# Patient Record
Sex: Male | Born: 1945 | Race: White | Hispanic: No | Smoking: Never smoker
Health system: Southern US, Community
[De-identification: ages and names within clinical notes are randomized; demographics above are authoritative.]

## PROBLEM LIST (undated history)

## (undated) DIAGNOSIS — Z978 Presence of other specified devices: Secondary | ICD-10-CM

## (undated) DIAGNOSIS — I219 Acute myocardial infarction, unspecified: Secondary | ICD-10-CM

## (undated) DIAGNOSIS — F319 Bipolar disorder, unspecified: Secondary | ICD-10-CM

## (undated) DIAGNOSIS — Z950 Presence of cardiac pacemaker: Secondary | ICD-10-CM

## (undated) DIAGNOSIS — I2510 Atherosclerotic heart disease of native coronary artery without angina pectoris: Secondary | ICD-10-CM

## (undated) DIAGNOSIS — I10 Essential (primary) hypertension: Secondary | ICD-10-CM

## (undated) DIAGNOSIS — I4589 Other specified conduction disorders: Secondary | ICD-10-CM

## (undated) DIAGNOSIS — F329 Major depressive disorder, single episode, unspecified: Secondary | ICD-10-CM

## (undated) DIAGNOSIS — F0390 Unspecified dementia without behavioral disturbance: Secondary | ICD-10-CM

## (undated) DIAGNOSIS — I77810 Thoracic aortic ectasia: Secondary | ICD-10-CM

## (undated) DIAGNOSIS — I441 Atrioventricular block, second degree: Secondary | ICD-10-CM

## (undated) DIAGNOSIS — E785 Hyperlipidemia, unspecified: Secondary | ICD-10-CM

## (undated) DIAGNOSIS — I4892 Unspecified atrial flutter: Secondary | ICD-10-CM

## (undated) DIAGNOSIS — F32A Depression, unspecified: Secondary | ICD-10-CM

## (undated) DIAGNOSIS — I4510 Unspecified right bundle-branch block: Secondary | ICD-10-CM

## (undated) HISTORY — DX: Atrioventricular block, second degree: I44.1

## (undated) HISTORY — DX: Hyperlipidemia, unspecified: E78.5

## (undated) HISTORY — DX: Essential (primary) hypertension: I10

## (undated) HISTORY — PX: KNEE SURGERY: SHX244

## (undated) HISTORY — DX: Unspecified atrial flutter: I48.92

## (undated) HISTORY — DX: Thoracic aortic ectasia: I77.810

## (undated) HISTORY — DX: Other specified conduction disorders: I45.89

## (undated) HISTORY — DX: Unspecified right bundle-branch block: I45.10

---
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
## 2012-01-16 ENCOUNTER — Ambulatory Visit (HOSPITAL_COMMUNITY): Payer: Self-pay | Admitting: Physical Therapy

## 2012-01-17 ENCOUNTER — Ambulatory Visit (HOSPITAL_COMMUNITY)
Admission: RE | Admit: 2012-01-17 | Discharge: 2012-01-17 | Disposition: A | Discharge status: Home / Self Care | Payer: Non-veteran care | Source: Ambulatory Visit | Attending: Physician Assistant | Admitting: Physician Assistant

## 2012-01-17 DIAGNOSIS — M25569 Pain in unspecified knee: Secondary | ICD-10-CM | POA: Insufficient documentation

## 2012-01-17 DIAGNOSIS — IMO0001 Reserved for inherently not codable concepts without codable children: Secondary | ICD-10-CM | POA: Insufficient documentation

## 2012-01-17 DIAGNOSIS — R262 Difficulty in walking, not elsewhere classified: Secondary | ICD-10-CM | POA: Insufficient documentation

## 2012-01-17 DIAGNOSIS — M25669 Stiffness of unspecified knee, not elsewhere classified: Secondary | ICD-10-CM | POA: Insufficient documentation

## 2012-01-17 NOTE — Evaluation (Signed)
Physical Therapy Evaluation  Patient Details  Name: Mario Massey MRN: [id number] Date of Birth: Oct 31, 1945  Today's Date: 01/17/2012 Time: 1113-1155 PT Time Calculation (min): 42 min  Visit#: 1  of 12   Re-eval: 02/16/12 Assessment Diagnosis: TKR Surgical Date: 12/19/11 Next MD Visit: 02/11/2101 Prior Therapy: HH  Authorization: [facility]  Authorization Time Period: 2-3x a week for 4-6 weeks    Past Medical History: No past medical history on file. Past Surgical History: No past surgical history on file.  Subjective Symptoms/Limitations Symptoms: Mario Massey states that he has quit taking pain medication.  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 Pertinent History: Pt carries Nitro How long can you sit comfortably?: an hour How long can you stand comfortably?: 20 minutes How long can you walk comfortably?: 5-10 minutes  Patient Stated Goals: To get on a walking program Pain Assessment Currently in Pain?: No/denies (5/10 is the max pain) Pain Orientation: Right Pain Type: Surgical pain    Prior Functioning  Home Living Lives With: Alone Type of Home: Apartment Home Access: Stairs to enter Entrance Stairs-Number of Steps: 3 Entrance Stairs-Rails: Right Prior Function Level of Independence: Independent with basic ADLs Able to Take Stairs?: No Driving: Yes Vocation: On disability Leisure: Hobbies-no  Cognition/Observation Cognition Overall Cognitive Status: Appears within functional limits for tasks assessed   Assessment RLE AROM (degrees) Right Knee Extension: 10  Right Knee Flexion: 102  RLE Strength Right Hip Flexion: 5/5 Right Hip Extension: 3/5 Right Hip ABduction: 4/5 Right Knee Flexion: 4/5 Right Knee Extension: 5/5 Right Ankle Dorsiflexion: 4/5  Exercise/Treatments    Stretches Active  Hamstring Stretch: 3 reps;30 seconds   Standing Heel Raises: 10 reps Seated Long Arc Quad: 10 reps Supine Quad Sets: 10 reps Terminal Knee Extension: 10 reps  Prone  Hamstring Curl: 10 reps Hip Extension: 10 reps   Physical Therapy Assessment and Plan PT Assessment and Plan Clinical Impression Statement: Pt with decreased ROM, decreased strength  following a TKR who will benefit from skilled therapy to improve functional activity tolerance and quality of life Pt will benefit from skilled therapeutic intervention in order to improve on the following deficits: Decreased activity tolerance;Decreased balance;Decreased range of motion;Decreased strength;Pain;Difficulty walking Rehab Potential: Good PT Frequency: Min 2X/week PT Duration: 6 weeks PT Treatment/Interventions: Gait training;Stair training;Therapeutic activities;Therapeutic exercise;Manual techniques;Modalities PT Plan: Begin Bike, rockerboard, standing flex, heel raise, minisquat, Standing and supine terminal extension and SLS next treatment.  3rd begin lateral and forward step ups.    Goals Home Exercise Program Pt will Perform Home Exercise Program: Independently PT Short Term Goals Time to Complete Short Term Goals: 2 weeks PT Short Term Goal 1: Pt ROM to be to 5 to improve gait PT Short Term Goal 2: strength to be increased by 1/2 grade PT Short Term Goal 3: Pt to be able to be able to stand for 15 minutes without difficulty PT Short Term Goal 4: Pt to be able to walk for 15 minutes PT Long Term Goals Time to Complete Long Term Goals:  (6 weeks) PT Long Term Goal 1: I in advance HEP PT Long Term Goal 2: ROM to be 3-115 degrees to allow pt to squat down and improve ambulation Long Term Goal 3: Pt to be able to stand for 20 minutes. Long Term Goal 4: Pt to be ambulating for 30 mintues  at time. PT Long Term Goal 5: able to go up and down steps reciprocally  Problem List Patient Active Problem List  Diagnosis  .  Stiffness of knee joint  . Difficulty in walking    PT - End of Session Activity Tolerance: Patient tolerated treatment well General Behavior During Session: [facility] for tasks performed PT Plan of Care PT Home Exercise Plan: given  GP    Gerry Blanchfield,CINDY 01/17/2012, 12:21 PM  Physician Documentation Your signature is required to indicate approval of the treatment plan as stated above.  Please sign and either send electronically or make a copy of this report for your files and return this physician signed original.   Please mark one 1.__approve of plan  2. ___approve of plan with the following conditions.   ______________________________                                                          _____________________ Physician Signature                                                                                                             Date

## 2012-01-20 ENCOUNTER — Inpatient Hospital Stay (HOSPITAL_COMMUNITY): Admission: RE | Admit: 2012-01-20 | Payer: Non-veteran care | Source: Ambulatory Visit | Admitting: *Deleted

## 2012-01-28 ENCOUNTER — Ambulatory Visit (HOSPITAL_COMMUNITY)
Admission: RE | Admit: 2012-01-28 | Discharge: 2012-01-28 | Disposition: A | Discharge status: Home / Self Care | Payer: Non-veteran care | Source: Ambulatory Visit | Attending: Physician Assistant | Admitting: Physician Assistant

## 2012-01-28 DIAGNOSIS — R262 Difficulty in walking, not elsewhere classified: Secondary | ICD-10-CM

## 2012-01-28 DIAGNOSIS — M25669 Stiffness of unspecified knee, not elsewhere classified: Secondary | ICD-10-CM

## 2012-01-28 NOTE — Progress Notes (Signed)
Physical Therapy Treatment Patient Details  Name: Mario Massey MRN: [id number] Date of Birth: 1945/02/20  Today's Date: 01/28/2012 Time: 7829-5621 PT Time Calculation (min): 63 min  Visit#: 2  of 12   Re-eval: 02/14/12    Authorization: [facility]  Authorization Time Period: 2-3 times a week for 4-6 weeks  Authorization Visit#:   of     Subjective: Symptoms/Limitations Symptoms: My knee is doing great.  My left knee is hurting more than my right Pain Assessment Currently in Pain?: No/denies Pain Orientation: Right   Exercise/Treatments Active Hamstring Stretch: 3 reps;30 seconds Standing Heel Raises: 10 reps Knee Flexion: 10 reps Terminal Knee Extension: Strengthening;15 reps;Theraband Theraband Level (Terminal Knee Extension): Level 4 (Blue) Functional Squat: 10 reps Rocker Board: 2 minutes SLS: 2 sec max.  5 rep Seated Long Arc Quad: 10 reps;Weights Long Arc Quad Weight: 3 lbs. Supine Quad Sets: 10 reps Heel Slides: 10 reps Terminal Knee Extension: 10 reps Knee Extension: PROM Prone  Hamstring Curl: 10 reps;Limitations Hamstring Curl Limitations: 3# Hip Extension: 10 reps;Limitations Hip Extension Limitations: 3#   Modalities Modalities: Cryotherapy Cryotherapy Number Minutes Cryotherapy: 10 Minutes Cryotherapy Location: Knee Type of Cryotherapy: Ice pack  Physical Therapy Assessment and Plan PT Assessment and Plan Clinical Impression Statement: Pt demonstrates improved ROM; significant decreased balance and tolerance to activity. PT Plan: begin lateral and forward stpe ups     Problem List Patient Active Problem List  Diagnosis  . Stiffness of knee joint  . Difficulty in walking    PT - End of Session Activity Tolerance: Patient tolerated treatment well General Behavior During Session: [facility] for tasks performed Cognition: [facility] for tasks performed  GP    RUSSELL,CINDY 01/28/2012, 3:57 PM

## 2012-01-30 ENCOUNTER — Ambulatory Visit (HOSPITAL_COMMUNITY): Payer: Non-veteran care | Admitting: *Deleted

## 2012-02-04 ENCOUNTER — Ambulatory Visit (HOSPITAL_COMMUNITY)
Admission: RE | Admit: 2012-02-04 | Discharge: 2012-02-04 | Disposition: A | Discharge status: Home / Self Care | Payer: Non-veteran care | Source: Ambulatory Visit | Attending: Physician Assistant | Admitting: Physician Assistant

## 2012-02-04 DIAGNOSIS — M25669 Stiffness of unspecified knee, not elsewhere classified: Secondary | ICD-10-CM | POA: Insufficient documentation

## 2012-02-04 DIAGNOSIS — IMO0001 Reserved for inherently not codable concepts without codable children: Secondary | ICD-10-CM | POA: Insufficient documentation

## 2012-02-04 DIAGNOSIS — M25569 Pain in unspecified knee: Secondary | ICD-10-CM | POA: Insufficient documentation

## 2012-02-04 DIAGNOSIS — R262 Difficulty in walking, not elsewhere classified: Secondary | ICD-10-CM | POA: Insufficient documentation

## 2012-02-04 NOTE — Progress Notes (Addendum)
Physical Therapy Treatment Patient Details  Name: Mario Massey MRN: [id number] Date of Birth: August 07, 1945  Today's Date: 02/04/2012 Time: 1105-1208 PT Time Calculation (min): 63 min Charge: there ex 43; IP x 10 Visit#: 3  of 12   Re-eval: 02/14/12    Authorization: [facility]  Authorization Time Period: 2-3 x wk x 4-6 weeks  Authorization Visit#:   of     Subjective: Symptoms/Limitations Symptoms: Pt states that he noticed that it is easier to get in and out of a car now.  Pt states that he went to the [place] and found out her was low on iron and he needed to lower the dosage of his blood pressure medicaion. Pain Assessment Currently in Pain?: No/denies   Exercise/Treatments   Aerobic Stationary Bike: 3.0 x 8' Standing Heel Raises: 15 reps Knee Flexion: 10 reps;Limitations Knee Flexion Limitations: 5# Terminal Knee Extension: Strengthening;10 reps;Theraband Lateral Step Up: 10 reps Forward Step Up: 10 reps Functional Squat: 10 reps Rocker Board: 2 minutes SLS: 6 sec max x 5 (6 sec x 5) Seated Long Arc Quad: 10 reps Long Arc Quad Weight: 5 lbs. Supine Quad Sets: 10 reps Heel Slides: 10 reps Terminal Knee Extension: 10 reps Knee Flexion: PROM Prone  Hamstring Curl: 10 reps Hamstring Curl Limitations: 5# Hip Extension: 5 reps Hip Extension Limitations: 5 xw/0# 5 w/5#   Modalities Modalities: Cryotherapy Cryotherapy Number Minutes Cryotherapy: 10 Minutes Cryotherapy Location: Knee  Physical Therapy Assessment and Plan PT Assessment and Plan Clinical Impression Statement: ROM 0-110; pt main concern is balance at this time PT Frequency: Min 2X/week PT Plan: begin steps in the department and tandem stance     Goals Home Exercise Program PT Goal: Perform Home Exercise Program - Progress: Met PT Short Term Goals PT Short Term Goal 1 - Progress: Met PT Short Term Goal 2 - Progress: Met PT Short Term Goal 3 - Progress: Met PT Short Term Goal 4 - Progress: Progressing  toward goal PT Long Term Goals PT Long Term Goal 1 - Progress: Met PT Long Term Goal 2 - Progress: Progressing toward goal Long Term Goal 3 Progress: Progressing toward goal Long Term Goal 4 Progress: Progressing toward goal Long Term Goal 5 Progress: Progressing toward goal  Problem List Patient Active Problem List  Diagnosis  . Stiffness of knee joint  . Difficulty in walking    PT - End of Session Activity Tolerance: Patient tolerated treatment well General Behavior During Session: [facility] for tasks performed Cognition: [facility] for tasks performed  GP    RUSSELL,CINDY 02/04/2012, 12:02 PM

## 2012-02-06 ENCOUNTER — Ambulatory Visit (HOSPITAL_COMMUNITY)
Admission: RE | Admit: 2012-02-06 | Discharge: 2012-02-06 | Disposition: A | Discharge status: Home / Self Care | Payer: Non-veteran care | Source: Ambulatory Visit | Attending: Physician Assistant | Admitting: Physician Assistant

## 2012-02-06 NOTE — Progress Notes (Signed)
Physical Therapy Treatment Patient Details  Name: Mario Massey MRN: [id number] Date of Birth: February 26, 1945  Today's Date: 02/06/2012 Time: 0865-7846 PT Time Calculation (min): 53 min Charges: 54' TE, 1 ice Visit#: 4  of 12   Re-eval: 02/14/12    Authorization: [facility]  Authorization Time Period: 2-3 x wk x 4-6 weeks  Authorization Visit#:   of     Subjective: Symptoms/Limitations Symptoms: Pt reports that he had a gout flare up for 2 days and was able to take medication and now he feels marvelous.   Pain Assessment Currently in Pain?: No/denies  Precautions/Restrictions     Exercise/Treatments Machines for Strengthening Cybex Knee Extension: 2PL ecc lower w/R 2x10 Cybex Knee Flexion: 4 PL BLE 2x10 Standing Lateral Step Up: Right;10 reps;Hand Hold: 0;Step Height: 4" Forward Step Up: Right;10 reps;Hand Hold: 0;Step Height: 6" Step Down: Right;10 reps;Hand Hold: 0;Step Height: 4" Functional Squat: Limitations;15 reps Functional Squat Limitations: w/green ball Rocker Board: 2 minutes SLS: 3x30 sec intermittent HHA  Best time 10 sec  Other Standing Knee Exercises: Tandem Stance 2x30 sec; Tandem gait and retro gait 2 RT Sidelying Hip ABduction: Right;10 reps;Limitations Hip ABduction Limitations: 2# w/manual facilitation for proper movement Prone  Hamstring Curl: 15 reps Hamstring Curl Limitations: 6# Hip Extension: Right;10 reps   Modalities Modalities: Cryotherapy Cryotherapy Number Minutes Cryotherapy: 10 Minutes Cryotherapy Location: Knee  Physical Therapy Assessment and Plan PT Assessment and Plan Clinical Impression Statement: Added activities to improve confidence with balance and improve LE functional strength. Able to complete all activities without increased pain. Able to demo R SLS x10 sec on static surface.  PT Frequency: Min 2X/week PT Plan: Continue to progress towards functional goals.     Goals    Problem List Patient Active Problem List  Diagnosis    . Stiffness of knee joint  . Difficulty in walking    PT - End of Session Activity Tolerance: Patient tolerated treatment well General Behavior During Session: [facility] for tasks performed Cognition: [facility] for tasks performed PT Plan of Care PT Patient Instructions: discussed importance of HEP 2x/day Consulted and Agree with Plan of Care: Patient  GP    Nivan Melendrez 02/06/2012, 11:55 AM

## 2012-02-11 ENCOUNTER — Ambulatory Visit (HOSPITAL_COMMUNITY)
Admission: RE | Admit: 2012-02-11 | Discharge: 2012-02-11 | Disposition: A | Discharge status: Home / Self Care | Payer: Non-veteran care | Source: Ambulatory Visit | Attending: Physician Assistant | Admitting: Physician Assistant

## 2012-02-11 DIAGNOSIS — M25669 Stiffness of unspecified knee, not elsewhere classified: Secondary | ICD-10-CM

## 2012-02-11 DIAGNOSIS — R262 Difficulty in walking, not elsewhere classified: Secondary | ICD-10-CM

## 2012-02-11 NOTE — Progress Notes (Signed)
Physical Therapy Treatment Patient Details  Name: Georgie Haque MRN: [id number] Date of Birth: [birth year]  Today's Date: 02/11/2012 Time: 1106-1212 PT Time Calculation (min): 66 min Charge:  There ex x 53; IP x 10 Visit#: 5  of 12   Re-eval: 02/14/12   Authorization: [facility]  Authorization Time Period: 2-3 x wk x 4-6 wk   Subjective: Symptoms/Limitations Symptoms: Pt concerned about his ROM; states he is having some hip pain today and not sure why Pain Assessment Currently in Pain?: Yes Pain Score: 0-No pain   Exercise/Treatments Stretches Quad Stretch: Limitations Lobbyist Limitations: chair stretch x 30" x 3 Aerobic Stationary Bike: 3.0 x 6'   Standing Heel Raises: 15 reps Knee Flexion: 15 reps;Limitations Knee Flexion Limitations: 5# Lateral Step Up: Right;10 reps;Hand Hold: 0;Step Height: 6" Forward Step Up: Right;10 reps;Hand Hold: 0;Step Height: 6" Rocker Board: 2 minutes SLS with Vectors: 10" x 3 Other Standing Knee Exercises: Tandem Stance 2x30 sec; Tandem gait and retro gait 2 RT   Supine Quad Sets: 15 reps Heel Slides: 15 reps Terminal Knee Extension: 15 reps Knee Extension: PROM Knee Flexion: PROM  Physical Therapy Assessment and Plan PT Assessment and Plan Clinical Impression Statement: Pt concerned about ROM and balance; ROM 5-118 explained that this is very good.   PT Plan: PT to begin walking up steps.    Goals Home Exercise Program PT Goal: Perform Home Exercise Program - Progress: Met PT Short Term Goals PT Short Term Goal 1 - Progress: Met PT Short Term Goal 2 - Progress: Met PT Short Term Goal 3 - Progress: Met PT Short Term Goal 4 - Progress: Met PT Long Term Goals PT Long Term Goal 1 - Progress: Met PT Long Term Goal 2 - Progress: Progressing toward goal Long Term Goal 3 Progress: Met Long Term Goal 4 Progress: Progressing toward goal Long Term Goal 5 Progress: Progressing toward goal  Problem List Patient Active Problem List    Diagnosis  . Stiffness of knee joint  . Difficulty in walking    PT - End of Session Activity Tolerance: Patient tolerated treatment well General Behavior During Session: [facility] for tasks performed Cognition: [facility] for tasks performed  GP    RUSSELL,CINDY 02/11/2012, 12:07 PM

## 2012-02-13 ENCOUNTER — Ambulatory Visit (HOSPITAL_COMMUNITY)
Admission: RE | Admit: 2012-02-13 | Discharge: 2012-02-13 | Disposition: A | Discharge status: Home / Self Care | Payer: Non-veteran care | Source: Ambulatory Visit | Attending: Physician Assistant | Admitting: Physician Assistant

## 2012-02-13 DIAGNOSIS — M25669 Stiffness of unspecified knee, not elsewhere classified: Secondary | ICD-10-CM

## 2012-02-13 DIAGNOSIS — R262 Difficulty in walking, not elsewhere classified: Secondary | ICD-10-CM

## 2012-02-13 NOTE — Progress Notes (Signed)
Physical Therapy Treatment Patient Details  Name: Mario Massey MRN: [id number] Date of Birth: April 18, 1945  Today's Date: 02/13/2012 Time: 1104-1208 PT Time Calculation (min): 64 min  Visit#: 6  of 12   Re-eval: 02/17/12  charge there ex x 53; IP x 10  Authorization: [facility]  Authorization Time Period: 2x week x 6 week  Authorization Visit#:   of     Subjective: Symptoms/Limitations Symptoms: Pt states his balance is still the most limiting aspect.   Exercise/Treatments  Stretches Lobbyist: 3 reps;30 seconds;Limitations (w/rope and on chair) Lobbyist Limitations: chair stretch x 30" x 3 Aerobic Stationary Bike: 3.0 x 7' Standing Heel Raises: 15 reps Knee Flexion: 15 reps;Limitations Knee Flexion Limitations: 5# Lateral Step Up: Right;10 reps;Hand Hold: 0;Step Height: 6" Forward Step Up: Right;10 reps;Hand Hold: 0;Step Height: 6" Rocker Board: 2 minutes SLS: 5x 10" SLS with Vectors: 10" x 3 Other Standing Knee Exercises: Tandem Stance 2x30 sec; Tandem gait and retro gait 2 RT    Supine Quad Sets: 15 reps Heel Slides: 15 reps Terminal Knee Extension: 15 reps Knee Extension: PROM Knee Flexion: PROM   Prone  Hip Extension: 15 reps  Modalities Modalities: Cryotherapy Cryotherapy Number Minutes Cryotherapy: 15 Minutes Cryotherapy Location: Knee Type of Cryotherapy: Ice pack  Physical Therapy Assessment and Plan PT Assessment and Plan Clinical Impression Statement: Pt completed steps reciprocally today with slight difficulty on descent.  Pt slowly  improving with balance.  Pt still has decreased hip extensor strength. PT Plan: continue with balance begin tandem walking; sit to stand        Problem List Patient Active Problem List  Diagnosis  . Stiffness of knee joint  . Difficulty in walking    PT - End of Session Activity Tolerance: Patient tolerated treatment well General Behavior During Session: [facility] for tasks performed Cognition: [facility] for tasks  performed PT Plan of Care PT Home Exercise Plan: given  GP    Guelda Batson,CINDY 02/13/2012, 12:03 PM

## 2012-02-18 ENCOUNTER — Ambulatory Visit (HOSPITAL_COMMUNITY)
Admission: RE | Admit: 2012-02-18 | Discharge: 2012-02-18 | Disposition: A | Discharge status: Home / Self Care | Payer: Non-veteran care | Source: Ambulatory Visit | Attending: Physician Assistant | Admitting: Physician Assistant

## 2012-02-18 NOTE — Progress Notes (Signed)
Physical Therapy Treatment Patient Details  Name: Mario Massey MRN: [id number] Date of Birth: 02-20-1945  Today's Date: 02/18/2012 Time: 1106-1200 PT Time Calculation (min): 54 min  Visit#: 7  of 12   Re-eval: 02/17/12 Charges: Therex x 30' NMR x 10' Ice x 10'  Authorization: [facility]  Authorization Time Period: 2x week x 6 week    Subjective: Symptoms/Limitations Symptoms: Pt reports HEP compliance. Pain Assessment Currently in Pain?: No/denies   Exercise/Treatments  Stretches Lobbyist Limitations: chair stretch 3x30" Aerobic Stationary Bike: 8'@3 .0 seat 11 for strength and ROM Standing Heel Raises: 20 reps Lateral Step Up: 15 reps;Right;Step Height: 6" Forward Step Up: 15 reps;Right;Step Height: 6" Rocker Board: 2 minutes SLS with Vectors: 5x10" Other Standing Knee Exercises: Tandem gait and retro tandem gait 2 RT Supine Knee Extension: PROM Knee Flexion: PROM   Modalities Modalities: Cryotherapy Cryotherapy Number Minutes Cryotherapy: 10 Minutes Cryotherapy Location: Knee (Right) Type of Cryotherapy: Ice pack  Physical Therapy Assessment and Plan PT Assessment and Plan Clinical Impression Statement: Pt requires vc's for proper technique with step ups. Pt's balance continues to improve. Pt had multiple LOB but was able to recover independently. Pt displays improved stability with rocker board. Ice applied to R knee at end of session to limit pain and swelling. Pt reports 0/10 pain at end of session. PT Plan: Continue to progress per PT POC. Begin sit to stand at end of session.     Problem List Patient Active Problem List  Diagnosis  . Stiffness of knee joint  . Difficulty in walking    PT - End of Session Activity Tolerance: Patient tolerated treatment well General Behavior During Session: [facility] for tasks performed Cognition: [facility] for tasks performed  Seth Bake, PTA 02/18/2012, 12:08 PM

## 2012-02-20 ENCOUNTER — Ambulatory Visit (HOSPITAL_COMMUNITY)
Admission: RE | Admit: 2012-02-20 | Discharge: 2012-02-20 | Disposition: A | Discharge status: Home / Self Care | Payer: Non-veteran care | Source: Ambulatory Visit | Attending: Physician Assistant | Admitting: Physician Assistant

## 2012-02-20 DIAGNOSIS — R262 Difficulty in walking, not elsewhere classified: Secondary | ICD-10-CM

## 2012-02-20 DIAGNOSIS — M25669 Stiffness of unspecified knee, not elsewhere classified: Secondary | ICD-10-CM

## 2012-02-20 NOTE — Progress Notes (Signed)
Physical Therapy Treatment Patient Details  Name: Mario Massey MRN: [id number] Date of Birth: March 14, 1945  Today's Date: 02/20/2012 Time: 1100-1208 PT Time Calculation (min): 68 min  Visit#: 8  of 12   Re-eval: 02/27/12  charge:  There ex 33; Gt 10; IP x 10  Authorization: [facility]  2x week x 6 week.  Subjective: Symptoms/Limitations Symptoms: Pt states he is just feeling blah; states it's not his knee probably the weather.   Exercise/Treatments     Stretches Lobbyist: 3 reps;30 seconds Lobbyist Limitations: chair stretch 3x30" Gastroc Stretch: Limitations Theme park manager Limitations: slant board 3 x 30" Aerobic Stationary Bike: 8'@3 .0 seat 11 for strength and ROM Machines for Strengthening Cybex Knee Extension: 3 PL eccentric Cybex Knee Flexion: 3 Pl R only eccentrically   Standing Rocker Board: 2 minutes SLS with Vectors: 5x10" Other Standing Knee Exercises: Tandem gait and retro tandem gait 2 RT Other Standing Knee Exercises: steps at stairwell x 2 FL; R foot on chair lunnge forward. Supine Knee Extension: PROM Knee Flexion: PROM  Prone  Other Prone Exercises: terminal extension x 10 Other Prone Exercises: PROM   Physical Therapy Assessment and Plan PT Assessment and Plan Clinical Impression Statement: Pt ROM 0-121; Pt had no LOB w/ tandem walking tday.  Pt improving w/ stairclimbing. PT Frequency: Min 2X/week PT Duration: 6 weeks PT Plan: Pt to begin  sti to stand next treatment.      Goals Home Exercise Program PT Goal: Perform Home Exercise Program - Progress: Met PT Short Term Goals PT Short Term Goal 1 - Progress: Met PT Short Term Goal 2 - Progress: Met PT Short Term Goal 3 - Progress: Met PT Short Term Goal 4 - Progress: Met PT Long Term Goals PT Long Term Goal 1 - Progress: Met PT Long Term Goal 2 - Progress: Met Long Term Goal 3 Progress: Met Long Term Goal 5 Progress: Met  Problem List Patient Active Problem List  Diagnosis  .  Stiffness of knee joint  . Difficulty in walking       GP    Eimy Plaza,CINDY 02/20/2012, 12:02 PM

## 2012-02-25 ENCOUNTER — Ambulatory Visit (HOSPITAL_COMMUNITY)
Admission: RE | Admit: 2012-02-25 | Discharge: 2012-02-25 | Disposition: A | Discharge status: Home / Self Care | Payer: Non-veteran care | Source: Ambulatory Visit | Attending: Physician Assistant | Admitting: Physician Assistant

## 2012-02-25 NOTE — Progress Notes (Signed)
Physical Therapy Treatment Patient Details  Name: Mario Massey MRN: [id number] Date of Birth: March 30, 1945  Today's Date: 02/25/2012 Time: 1100-1158 PT Time Calculation (min): 58 min  Visit#: 9  of 12   Re-eval: 02/27/12 Charges: Therex x 42' Ice x 10'  Authorization: [facility]    Subjective: Symptoms/Limitations Symptoms: Pt reports HEP compliance. Pain Assessment Currently in Pain?: No/denies Pain Score: 0-No pain   Exercise/Treatments Aerobic Stationary Bike: 8'@3 .0 seat 11 for strength and ROM Machines for Strengthening Cybex Knee Extension: 3 PL R eccentric lowering x 15 Cybex Knee Flexion: 3 Pl R only x15 Standing Rocker Board: 2 minutes SLS with Vectors: 5x10" Other Standing Knee Exercises: Tandem gait and retro tandem gait 2 RT   Modalities Modalities: Cryotherapy Cryotherapy Number Minutes Cryotherapy: 10 Minutes Cryotherapy Location: Knee (Right) Type of Cryotherapy: Ice pack  Physical Therapy Assessment and Plan PT Assessment and Plan Clinical Impression Statement: Pt appears to be progressing well. Pt displays improve strength and power. Pt continues to have difficulty with balance and stability. Ice applied to B knees at end of session to limit pain and inflammation. PT Plan: Reassess next session.     Problem List Patient Active Problem List  Diagnosis  . Stiffness of knee joint  . Difficulty in walking    PT - End of Session Activity Tolerance: Patient tolerated treatment well General Behavior During Session: [facility] for tasks performed Cognition: [facility] for tasks performed  Seth Bake, PTA 02/25/2012, 12:11 PM

## 2012-02-27 ENCOUNTER — Ambulatory Visit (HOSPITAL_COMMUNITY)
Admission: RE | Admit: 2012-02-27 | Discharge: 2012-02-27 | Disposition: A | Discharge status: Home / Self Care | Payer: Non-veteran care | Source: Ambulatory Visit | Attending: Physician Assistant | Admitting: Physician Assistant

## 2012-02-27 DIAGNOSIS — R262 Difficulty in walking, not elsewhere classified: Secondary | ICD-10-CM

## 2012-02-27 DIAGNOSIS — M25669 Stiffness of unspecified knee, not elsewhere classified: Secondary | ICD-10-CM

## 2012-02-27 NOTE — Evaluation (Signed)
Physical Therapy Reassessment Patient Details  Name: Mario Massey MRN: [id number] Date of Birth: 1945/05/18  Today's Date: 02/27/2012 Time: 1102-1148 PT Time Calculation (min): 46 min  Visit#: 10  of 10   Charge: MMTest, ROM, there ex x 28 Past Medical History: No past medical history on file. Past Surgical History: No past surgical history on file.  Subjective Symptoms/Limitations Symptoms: Pt states no pain in his knee just some stiffness How long can you sit comfortably?: Pt is able to sit for three hours at a time to travel.  How long can you stand comfortably?: The patient is able to stand for 30 minutes at a timel How long can you walk comfortably?: The patient is able to walk for 35 min at a time. now.   Pain Assessment Currently in Pain?: No/denies Pain Score: 0-No pain   Prior Functioning  Home Living Lives With: Alone Type of Home: Apartment Home Access: Stairs to enter Entrance Stairs-Number of Steps: 3 Entrance Stairs-Rails: Right Prior Function Level of Independence: Independent with basic ADLs Able to Take Stairs?: No Driving: Yes Vocation: On disability Leisure: Hobbies-no  Cognition/Observation Cognition Overall Cognitive Status: Appears within functional limits for tasks assessed  Assessment RLE AROM (degrees) Right Knee Extension: 2  (was 10) Right Knee Flexion: 120  RLE Strength Right Hip Flexion: 5/5 Right Hip Extension: 3/5 (L is 3+/5) Right Hip ABduction: 5/5 Right Knee Flexion: 5/5 (was 4/5) Right Knee Extension: 5/5 Right Ankle Dorsiflexion: 5/5  Exercise/Treatments   Aerobic Stationary Bike: 8' @ 3.0 Standing Other Standing Knee Exercises: 3 fl steps   Supine Quad Sets: 10 reps Bridges: 15 reps Prone  Hip Extension: 15 reps    Physical Therapy Assessment and Plan PT Assessment and Plan Clinical Impression Statement: Pt able to complete all functional tasks at home without pain.  States thtat his only difficulty is going  down steps reciprocally; (he is able to do this it is just difficult).  Pt only objective finding is weak hip extensors and this is B.  PT advised to complete prone hip extension and supine bridging as HEP PT Plan: D/C pt.    Goals Home Exercise Program PT Goal: Perform Home Exercise Program - Progress: Met PT Short Term Goals PT Short Term Goal 2 - Progress: Partly met (all mm strength increased to wnl except for except hip extensors ) PT Short Term Goal 3 - Progress: Met PT Short Term Goal 4 - Progress: Met PT Long Term Goals PT Long Term Goal 1 - Progress: Met Long Term Goal 3 Progress: Met Long Term Goal 5 Progress: Met  Problem List Patient Active Problem List  Diagnosis  . Stiffness of knee joint  . Difficulty in walking    PT - End of Session Activity Tolerance: Patient tolerated treatment well General Behavior During Session: [facility] for tasks performed Cognition: [facility] for tasks performed PT Plan of Care PT Home Exercise Plan: updated  GP    Ashley Bultema,CINDY 02/27/2012, 1:21 PM  Physician Documentation Your signature is required to indicate approval of the treatment plan as stated above.  Please sign and either send electronically or make a copy of this report for your files and return this physician signed original.   Please mark one 1.__approve of plan  2. ___approve of plan with the following conditions.   ______________________________  _____________________ Physician Signature                                                                                                             Date

## 2012-06-07 ENCOUNTER — Emergency Department (HOSPITAL_COMMUNITY)
Admission: EM | Admit: 2012-06-07 | Discharge: 2012-06-07 | Disposition: A | Discharge status: Home / Self Care | Payer: Non-veteran care | Attending: Emergency Medicine | Admitting: Emergency Medicine

## 2012-06-07 ENCOUNTER — Emergency Department (HOSPITAL_COMMUNITY): Payer: Non-veteran care

## 2012-06-07 ENCOUNTER — Encounter (HOSPITAL_COMMUNITY): Payer: Self-pay | Admitting: *Deleted

## 2012-06-07 DIAGNOSIS — F319 Bipolar disorder, unspecified: Secondary | ICD-10-CM | POA: Insufficient documentation

## 2012-06-07 DIAGNOSIS — Z1159 Encounter for screening for other viral diseases: Secondary | ICD-10-CM | POA: Insufficient documentation

## 2012-06-07 DIAGNOSIS — I2510 Atherosclerotic heart disease of native coronary artery without angina pectoris: Secondary | ICD-10-CM | POA: Insufficient documentation

## 2012-06-07 DIAGNOSIS — IMO0001 Reserved for inherently not codable concepts without codable children: Secondary | ICD-10-CM | POA: Insufficient documentation

## 2012-06-07 DIAGNOSIS — IMO0002 Reserved for concepts with insufficient information to code with codable children: Secondary | ICD-10-CM | POA: Insufficient documentation

## 2012-06-07 DIAGNOSIS — R110 Nausea: Secondary | ICD-10-CM | POA: Insufficient documentation

## 2012-06-07 DIAGNOSIS — F3289 Other specified depressive episodes: Secondary | ICD-10-CM | POA: Insufficient documentation

## 2012-06-07 DIAGNOSIS — F329 Major depressive disorder, single episode, unspecified: Secondary | ICD-10-CM | POA: Insufficient documentation

## 2012-06-07 DIAGNOSIS — Z79899 Other long term (current) drug therapy: Secondary | ICD-10-CM | POA: Insufficient documentation

## 2012-06-07 DIAGNOSIS — R52 Pain, unspecified: Secondary | ICD-10-CM | POA: Insufficient documentation

## 2012-06-07 DIAGNOSIS — R630 Anorexia: Secondary | ICD-10-CM | POA: Insufficient documentation

## 2012-06-07 DIAGNOSIS — R51 Headache: Secondary | ICD-10-CM | POA: Insufficient documentation

## 2012-06-07 DIAGNOSIS — Z9189 Other specified personal risk factors, not elsewhere classified: Secondary | ICD-10-CM

## 2012-06-07 DIAGNOSIS — I10 Essential (primary) hypertension: Secondary | ICD-10-CM | POA: Insufficient documentation

## 2012-06-07 DIAGNOSIS — R000 Tachycardia, unspecified: Secondary | ICD-10-CM | POA: Insufficient documentation

## 2012-06-07 HISTORY — DX: Bipolar disorder, unspecified: F31.9

## 2012-06-07 HISTORY — DX: Atherosclerotic heart disease of native coronary artery without angina pectoris: I25.10

## 2012-06-07 HISTORY — DX: Major depressive disorder, single episode, unspecified: F32.9

## 2012-06-07 HISTORY — DX: Depression, unspecified: F32.A

## 2012-06-07 HISTORY — DX: Essential (primary) hypertension: I10

## 2012-06-07 LAB — CBC WITH DIFFERENTIAL/PLATELET
Eosinophils Relative: 0 % (ref 0–5)
HCT: 41.2 % (ref 39.0–52.0)
Hemoglobin: 14.7 g/dL (ref 13.0–17.0)
Lymphocytes Relative: 9 % — ABNORMAL LOW (ref 12–46)
Lymphs Abs: 0.4 10*3/uL — ABNORMAL LOW (ref 0.7–4.0)
MCV: 88 fL (ref 78.0–100.0)
Monocytes Absolute: 0.2 10*3/uL (ref 0.1–1.0)
Monocytes Relative: 4 % (ref 3–12)
RBC: 4.68 MIL/uL (ref 4.22–5.81)
WBC: 4.2 10*3/uL (ref 4.0–10.5)

## 2012-06-07 LAB — URINALYSIS, ROUTINE W REFLEX MICROSCOPIC
Glucose, UA: NEGATIVE mg/dL
Protein, ur: NEGATIVE mg/dL

## 2012-06-07 LAB — TROPONIN I: Troponin I: <0.3 ng/mL (ref <0.30)

## 2012-06-07 LAB — BASIC METABOLIC PANEL
BUN: 13 mg/dL (ref 6–23)
CO2: 25 mEq/L (ref 19–32)
Calcium: 8.9 mg/dL (ref 8.4–10.5)
Glucose, Bld: 119 mg/dL — ABNORMAL HIGH (ref 70–99)
Sodium: 127 mEq/L — ABNORMAL LOW (ref 135–145)

## 2012-06-07 LAB — URINE MICROSCOPIC-ADD ON

## 2012-06-07 MED ORDER — ACETAMINOPHEN 325 MG PO TABS
650.0000 mg | ORAL_TABLET | Freq: Once | ORAL | Status: AC
  Administered 2012-06-07: 650 mg via ORAL
  Filled 2012-06-07: qty 2

## 2012-06-07 MED ORDER — DOXYCYCLINE HYCLATE 100 MG PO CAPS: 100.0000 mg | ORAL_CAPSULE | Freq: Two times a day (BID) | ORAL | Status: DC

## 2012-06-07 MED ORDER — DOXYCYCLINE HYCLATE 100 MG PO TABS
100.0000 mg | ORAL_TABLET | Freq: Once | ORAL | Status: AC
  Administered 2012-06-07: 100 mg via ORAL
  Filled 2012-06-07: qty 1

## 2012-06-07 MED ORDER — ONDANSETRON HCL 4 MG/2ML IJ SOLN
4.0000 mg | Freq: Once | INTRAMUSCULAR | Status: AC
  Administered 2012-06-07: 4 mg via INTRAVENOUS
  Filled 2012-06-07: qty 2

## 2012-06-07 MED ORDER — SODIUM CHLORIDE 0.9 % IV SOLN
Freq: Once | INTRAVENOUS | Status: AC
  Administered 2012-06-07: 11:00:00 via INTRAVENOUS

## 2012-06-07 NOTE — ED Notes (Signed)
Placed on telemetry, HR SR at 95

## 2012-06-07 NOTE — ED Notes (Signed)
Pt presents to er with c/o generalized weakness, chills, no appetite, headaches, felt like his heart was beating faster than normal. Pt reports that he removed a tick that has bitten him on Tuesday, symptoms started afterwards.

## 2012-06-07 NOTE — ED Provider Notes (Signed)
History     CSN: [id number]  Arrival date & time 06/07/12  1043   First MD Initiated Contact with Patient 06/07/12 1051      Chief Complaint  Patient presents with  . Tick Removal  . Tachycardia  . Fatigue    (Consider location/radiation/quality/duration/timing/severity/associated sxs/prior treatment) HPI Comments: Mario Massey is a 67 yo male that presents to the ED with multiple complaints.  States that he has been having episodes of "fast heart beat", fatigue, body aches, generalized weakness and decreased appetite for several days.  States the symptoms began after removing a tick from his left arm.  He denies shortness of breath, chest pain, vomiting or fever.    The history is provided by the patient (daughter).    Past Medical History  Diagnosis Date  . Hypertension   . Coronary artery disease   . Bipolar disorder   . Depression     Past Surgical History  Procedure Laterality Date  . 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  History  Substance Use Topics  . Smoking status: Never Smoker   . Smokeless tobacco: Not on file  . Alcohol Use: Yes      Review of Systems  Constitutional: Positive for appetite change and fatigue. Negative for fever, chills and activity change.  HENT: Negative for facial swelling, trouble swallowing, neck pain and neck stiffness.   Eyes: Negative for visual disturbance.  Respiratory: Negative for cough, chest tightness, shortness of breath and stridor.   Cardiovascular: Negative for chest pain, palpitations and leg swelling.  Gastrointestinal: Positive for nausea. Negative for vomiting, abdominal pain and blood in stool.  Genitourinary: Negative for dysuria.  Musculoskeletal: Positive for myalgias. Negative for arthralgias.  Neurological: Positive for weakness and headaches. Negative for dizziness, tremors, seizures, syncope, facial asymmetry, speech difficulty and numbness.  Hematological:  Negative for adenopathy.  Psychiatric/Behavioral: Negative for confusion and decreased concentration.  All other systems reviewed and are negative.    Allergies  Review of patient's allergies indicates no known allergies.  Home Medications   Current Outpatient Rx  Name  Route  Sig  Dispense  Refill  . doxazosin (CARDURA) 8 MG tablet   Oral   Take 8 mg by mouth at bedtime.         . finasteride (PROSCAR) 5 MG tablet   Oral   Take 5 mg by mouth daily.         Marland Kitchen lisinopril (PRINIVIL,ZESTRIL) 5 MG tablet   Oral   Take 5 mg by mouth daily.         . metoprolol tartrate (LOPRESSOR) 25 MG tablet   Oral   Take 12.5 mg by mouth 2 (two) times daily.         . nitroGLYCERIN (NITROSTAT) 0.4 MG SL tablet   Sublingual   Place 0.4 mg under the tongue every 5 (five) minutes as needed for chest pain.         . predniSONE (DELTASONE) 10 MG tablet   Oral   Take 10 mg by mouth daily.         . tamsulosin (FLOMAX) 0.4 MG CAPS   Oral   Take 0.4 mg by mouth daily.         Marland Kitchen venlafaxine XR (EFFEXOR-XR) 150 MG 24 hr capsule   Oral   Take 150 mg by mouth daily.           BP  101/58  Pulse 81  Temp(Src) 100.8 F (38.2 C)  Resp 26  Ht 6' (1.829 m)  Wt 263 lb (119.296 kg)  BMI 35.66 kg/m2  SpO2 91%  Physical Exam  Nursing note and vitals reviewed. Constitutional: He is oriented to person, place, and time. He appears well-developed and well-nourished. No distress.  HENT:  Head: Normocephalic and atraumatic.  Mouth/Throat: Oropharynx is clear and moist.  Eyes: Conjunctivae and EOM are normal. Pupils are equal, round, and reactive to light.  Neck: Normal range of motion and phonation normal. Neck supple. No Brudzinski's sign and no Kernig's sign noted.  Cardiovascular: Normal rate, regular rhythm, normal heart sounds and intact distal pulses.   No murmur heard. Pulmonary/Chest: Effort normal and breath sounds normal. No respiratory distress. He exhibits no  tenderness.  Abdominal: Soft. He exhibits no distension and no mass. There is no tenderness. There is no rebound and no guarding.  Musculoskeletal: Normal range of motion. He exhibits no edema and no tenderness.  Lymphadenopathy:    He has no cervical adenopathy.  Neurological: He is alert and oriented to person, place, and time. He exhibits normal muscle tone. Coordination normal.  Skin: Skin is warm and dry.  Small erythematous papules to the left forearm.  No rashes or edema.  Pt also has a tick attached to skin of the mid back, without surrounding erythema or rash    ED Course  Procedures (including critical care time)  Labs Reviewed  CBC WITH DIFFERENTIAL - Abnormal; Notable for the following:    Platelets 93 (*)    Neutrophils Relative 87 (*)    Lymphocytes Relative 9 (*)    Lymphs Abs 0.4 (*)    All other components within normal limits  BASIC METABOLIC PANEL - Abnormal; Notable for the following:    Sodium 127 (*)    Chloride 92 (*)    Glucose, Bld 119 (*)    GFR calc non Af Amer 89 (*)    All other components within normal limits  URINALYSIS, ROUTINE W REFLEX MICROSCOPIC - Abnormal; Notable for the following:    Hgb urine dipstick TRACE (*)    Ketones, ur 15 (*)    All other components within normal limits  TROPONIN I  URINE MICROSCOPIC-ADD ON   Dg Chest Portable 1 View  06/07/2012  *RADIOLOGY REPORT*  Clinical Data: Chest pain, shortness of breath, tachycardia  PORTABLE CHEST - 1 VIEW  Comparison: None.  Findings: Cardiac leads overlie the chest.  Heart size is normal. Lung volumes are low but clear.  No pleural effusion.  No acute osseous finding.  Apparent prominence of the cardiomediastinal silhouette is likely in part due to AP portable technique. Fine detail is obscured by patient body habitus.  IMPRESSION: No acute cardiopulmonary process allowing for portable technique and habitus. If the patient's symptoms continue, consider PA and lateral chest radiographs  obtained at full inspiration when the patient is clinically able.   Original Report Authenticated By: Christiana Pellant, M.D.         MDM   Date: 06/07/2012  Rate: 91  Rhythm: normal sinus rhythm  QRS Axis: normal  Intervals: normal  ST/T Wave abnormalities: normal  Conduction Disutrbances:incomplete RBBB  Narrative Interpretation:   Old EKG Reviewed: none available    EKG read by Dr. Judd Lien    Previous medical charts were reviewed, the nursing notes and vitals signs from this visit were also reviewed by me   All laboratory results and/or imaging results performed on  this visit, if applicable, were reviewed by me and discussed with the patient and/or parent as well as recommendation for follow-up    MEDICATIONS GIVEN IN ED:  IVF's, zofran  Tick removed from the mid back by me   No surrounding erythema noted.  Removed by me using forceps without difficulty.  Clinical concern for tick related illness, given hyponatremia, thrombocytopenia, and slightly diminished WBC   Patient is feeling better and medications and IVF's.  Ambulated w/o difficulty and ate a meal tray prior to d/c   PRESCRIPTIONS GIVEN AT DISCHARGE:  doxycycline   Pt stable in ED with no significant deterioration in condition. Pt feels improved after observation and/or treatment in ED. Patient / Family / Caregiver understand and agree with initial ED impression and plan with expectations set for ED visit.  Patient agrees to return to ED for any worsening symptoms   The patient appears reasonably screened and/or stabilized for discharge and I doubt any other medical condition or other [facility] requiring further screening, evaluation, or treatment in the ED at this time prior to discharge.        Cornell Bourbon L. Trisha Mangle, PA-C 06/10/12 2134

## 2012-06-07 NOTE — ED Notes (Signed)
Pt stood up to attempt to use urinal, noted a tick attached to mid back, T. Triplett, PA made aware and tick removed

## 2012-06-07 NOTE — ED Notes (Signed)
T. Triplett, PA at bedside. 

## 2012-06-07 NOTE — ED Notes (Signed)
Pt removed a small tick from left arm recently, since HA off and on to forehead, gen. Weakness/body aches and fever

## 2012-06-11 NOTE — ED Provider Notes (Signed)
Medical screening examination/treatment/procedure(s) were performed by non-physician practitioner and as supervising physician I was immediately available for consultation/collaboration.  Javarie Crisp, MD 06/11/12 1329 

## 2013-12-01 ENCOUNTER — Emergency Department (HOSPITAL_COMMUNITY): Payer: Non-veteran care

## 2013-12-01 ENCOUNTER — Emergency Department (HOSPITAL_COMMUNITY)
Admission: EM | Admit: 2013-12-01 | Discharge: 2013-12-01 | Disposition: A | Discharge status: Home / Self Care | Payer: Non-veteran care | Attending: Emergency Medicine | Admitting: Emergency Medicine

## 2013-12-01 ENCOUNTER — Encounter (HOSPITAL_COMMUNITY): Payer: Self-pay | Admitting: Emergency Medicine

## 2013-12-01 DIAGNOSIS — I10 Essential (primary) hypertension: Secondary | ICD-10-CM | POA: Diagnosis not present

## 2013-12-01 DIAGNOSIS — R531 Weakness: Secondary | ICD-10-CM | POA: Diagnosis not present

## 2013-12-01 DIAGNOSIS — I2510 Atherosclerotic heart disease of native coronary artery without angina pectoris: Secondary | ICD-10-CM | POA: Diagnosis not present

## 2013-12-01 DIAGNOSIS — Z792 Long term (current) use of antibiotics: Secondary | ICD-10-CM | POA: Diagnosis not present

## 2013-12-01 DIAGNOSIS — F319 Bipolar disorder, unspecified: Secondary | ICD-10-CM | POA: Insufficient documentation

## 2013-12-01 DIAGNOSIS — Z79899 Other long term (current) drug therapy: Secondary | ICD-10-CM | POA: Diagnosis not present

## 2013-12-01 DIAGNOSIS — R001 Bradycardia, unspecified: Secondary | ICD-10-CM

## 2013-12-01 LAB — COMPREHENSIVE METABOLIC PANEL
ALBUMIN: 4 g/dL (ref 3.5–5.2)
ALT: 21 U/L (ref 0–53)
ANION GAP: 12 (ref 5–15)
AST: 24 U/L (ref 0–37)
Alkaline Phosphatase: 69 U/L (ref 39–117)
BILIRUBIN TOTAL: 0.8 mg/dL (ref 0.3–1.2)
BUN: 13 mg/dL (ref 6–23)
CALCIUM: 9.3 mg/dL (ref 8.4–10.5)
CO2: 25 mEq/L (ref 19–32)
CREATININE: 0.75 mg/dL (ref 0.50–1.35)
Chloride: 104 mEq/L (ref 96–112)
GFR calc Af Amer: >90 mL/min (ref >90)
GFR calc non Af Amer: >90 mL/min (ref >90)
Glucose, Bld: 98 mg/dL (ref 70–99)
Potassium: 4.1 mEq/L (ref 3.7–5.3)
Sodium: 141 mEq/L (ref 137–147)
TOTAL PROTEIN: 7.4 g/dL (ref 6.0–8.3)

## 2013-12-01 LAB — CBC WITH DIFFERENTIAL/PLATELET
BASOS ABS: 0 10*3/uL (ref 0.0–0.1)
BASOS PCT: 0 % (ref 0–1)
EOS ABS: 0.3 10*3/uL (ref 0.0–0.7)
EOS PCT: 4 % (ref 0–5)
HEMATOCRIT: 43.4 % (ref 39.0–52.0)
HEMOGLOBIN: 14.9 g/dL (ref 13.0–17.0)
Lymphocytes Relative: 33 % (ref 12–46)
Lymphs Abs: 2.3 10*3/uL (ref 0.7–4.0)
MCH: 32.4 pg (ref 26.0–34.0)
MCHC: 34.3 g/dL (ref 30.0–36.0)
MCV: 94.3 fL (ref 78.0–100.0)
MONO ABS: 0.7 10*3/uL (ref 0.1–1.0)
MONOS PCT: 10 % (ref 3–12)
NEUTROS ABS: 3.8 10*3/uL (ref 1.7–7.7)
Neutrophils Relative %: 53 % (ref 43–77)
Platelets: 155 10*3/uL (ref 150–400)
RBC: 4.6 MIL/uL (ref 4.22–5.81)
RDW: 12.4 % (ref 11.5–15.5)
WBC: 7.1 10*3/uL (ref 4.0–10.5)

## 2013-12-01 LAB — TROPONIN I

## 2013-12-01 NOTE — ED Notes (Signed)
Patient denies any dizziness or being lightheaded upon sitting and standing.

## 2013-12-01 NOTE — ED Notes (Signed)
Pt reports checked his heart rate at home prior to arrival with a result 44. Pt denies any other signs/symptoms. Pt heart rate in triage 67. Pt alert and oriented. Airway patent.

## 2013-12-01 NOTE — ED Provider Notes (Addendum)
CSN: [id number]     Arrival date & time 12/01/13  1458 History   First MD Initiated Contact with Patient 12/01/13 1513     Chief Complaint  Patient presents with  . Bradycardia     (Consider location/radiation/quality/duration/timing/severity/associated sxs/prior Treatment) Patient is a 68 y.o. male presenting with weakness. The history is provided by the patient (the pt states his heart rate has been in the forties, but he has no other symptoms).  Weakness This is a new problem. The current episode started 2 days ago. The problem occurs constantly. The problem has not changed since onset.Pertinent negatives include no chest pain, no abdominal pain and no headaches. Nothing aggravates the symptoms. Nothing relieves the symptoms.    Past Medical History  Diagnosis Date  . Hypertension   . Coronary artery disease   . Bipolar disorder   . Depression    Past Surgical History  Procedure Laterality Date  . 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 No pertinent family history. History  Substance Use Topics  . Smoking status: Never Smoker   . Smokeless tobacco: Not on file  . Alcohol Use: Yes     Comment: occasional    Review of Systems  Constitutional: Negative for appetite change and fatigue.  HENT: Negative for congestion, ear discharge and sinus pressure.   Eyes: Negative for discharge.  Respiratory: Negative for cough.   Cardiovascular: Negative for chest pain.  Gastrointestinal: Negative for abdominal pain and diarrhea.  Genitourinary: Negative for frequency and hematuria.  Musculoskeletal: Negative for back pain.  Skin: Negative for rash.  Neurological: Positive for weakness. Negative for seizures and headaches.  Psychiatric/Behavioral: Negative for hallucinations.      Allergies  Review of patient's allergies indicates no known allergies.  Home Medications   Prior to Admission medications   Medication Sig Start Date End Date Taking?  Authorizing Provider  doxazosin (CARDURA) 8 MG tablet Take 8 mg by mouth at bedtime.   Yes Historical Provider, MD  finasteride (PROSCAR) 5 MG tablet Take 5 mg by mouth daily.   Yes Historical Provider, MD  lisinopril (PRINIVIL,ZESTRIL) 5 MG tablet Take 5 mg by mouth daily.   Yes Historical Provider, MD  metoprolol tartrate (LOPRESSOR) 25 MG tablet Take 12.5 mg by mouth 2 (two) times daily.   Yes Historical Provider, MD  tamsulosin (FLOMAX) 0.4 MG CAPS Take 0.4 mg by mouth daily.   Yes Historical Provider, MD  venlafaxine XR (EFFEXOR-XR) 150 MG 24 hr capsule Take 150 mg by mouth daily.   Yes Historical Provider, MD  doxycycline (VIBRAMYCIN) 100 MG capsule Take 1 capsule (100 mg total) by mouth 2 (two) times daily. For 14 days Patient not taking: Reported on 12/01/2013 06/07/12   Tammy L. Triplett, PA-C  nitroGLYCERIN (NITROSTAT) 0.4 MG SL tablet Place 0.4 mg under the tongue every 5 (five) minutes as needed for chest pain.    Historical Provider, MD   BP 126/82 mmHg  Pulse 47  Temp(Src) 97.8 F (36.6 C) (Oral)  Resp 12  Ht 6' (1.829 m)  Wt 252 lb (114.306 kg)  BMI 34.17 kg/m2  SpO2 94% Physical Exam  Constitutional: He is oriented to person, place, and time. He appears well-developed.  HENT:  Head: Normocephalic.  Eyes: Conjunctivae and EOM are normal. No scleral icterus.  Neck: Neck supple. No thyromegaly present.  Cardiovascular: Normal rate and regular rhythm.  Exam reveals no gallop and no friction rub.   No  murmur heard. Pulmonary/Chest: No stridor. He has no wheezes. He has no rales. He exhibits no tenderness.  Abdominal: He exhibits no distension. There is no tenderness. There is no rebound.  Musculoskeletal: Normal range of motion. He exhibits no edema.  Lymphadenopathy:    He has no cervical adenopathy.  Neurological: He is oriented to person, place, and time. He exhibits normal muscle tone. Coordination normal.  Skin: No rash noted. No erythema.  Psychiatric: He has a  normal mood and affect. His behavior is normal.    ED Course  Procedures (including critical care time) Labs Review Labs Reviewed  CBC WITH DIFFERENTIAL  COMPREHENSIVE METABOLIC PANEL  TROPONIN I    Imaging Review Dg Chest 2 View  12/01/2013   CLINICAL DATA:  Bradycardia for 1 day  EXAM: CHEST  2 VIEW  COMPARISON:  Jun 07, 2012  FINDINGS: There is subtle hazy opacity in the right apex compared to the left. Elsewhere lungs are clear. Heart size and pulmonary vascularity are normal. No adenopathy. There is arthropathy in each shoulder.  IMPRESSION: Subtle hazy opacity right apex. Question early pneumonia in this region. Elsewhere lungs are clear. There is arthropathy in each shoulder.   Electronically Signed   By: Bretta BangWilliam  Woodruff M.D.   On: 12/01/2013 15:48     EKG Interpretation   Date/Time:  Wednesday December 01 2013 15:17:14 EST Ventricular Rate:  50 PR Interval:  194 QRS Duration: 98 QT Interval:  436 QTC Calculation: 398 R Axis:   23 Text Interpretation:  Sinus rhythm Low voltage, precordial leads Abnormal  R-wave progression, early transition Confirmed by Delinda Malan  MD, Moua Rasmusson  [phone]([id number]) on 12/01/2013 6:12:53 PM      MDM   Final diagnoses:  Bradycardia        Benny LennertJoseph L Caroljean Monsivais, MD 12/01/13 1813  Benny LennertJoseph L Johncarlo Maalouf, MD 12/01/13 [phone]

## 2013-12-01 NOTE — Discharge Instructions (Signed)
Follow up with your md as scheduled.  Return sooner if problems

## 2014-09-08 ENCOUNTER — Encounter (HOSPITAL_COMMUNITY): Payer: Self-pay

## 2014-09-08 ENCOUNTER — Emergency Department (HOSPITAL_COMMUNITY)
Admission: EM | Admit: 2014-09-08 | Discharge: 2014-09-08 | Disposition: A | Discharge status: Home / Self Care | Payer: Non-veteran care | Attending: Emergency Medicine | Admitting: Emergency Medicine

## 2014-09-08 DIAGNOSIS — F319 Bipolar disorder, unspecified: Secondary | ICD-10-CM | POA: Diagnosis not present

## 2014-09-08 DIAGNOSIS — Z7982 Long term (current) use of aspirin: Secondary | ICD-10-CM | POA: Diagnosis not present

## 2014-09-08 DIAGNOSIS — K0889 Other specified disorders of teeth and supporting structures: Secondary | ICD-10-CM

## 2014-09-08 DIAGNOSIS — Z79899 Other long term (current) drug therapy: Secondary | ICD-10-CM | POA: Insufficient documentation

## 2014-09-08 DIAGNOSIS — R000 Tachycardia, unspecified: Secondary | ICD-10-CM | POA: Diagnosis not present

## 2014-09-08 DIAGNOSIS — I10 Essential (primary) hypertension: Secondary | ICD-10-CM | POA: Insufficient documentation

## 2014-09-08 DIAGNOSIS — I2510 Atherosclerotic heart disease of native coronary artery without angina pectoris: Secondary | ICD-10-CM | POA: Insufficient documentation

## 2014-09-08 DIAGNOSIS — K088 Other specified disorders of teeth and supporting structures: Secondary | ICD-10-CM | POA: Diagnosis not present

## 2014-09-08 MED ORDER — IBUPROFEN 800 MG PO TABS
800.0000 mg | ORAL_TABLET | Freq: Once | ORAL | Status: AC
  Administered 2014-09-08: 800 mg via ORAL
  Filled 2014-09-08: qty 1

## 2014-09-08 MED ORDER — AMOXICILLIN 500 MG PO CAPS: 1000.0000 mg | ORAL_CAPSULE | Freq: Two times a day (BID) | ORAL | Status: DC

## 2014-09-08 MED ORDER — AMOXICILLIN 250 MG PO CAPS
1000.0000 mg | ORAL_CAPSULE | Freq: Once | ORAL | Status: AC
  Administered 2014-09-08: 1000 mg via ORAL
  Filled 2014-09-08: qty 4

## 2014-09-08 MED ORDER — NAPROXEN 500 MG PO TABS: 500.0000 mg | ORAL_TABLET | Freq: Two times a day (BID) | ORAL | Status: DC

## 2014-09-08 NOTE — ED Provider Notes (Signed)
CSN: [id number]     Arrival date & time 09/08/14  4098 History   First MD Initiated Contact with Patient 09/08/14 0327     Chief Complaint  Patient presents with  . Tachycardia     (Consider location/radiation/quality/duration/timing/severity/associated sxs/prior Treatment) The history is provided by the patient.   69 year old male states that he has been having pain in the right lower tooth which started about 4 days after the filling was placed in that tooth. That was about 3 weeks ago. He has continued to have pain which he describes as throbbing. He has been taking hydrocodone-acetaminophen for pain with partial relief. Tonight, he woke up with his teeth throbbing and he checked his blood pressure and pulse. Blood pressure was normal, but pulse was 105. He was worried about this because he is on metoprolol and his heart shouldn't be going that fast. He denies chest pain, heaviness, tightness, pressure. He denies nausea or vomiting or dyspnea.  Past Medical History  Diagnosis Date  . Hypertension   . Coronary artery disease   . Bipolar disorder   . Depression    Past Surgical History  Procedure Laterality Date  . 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 Social History  Substance Use Topics  . Smoking status: Never Smoker   . Smokeless tobacco: None  . Alcohol Use: Yes     Comment: occasional    Review of Systems  All other systems reviewed and are negative.     Allergies  Review of patient's allergies indicates no known allergies.  Home Medications   Prior to Admission medications   Medication Sig Start Date End Date Taking? Authorizing Provider  aspirin 81 MG tablet Take 81 mg by mouth daily.   Yes Historical Provider, MD  doxazosin (CARDURA) 8 MG tablet Take 8 mg by mouth at bedtime.   Yes Historical Provider, MD  finasteride (PROSCAR) 5 MG tablet Take 5 mg by mouth daily.   Yes Historical Provider, MD  lisinopril  (PRINIVIL,ZESTRIL) 5 MG tablet Take 5 mg by mouth daily.   Yes Historical Provider, MD  metoprolol tartrate (LOPRESSOR) 25 MG tablet Take 12.5 mg by mouth 2 (two) times daily.   Yes Historical Provider, MD  nitroGLYCERIN (NITROSTAT) 0.4 MG SL tablet Place 0.4 mg under the tongue every 5 (five) minutes as needed for chest pain.   Yes Historical Provider, MD  tamsulosin (FLOMAX) 0.4 MG CAPS Take 0.4 mg by mouth daily.   Yes Historical Provider, MD  venlafaxine XR (EFFEXOR-XR) 150 MG 24 hr capsule Take 150 mg by mouth daily.   Yes Historical Provider, MD  doxycycline (VIBRAMYCIN) 100 MG capsule Take 1 capsule (100 mg total) by mouth 2 (two) times daily. For 14 days Patient not taking: Reported on 12/01/2013 06/07/12   Tammy Triplett, PA-C   BP 161/93 mmHg  Pulse 106  Temp(Src) 99.7 F (37.6 C) (Oral)  Resp 18  Ht 6' (1.829 m)  Wt 256 lb (116.121 kg)  BMI 34.71 kg/m2  SpO2 96% Physical Exam  Nursing note and vitals reviewed.  69 year old male, resting comfortably and in no acute distress. Vital signs are significant for tachycardia and hypertension. Oxygen saturation is 96%, which is normal. Head is normocephalic and atraumatic. PERRLA, EOMI. Oropharynx is clear. Neck is nontender and supple without adenopathy or JVD. There is a filling in tooth #30 with some swelling and pallor of the underlying gingiva. This is  moderately tender to palpation. There is no tenderness to percussion of any teeth. Tooth #31 and 32 have been previously extracted Back is nontender and there is no CVA tenderness. Lungs are clear without rales, wheezes, or rhonchi. Chest is nontender. Heart has regular rate and rhythm without murmur. Abdomen is soft, flat, nontender without masses or hepatosplenomegaly and peristalsis is normoactive. Extremities have no cyanosis or edema, full range of motion is present. Skin is warm and dry without rash. Neurologic: Mental status is normal, cranial nerves are intact, there are no  motor or sensory deficits.  ED Course  Procedures (including critical care time)   EKG Interpretation   Date/Time:  Thursday September 08 2014 03:22:13 EDT Ventricular Rate:  106 PR Interval:  174 QRS Duration: 91 QT Interval:  314 QTC Calculation: 417 R Axis:   27 Text Interpretation:  Sinus tachycardia Abnormal R-wave progression, early  transition When compared with ECG of 12/01/2013, HEART RATE has increased  Confirmed by [facility]  MD, Desirre Eickhoff ([id number]) on 09/08/2014 3:26:51 AM      MDM   Final diagnoses:  Pain, dental  Sinus tachycardia    Dental pain which is probably dental abscess. He is referred back to his dentist for definitive treatment. He is started on amoxicillin. He is advised to continue taking his hydrocodone-acetaminophen for pain. He is also given a prescription for naproxen. His dose of metoprolol is quite low and I'm not supposed that he is able to have some breakthrough Tachycardia with this. Was explained to patient. I do not see indication for any further ED workup.    Dione Booze, MD 09/08/14 [id number]

## 2014-09-08 NOTE — ED Notes (Signed)
Pt states he had a tooth on the right lower side filled a couple of weeks ago, states he awoke this am with the tooth throbbing and painful.  Pt states he took his pulse and it was 105 which he feels is too high for him due to him being on metoprolol.  Pt c/o mild headache also

## 2014-09-08 NOTE — Discharge Instructions (Signed)
Continue taking your hydrocodone-acetaminophen as needed for pain. See your dentist as soon as possible to take care of your tooth.  Dental Pain A tooth ache may be caused by cavities (tooth decay). Cavities expose the nerve of the tooth to air and hot or cold temperatures. It may come from an infection or abscess (also called a boil or furuncle) around your tooth. It is also often caused by dental caries (tooth decay). This causes the pain you are having. DIAGNOSIS  Your caregiver can diagnose this problem by exam. TREATMENT   If caused by an infection, it may be treated with medications which kill germs (antibiotics) and pain medications as prescribed by your caregiver. Take medications as directed.  Only take over-the-counter or prescription medicines for pain, discomfort, or fever as directed by your caregiver.  Whether the tooth ache today is caused by infection or dental disease, you should see your dentist as soon as possible for further care. SEEK MEDICAL CARE IF: The exam and treatment you received today has been provided on an emergency basis only. This is not a substitute for complete medical or dental care. If your problem worsens or new problems (symptoms) appear, and you are unable to meet with your dentist, call or return to this location. SEEK IMMEDIATE MEDICAL CARE IF:   You have a fever.  You develop redness and swelling of your face, jaw, or neck.  You are unable to open your mouth.  You have severe pain uncontrolled by pain medicine. MAKE SURE YOU:   Understand these instructions.  Will watch your condition.  Will get help right away if you are not doing well or get worse. Document Released: 01/14/2005 Document Revised: 04/08/2011 Document Reviewed: 09/02/2007 [facility] Patient Information 2015 [place], [place]. This information is not intended to replace advice given to you by your health care provider. Make sure you discuss any questions you have with your health  care provider.  Amoxicillin capsules or tablets What is this medicine? AMOXICILLIN (a mox i SIL in) is a penicillin antibiotic. It is used to treat certain kinds of bacterial infections. It will not work for colds, flu, or other viral infections. This medicine may be used for other purposes; ask your health care provider or pharmacist if you have questions. COMMON BRAND NAME(S): Amoxil, Moxilin, Sumox, Trimox What should I tell my health care provider before I take this medicine? They need to know if you have any of these conditions: -asthma -kidney disease -an unusual or allergic reaction to amoxicillin, other penicillins, cephalosporin antibiotics, other medicines, foods, dyes, or preservatives -pregnant or trying to get pregnant -breast-feeding How should I use this medicine? Take this medicine by mouth with a glass of water. Follow the directions on your prescription label. You may take this medicine with food or on an empty stomach. Take your medicine at regular intervals. Do not take your medicine more often than directed. Take all of your medicine as directed even if you think your are better. Do not skip doses or stop your medicine early. Talk to your pediatrician regarding the use of this medicine in children. While this drug may be prescribed for selected conditions, precautions do apply. Overdosage: If you think you have taken too much of this medicine contact a poison control center or emergency room at once. NOTE: This medicine is only for you. Do not share this medicine with others. What if I miss a dose? If you miss a dose, take it as soon as you can. If  it is almost time for your next dose, take only that dose. Do not take double or extra doses. What may interact with this medicine? -amiloride -birth control pills -chloramphenicol -macrolides -probenecid -sulfonamides -tetracyclines This list may not describe all possible interactions. Give your health care provider a  list of all the medicines, herbs, non-prescription drugs, or dietary supplements you use. Also tell them if you smoke, drink alcohol, or use illegal drugs. Some items may interact with your medicine. What should I watch for while using this medicine? Tell your doctor or health care professional if your symptoms do not improve in 2 or 3 days. Take all of the doses of your medicine as directed. Do not skip doses or stop your medicine early. If you are diabetic, you may get a false positive result for sugar in your urine with certain brands of urine tests. Check with your doctor. Do not treat diarrhea with over-the-counter products. Contact your doctor if you have diarrhea that lasts more than 2 days or if the diarrhea is severe and watery. What side effects may I notice from receiving this medicine? Side effects that you should report to your doctor or health care professional as soon as possible: -allergic reactions like skin rash, itching or hives, swelling of the face, lips, or tongue -breathing problems -dark urine -redness, blistering, peeling or loosening of the skin, including inside the mouth -seizures -severe or watery diarrhea -trouble passing urine or change in the amount of urine -unusual bleeding or bruising -unusually weak or tired -yellowing of the eyes or skin Side effects that usually do not require medical attention (report to your doctor or health care professional if they continue or are bothersome): -dizziness -headache -stomach upset -trouble sleeping This list may not describe all possible side effects. Call your doctor for medical advice about side effects. You may report side effects to FDA at 1-800-FDA-1088. Where should I keep my medicine? Keep out of the reach of children. Store between 68 and 77 degrees F (20 and 25 degrees C). Keep bottle closed tightly. Throw away any unused medicine after the expiration date. NOTE: This sheet is a summary. It may not cover all  possible information. If you have questions about this medicine, talk to your doctor, pharmacist, or health care provider.  2015, [employer]. (2007-04-07 14:10:59)  Naproxen and naproxen sodium oral immediate-release tablets What is this medicine? NAPROXEN (na PROX en) is a non-steroidal anti-inflammatory drug (NSAID). It is used to reduce swelling and to treat pain. This medicine may be used for dental pain, headache, or painful monthly periods. It is also used for painful joint and muscular problems such as arthritis, tendinitis, bursitis, and gout. This medicine may be used for other purposes; ask your health care provider or pharmacist if you have questions. COMMON BRAND NAME(S): Aflaxen, Aleve, Aleve Arthritis, All Day Relief, Anaprox, Anaprox DS, Naprosyn What should I tell my health care provider before I take this medicine? They need to know if you have any of these conditions: -asthma -cigarette smoker -drink more than 3 alcohol containing drinks a day -heart disease or circulation problems such as heart failure or leg edema (fluid retention) -high blood pressure -kidney disease -liver disease -stomach bleeding or ulcers -an unusual or allergic reaction to naproxen, aspirin, other NSAIDs, other medicines, foods, dyes, or preservatives -pregnant or trying to get pregnant -breast-feeding How should I use this medicine? Take this medicine by mouth with a glass of water. Follow the directions on the prescription label.  Take it with food if your stomach gets upset. Try to not lie down for at least 10 minutes after you take it. Take your medicine at regular intervals. Do not take your medicine more often than directed. Long-term, continuous use may increase the risk of heart attack or stroke. A special MedGuide will be given to you by the pharmacist with each prescription and refill. Be sure to read this information carefully each time. Talk to your pediatrician regarding the  use of this medicine in children. Special care may be needed. Overdosage: If you think you have taken too much of this medicine contact a poison control center or emergency room at once. NOTE: This medicine is only for you. Do not share this medicine with others. What if I miss a dose? If you miss a dose, take it as soon as you can. If it is almost time for your next dose, take only that dose. Do not take double or extra doses. What may interact with this medicine? -alcohol -aspirin -cidofovir -diuretics -lithium -methotrexate -other drugs for inflammation like ketorolac or prednisone -pemetrexed -probenecid -warfarin This list may not describe all possible interactions. Give your health care provider a list of all the medicines, herbs, non-prescription drugs, or dietary supplements you use. Also tell them if you smoke, drink alcohol, or use illegal drugs. Some items may interact with your medicine. What should I watch for while using this medicine? Tell your doctor or health care professional if your pain does not get better. Talk to your doctor before taking another medicine for pain. Do not treat yourself. This medicine does not prevent heart attack or stroke. In fact, this medicine may increase the chance of a heart attack or stroke. The chance may increase with longer use of this medicine and in people who have heart disease. If you take aspirin to prevent heart attack or stroke, talk with your doctor or health care professional. Do not take other medicines that contain aspirin, ibuprofen, or naproxen with this medicine. Side effects such as stomach upset, nausea, or ulcers may be more likely to occur. Many medicines available without a prescription should not be taken with this medicine. This medicine can cause ulcers and bleeding in the stomach and intestines at any time during treatment. Do not smoke cigarettes or drink alcohol. These increase irritation to your stomach and can make it  more susceptible to damage from this medicine. Ulcers and bleeding can happen without warning symptoms and can cause death. You may get drowsy or dizzy. Do not drive, use machinery, or do anything that needs mental alertness until you know how this medicine affects you. Do not stand or sit up quickly, especially if you are an older patient. This reduces the risk of dizzy or fainting spells. This medicine can cause you to bleed more easily. Try to avoid damage to your teeth and gums when you brush or floss your teeth. What side effects may I notice from receiving this medicine? Side effects that you should report to your doctor or health care professional as soon as possible: -black or bloody stools, blood in the urine or vomit -blurred vision -chest pain -difficulty breathing or wheezing -nausea or vomiting -severe stomach pain -skin rash, skin redness, blistering or peeling skin, hives, or itching -slurred speech or weakness on one side of the body -swelling of eyelids, throat, lips -unexplained weight gain or swelling -unusually weak or tired -yellowing of eyes or skin Side effects that usually do not require medical  attention (report to your doctor or health care professional if they continue or are bothersome): -constipation -headache -heartburn This list may not describe all possible side effects. Call your doctor for medical advice about side effects. You may report side effects to FDA at 1-800-FDA-1088. Where should I keep my medicine? Keep out of the reach of children. Store at room temperature between 15 and 30 degrees C (59 and 86 degrees F). Keep container tightly closed. Throw away any unused medicine after the expiration date. NOTE: This sheet is a summary. It may not cover all possible information. If you have questions about this medicine, talk to your doctor, pharmacist, or health care provider.  2015, [employer]. (2009-01-16 20:10:16)

## 2017-02-20 ENCOUNTER — Emergency Department (HOSPITAL_COMMUNITY): Payer: Non-veteran care

## 2017-02-20 ENCOUNTER — Emergency Department (HOSPITAL_COMMUNITY)
Admission: EM | Admit: 2017-02-20 | Discharge: 2017-02-20 | Disposition: A | Discharge status: Home / Self Care | Payer: Non-veteran care | Attending: Emergency Medicine | Admitting: Emergency Medicine

## 2017-02-20 ENCOUNTER — Encounter (HOSPITAL_COMMUNITY): Payer: Self-pay

## 2017-02-20 DIAGNOSIS — S80211A Abrasion, right knee, initial encounter: Secondary | ICD-10-CM | POA: Diagnosis not present

## 2017-02-20 DIAGNOSIS — Y929 Unspecified place or not applicable: Secondary | ICD-10-CM | POA: Diagnosis not present

## 2017-02-20 DIAGNOSIS — Z79899 Other long term (current) drug therapy: Secondary | ICD-10-CM | POA: Diagnosis not present

## 2017-02-20 DIAGNOSIS — I259 Chronic ischemic heart disease, unspecified: Secondary | ICD-10-CM | POA: Diagnosis not present

## 2017-02-20 DIAGNOSIS — S40811A Abrasion of right upper arm, initial encounter: Secondary | ICD-10-CM | POA: Insufficient documentation

## 2017-02-20 DIAGNOSIS — Z23 Encounter for immunization: Secondary | ICD-10-CM | POA: Insufficient documentation

## 2017-02-20 DIAGNOSIS — S80811A Abrasion, right lower leg, initial encounter: Secondary | ICD-10-CM | POA: Diagnosis not present

## 2017-02-20 DIAGNOSIS — Z7982 Long term (current) use of aspirin: Secondary | ICD-10-CM | POA: Insufficient documentation

## 2017-02-20 DIAGNOSIS — I10 Essential (primary) hypertension: Secondary | ICD-10-CM | POA: Diagnosis not present

## 2017-02-20 DIAGNOSIS — W108XXA Fall (on) (from) other stairs and steps, initial encounter: Secondary | ICD-10-CM | POA: Diagnosis not present

## 2017-02-20 DIAGNOSIS — Y939 Activity, unspecified: Secondary | ICD-10-CM | POA: Insufficient documentation

## 2017-02-20 DIAGNOSIS — Y999 Unspecified external cause status: Secondary | ICD-10-CM | POA: Diagnosis not present

## 2017-02-20 DIAGNOSIS — S52134A Nondisplaced fracture of neck of right radius, initial encounter for closed fracture: Secondary | ICD-10-CM | POA: Diagnosis not present

## 2017-02-20 DIAGNOSIS — W19XXXA Unspecified fall, initial encounter: Secondary | ICD-10-CM

## 2017-02-20 DIAGNOSIS — S0990XA Unspecified injury of head, initial encounter: Secondary | ICD-10-CM | POA: Diagnosis present

## 2017-02-20 DIAGNOSIS — T07XXXA Unspecified multiple injuries, initial encounter: Secondary | ICD-10-CM

## 2017-02-20 MED ORDER — OXYCODONE-ACETAMINOPHEN 5-325 MG PO TABS
1.0000 | ORAL_TABLET | Freq: Once | ORAL | Status: AC
  Administered 2017-02-20: 1 via ORAL
  Filled 2017-02-20: qty 1

## 2017-02-20 MED ORDER — ONDANSETRON 8 MG PO TBDP
8.0000 mg | ORAL_TABLET | Freq: Once | ORAL | Status: AC
  Administered 2017-02-20: 8 mg via ORAL
  Filled 2017-02-20: qty 1

## 2017-02-20 MED ORDER — TETANUS-DIPHTH-ACELL PERTUSSIS 5-2.5-18.5 LF-MCG/0.5 IM SUSP
0.5000 mL | Freq: Once | INTRAMUSCULAR | Status: AC
  Administered 2017-02-20: 0.5 mL via INTRAMUSCULAR
  Filled 2017-02-20: qty 0.5

## 2017-02-20 MED ORDER — BACITRACIN ZINC 500 UNIT/GM EX OINT
1.0000 "application " | TOPICAL_OINTMENT | Freq: Two times a day (BID) | CUTANEOUS | Status: DC
  Administered 2017-02-20: 1 via TOPICAL
  Filled 2017-02-20 (×2): qty 0.9

## 2017-02-20 MED ORDER — TETANUS-DIPHTH-ACELL PERTUSSIS 5-2.5-18.5 LF-MCG/0.5 IM SUSP
INTRAMUSCULAR | Status: AC
  Filled 2017-02-20: qty 0.5

## 2017-02-20 NOTE — ED Provider Notes (Signed)
[facility] EMERGENCY DEPARTMENT Provider Note   CSN: [id number] Arrival date & time: 02/20/17  4098     History   Chief Complaint Chief Complaint  Patient presents with  . Fall    HPI Mario Massey is a 72 y.o. male.  HPI   Mario Massey is a 72 y.o. male with hx of HTN, CAD, who presents to the Emergency Department complaining of pain to his head, right arm,ribs and right leg.  States that he tripped and fell down 2 steps yesterday evening around 6:30 PM.  He states that his head "bounced" off the step and he landed on his right side.  He denies loss of consciousness, but states that he was unable to get up without assistance.  Pain is worsened since onset.  Complains of pain to his right arm and difficulty moving from the shoulder or elbow.  He also complains of multiple abrasions to his right arm and right lower leg.  He denies visual changes, vomiting, chest pain, shortness of breath and abdominal pain.  He also denies low back or hip pain.  He has not taken any medications or tried any therapies for symptomatic relief.  He denies taking any blood thinners.  Past Medical History:  Diagnosis Date  . Bipolar disorder (HCC)   . Coronary artery disease   . Depression   . Hypertension     Patient Active Problem List   Diagnosis Date Noted  . Stiffness of knee joint 01/17/2012  . Difficulty in walking(719.7) 01/17/2012    Past Surgical History:  Procedure Laterality Date  . KNEE SURGERY     with replacement on right knee       Home Medications    Prior to Admission medications   Medication Sig Start Date End Date Taking? Authorizing Provider  amoxicillin (AMOXIL) 500 MG capsule Take 2 capsules (1,000 mg total) by mouth 2 (two) times daily. 09/08/14   Dione Booze, MD  aspirin 81 MG tablet Take 81 mg by mouth daily.    [provider]  doxazosin (CARDURA) 8 MG tablet Take 8 mg by mouth at bedtime.    [provider]  finasteride (PROSCAR) 5 MG  tablet Take 5 mg by mouth daily.    [provider]  lisinopril (PRINIVIL,ZESTRIL) 5 MG tablet Take 5 mg by mouth daily.    [provider]  metoprolol tartrate (LOPRESSOR) 25 MG tablet Take 12.5 mg by mouth 2 (two) times daily.    [provider]  naproxen (NAPROSYN) 500 MG tablet Take 1 tablet (500 mg total) by mouth 2 (two) times daily. 09/08/14   Dione Booze, MD  nitroGLYCERIN (NITROSTAT) 0.4 MG SL tablet Place 0.4 mg under the tongue every 5 (five) minutes as needed for chest pain.    [provider]  tamsulosin (FLOMAX) 0.4 MG CAPS Take 0.4 mg by mouth daily.    [provider]  venlafaxine XR (EFFEXOR-XR) 150 MG 24 hr capsule Take 150 mg by mouth daily.    [provider]    Family History No family history on file.  Social History Social History   Tobacco Use  . Smoking status: Never Smoker  . Smokeless tobacco: Never Used  Substance Use Topics  . Alcohol use: Yes    Comment: occasional  . Drug use: No     Allergies   Patient has no known allergies.   Review of Systems Review of Systems  Constitutional: Negative for chills and fever.  HENT: Negative  for trouble swallowing.   Eyes: Negative for visual disturbance.  Respiratory: Negative for shortness of breath.   Cardiovascular: Positive for chest pain (Right rib pain).  Gastrointestinal: Negative for abdominal pain, nausea and vomiting.  Genitourinary: Negative for difficulty urinating, dysuria and flank pain.  Musculoskeletal: Positive for arthralgias (Right shoulder, elbow, and knee pain) and neck pain. Negative for joint swelling.  Skin: Negative for color change and wound (Multiple abrasions).  Neurological: Positive for headaches. Negative for dizziness, syncope, weakness and numbness.  Psychiatric/Behavioral: Negative for confusion.  All other systems reviewed and are negative.    Physical Exam Updated Vital Signs BP (!) 146/94   Pulse 90   Temp 99.4  F (37.4 C) (Oral)   Resp 18   Ht 6' (1.829 m)   Wt 118.8 kg (262 lb)   SpO2 96%   BMI 35.53 kg/m   Physical Exam  Constitutional: He is oriented to person, place, and time. He appears well-developed and well-nourished. No distress.  HENT:  Head: Atraumatic. Head is without raccoon's eyes and without Battle's sign.  Mouth/Throat: Oropharynx is clear and moist.  No abrasions or hematoma of the scalp   Eyes: Conjunctivae and EOM are normal. Pupils are equal, round, and reactive to light.  Neck: Normal range of motion.  Tenderness to palpation of the mid line lower cervical spine.  No bony step-offs or edema.  Cardiovascular: Normal rate, regular rhythm, normal heart sounds and intact distal pulses.  No murmur heard. Pulmonary/Chest: Effort normal and breath sounds normal. No respiratory distress. He exhibits tenderness (Mild tenderness to palpation of the lateral right ribs.  No abrasions or crepitus.).  Abdominal: Soft. He exhibits no distension and no mass. There is no tenderness. There is no rebound.  Musculoskeletal: Normal range of motion. He exhibits tenderness. He exhibits no edema or deformity.  Tenderness to palpation of the lateral and posterior right shoulder, lateral right elbow, and pain on range of motion of the right knee.  No edema or effusion.  Neurological: He is alert and oriented to person, place, and time. He has normal strength. No sensory deficit. Gait normal. GCS eye subscore is 4. GCS verbal subscore is 5. GCS motor subscore is 6.  CN III-XII intact.  No motor weakness  Skin: Skin is warm. Capillary refill takes less than 2 seconds.  Multiple abrasions to the posterior right shoulder, lateral right elbow, right knee, and right lower leg.  No edema.  No bleeding or drainage.  Psychiatric: He has a normal mood and affect.  Nursing note and vitals reviewed.    ED Treatments / Results  Labs (all labs ordered are listed, but only abnormal results are  displayed) Labs Reviewed - No data to display  EKG  EKG Interpretation None       Radiology Dg Ribs Unilateral W/chest Right  Result Date: 02/20/2017 CLINICAL DATA:  Fall EXAM: RIGHT RIBS AND CHEST - 3+ VIEW COMPARISON:  Chest two-view 12/01/2013 FINDINGS: No fracture or other bone lesions are seen involving the ribs. There is no evidence of pneumothorax or pleural effusion. Both lungs are clear. Heart size and mediastinal contours are within normal limits. Advanced degenerative change in both shoulders. IMPRESSION: Negative for fracture or other acute abnormality. Electronically Signed   By: Marlan Palau M.D.   On: 02/20/2017 10:40   Dg Shoulder Right  Result Date: 02/20/2017 CLINICAL DATA:  Fall.  Right arm pain. EXAM: RIGHT SHOULDER - 2+ VIEW COMPARISON:  No recent. FINDINGS: Severe acromioclavicular and  glenohumeral degenerative change. Diffuse osteopenia. No evidence of fracture or dislocation. No acute abnormality. IMPRESSION: Severe acromioclavicular glenohumeral degenerative change. Diffuse osteopenia. No acute abnormality. Electronically Signed   By: Maisie Fushomas  Register   On: 02/20/2017 10:40   Dg Elbow Complete Right  Result Date: 02/20/2017 CLINICAL DATA:  Fall last night.  Elbow pain.  Initial encounter. EXAM: RIGHT ELBOW - COMPLETE 3+ VIEW COMPARISON:  None. FINDINGS: Subtle the radial neck cortical lucency and trabecular distortion seen on 1 of the images labeled as oblique. No displacement. Due to patient condition a true lateral view could not be obtained. Presence of joint effusion is indeterminate. Normal joint alignment. IMPRESSION: 1. Subtle but probable nondisplaced radial neck fracture. 2. Due to could patient condition a true lateral view could not be obtained and presence of joint effusion is indeterminate. 3. Soft tissue swelling. Electronically Signed   By: Marnee SpringJonathon  Watts M.D.   On: 02/20/2017 10:40   Ct Head Wo Contrast  Result Date: 02/20/2017 CLINICAL DATA:   72 year old male with history of trauma from a fall with injury to the head. Neck pain and head pain. EXAM: CT HEAD WITHOUT CONTRAST CT CERVICAL SPINE WITHOUT CONTRAST TECHNIQUE: Multidetector CT imaging of the head and cervical spine was performed following the standard protocol without intravenous contrast. Multiplanar CT image reconstructions of the cervical spine were also generated. COMPARISON:  None. FINDINGS: CT HEAD FINDINGS Brain: Patchy and confluent areas of decreased attenuation are noted throughout the deep and periventricular white matter of the cerebral hemispheres bilaterally, compatible with chronic microvascular ischemic disease. No evidence of acute infarction, hemorrhage, hydrocephalus, extra-axial collection or mass lesion/mass effect. Vascular: No hyperdense vessel or unexpected calcification. Skull: Normal. Negative for fracture or focal lesion. Sinuses/Orbits: No acute finding. Other: None. CT CERVICAL SPINE FINDINGS Alignment: Reversal of normal cervical lordosis centered at the level of C5, likely positional. Otherwise normal alignment. Skull base and vertebrae: No acute fracture. No primary bone lesion or focal pathologic process. Soft tissues and spinal canal: No prevertebral fluid or swelling. No visible canal hematoma. Disc levels: Mild multilevel degenerative disc disease, most severe at C5-C6 and C6-C7. Upper chest: Unremarkable. Other: There are no aggressive appearing lytic or blastic lesions noted in the visualized portions of the skeleton. IMPRESSION: 1. No evidence of significant acute traumatic injury to the skull, brain or cervical spine. 2. Mild chronic microvascular ischemic changes in the cerebral white matter. 3. Mild multilevel degenerative disc disease and cervical spondylosis, as above. Electronically Signed   By: Trudie Reedaniel  Entrikin M.D.   On: 02/20/2017 11:10   Ct Cervical Spine Wo Contrast  Result Date: 02/20/2017 CLINICAL DATA:  72 year old male with history of  trauma from a fall with injury to the head. Neck pain and head pain. EXAM: CT HEAD WITHOUT CONTRAST CT CERVICAL SPINE WITHOUT CONTRAST TECHNIQUE: Multidetector CT imaging of the head and cervical spine was performed following the standard protocol without intravenous contrast. Multiplanar CT image reconstructions of the cervical spine were also generated. COMPARISON:  None. FINDINGS: CT HEAD FINDINGS Brain: Patchy and confluent areas of decreased attenuation are noted throughout the deep and periventricular white matter of the cerebral hemispheres bilaterally, compatible with chronic microvascular ischemic disease. No evidence of acute infarction, hemorrhage, hydrocephalus, extra-axial collection or mass lesion/mass effect. Vascular: No hyperdense vessel or unexpected calcification. Skull: Normal. Negative for fracture or focal lesion. Sinuses/Orbits: No acute finding. Other: None. CT CERVICAL SPINE FINDINGS Alignment: Reversal of normal cervical lordosis centered at the level of C5, likely positional. Otherwise  normal alignment. Skull base and vertebrae: No acute fracture. No primary bone lesion or focal pathologic process. Soft tissues and spinal canal: No prevertebral fluid or swelling. No visible canal hematoma. Disc levels: Mild multilevel degenerative disc disease, most severe at C5-C6 and C6-C7. Upper chest: Unremarkable. Other: There are no aggressive appearing lytic or blastic lesions noted in the visualized portions of the skeleton. IMPRESSION: 1. No evidence of significant acute traumatic injury to the skull, brain or cervical spine. 2. Mild chronic microvascular ischemic changes in the cerebral white matter. 3. Mild multilevel degenerative disc disease and cervical spondylosis, as above. Electronically Signed   By: Trudie Reed M.D.   On: 02/20/2017 11:10   Dg Knee Complete 4 Views Right  Result Date: 02/20/2017 CLINICAL DATA:  Larey Seat last night with pain. EXAM: RIGHT KNEE - COMPLETE 4+ VIEW  COMPARISON:  None. FINDINGS: Previous total knee arthroplasty. Components appear well positioned. No traumatic finding. No joint effusion. IMPRESSION: Good appearance following total knee replacement. No traumatic or unexpected findings. Electronically Signed   By: Paulina Fusi M.D.   On: 02/20/2017 10:39    Procedures Procedures (including critical care time)  Medications Ordered in ED Medications  oxyCODONE-acetaminophen (PERCOCET/ROXICET) 5-325 MG per tablet 1 tablet (1 tablet Oral Given 02/20/17 0932)  ondansetron (ZOFRAN-ODT) disintegrating tablet 8 mg (8 mg Oral Given 02/20/17 0932)  Tdap (BOOSTRIX) injection 0.5 mL (0.5 mLs Intramuscular Given 02/20/17 1216)     Initial Impression / Assessment and Plan / ED Course  I have reviewed the triage vital signs and the nursing notes.  Pertinent labs & imaging results that were available during my care of the patient were reviewed by me and considered in my medical decision making (see chart for details).     Patient well-appearing.  He ambulated into the department with a steady gait.  No focal neuro deficits.    X-ray right elbow shows likely nondisplaced fracture.  Closed injury.  Compartments soft. NV intact.   Sling applied by nursing.  Abrasions clean, bandaged and tetanus updated.  Patient prefers orthopedic follow-up within in the [place] system.  He appears safe to discharge home, agrees to treatment plan, return precautions discussed    Final Clinical Impressions(s) / ED Diagnoses   Final diagnoses:  Closed nondisplaced fracture of neck of right radius, initial encounter  Fall, initial encounter  Multiple abrasions    ED Discharge Orders    None       Rosey Bath 02/21/17 2037    Loren Racer, MD 03/01/17 1645

## 2017-02-20 NOTE — Discharge Instructions (Signed)
Apply ice packs on/off to your elbow.  Elevate and apply ice packs to your lower leg.  Clean the abrasions with mild soap and water and keep them bandaged for 1-2 days.  [facility] week.  Return to the ER for any worsening symptoms.

## 2017-02-20 NOTE — ED Triage Notes (Signed)
Pt reports he misjudged last night and fell down 2 steps.  Reports he hit his head and almost passed out.  C/O pain to neck, back, head, r arm and r knee.

## 2017-02-20 NOTE — Care Management (Signed)
PCP - Dr. Earna Coderuttle of [facility] for last 22 years.

## 2017-02-20 NOTE — ED Notes (Signed)
[facility] [facility]

## 2017-02-20 NOTE — ED Notes (Signed)
Patient transported to X-ray 

## 2019-04-06 ENCOUNTER — Emergency Department (HOSPITAL_COMMUNITY)
Admission: EM | Admit: 2019-04-06 | Discharge: 2019-04-06 | Disposition: A | Discharge status: Home / Self Care | Payer: No Typology Code available for payment source | Attending: Emergency Medicine | Admitting: Emergency Medicine

## 2019-04-06 ENCOUNTER — Other Ambulatory Visit: Payer: Self-pay

## 2019-04-06 ENCOUNTER — Encounter (HOSPITAL_COMMUNITY): Payer: Self-pay | Admitting: *Deleted

## 2019-04-06 DIAGNOSIS — T839XXA Unspecified complication of genitourinary prosthetic device, implant and graft, initial encounter: Secondary | ICD-10-CM | POA: Insufficient documentation

## 2019-04-06 DIAGNOSIS — I10 Essential (primary) hypertension: Secondary | ICD-10-CM | POA: Insufficient documentation

## 2019-04-06 DIAGNOSIS — Y69 Unspecified misadventure during surgical and medical care: Secondary | ICD-10-CM | POA: Diagnosis not present

## 2019-04-06 DIAGNOSIS — Z7982 Long term (current) use of aspirin: Secondary | ICD-10-CM | POA: Insufficient documentation

## 2019-04-06 DIAGNOSIS — I2510 Atherosclerotic heart disease of native coronary artery without angina pectoris: Secondary | ICD-10-CM | POA: Insufficient documentation

## 2019-04-06 DIAGNOSIS — Z79899 Other long term (current) drug therapy: Secondary | ICD-10-CM | POA: Insufficient documentation

## 2019-04-06 DIAGNOSIS — R339 Retention of urine, unspecified: Secondary | ICD-10-CM | POA: Diagnosis present

## 2019-04-06 LAB — URINALYSIS, ROUTINE W REFLEX MICROSCOPIC
Bilirubin Urine: NEGATIVE
Glucose, UA: NEGATIVE mg/dL
Ketones, ur: NEGATIVE mg/dL
Leukocytes,Ua: NEGATIVE
Nitrite: NEGATIVE
Protein, ur: 30 mg/dL — AB
RBC / HPF: >50 RBC/hpf — ABNORMAL HIGH (ref 0–5)
Specific Gravity, Urine: 1.01 (ref 1.005–1.030)
pH: 6 (ref 5.0–8.0)

## 2019-04-06 NOTE — ED Triage Notes (Signed)
States he has a catheter placed on 04/01/19. States it isn't working and he is unable to pass urine

## 2019-04-06 NOTE — ED Notes (Signed)
Noted that pt's leg bag extension was kinked. Leg bag replaced and catheter flushed with sterile water. of urine returned and catheter securing device applied to left leg. Urine sample obtained and sent to lab. PT voiced relief of pain at this time.

## 2019-04-06 NOTE — ED Provider Notes (Signed)
[facility] EMERGENCY DEPARTMENT Provider Note   CSN: [id number] Arrival date & time: 04/06/19  1255     History Chief Complaint  Patient presents with  . Dysuria  . Urinary Retention    Mario Massey is a 74 y.o. male.  Patient is a 74 year old gentleman with past medical history of urinary retention, coronary artery disease, bipolar disorder, hypertension presenting to the emergency department for urinary retention and suprapubic pain.  Patient had a recent urinary catheter placed at the [facility] and has follow-up with urology in 9 days.  Reports that today suddenly he was unable to pass any urine through the catheter and is having severe suprapubic pain.  Denies other symptoms.        Past Medical History:  Diagnosis Date  . Bipolar disorder (HCC)   . Coronary artery disease   . Depression   . Hypertension     Patient Active Problem List   Diagnosis Date Noted  . Stiffness of knee joint 01/17/2012  . Difficulty in walking(719.7) 01/17/2012    Past Surgical History:  Procedure Laterality Date  . KNEE SURGERY     with replacement on right knee       History reviewed. No pertinent family history.  Social History   Tobacco Use  . Smoking status: Never Smoker  . Smokeless tobacco: Never Used  Substance Use Topics  . Alcohol use: Yes    Comment: occasional  . Drug use: No    Home Medications Prior to Admission medications   Medication Sig Start Date End Date Taking? Authorizing Provider  aspirin 81 MG tablet Take 81 mg by mouth daily.   Yes [provider]  doxazosin (CARDURA) 8 MG tablet Take 8 mg by mouth at bedtime.   Yes [provider]  DULoxetine (CYMBALTA) 20 MG capsule Take 20 mg by mouth daily.   Yes [provider]  finasteride (PROSCAR) 5 MG tablet Take 5 mg by mouth daily.   Yes [provider]  lisinopril (PRINIVIL,ZESTRIL) 5 MG tablet Take 5 mg by mouth daily.   Yes [provider]  nitroGLYCERIN  (NITROSTAT) 0.4 MG SL tablet Place 0.4 mg under the tongue every 5 (five) minutes as needed for chest pain.   Yes [provider]  tamsulosin (FLOMAX) 0.4 MG CAPS Take 0.4 mg by mouth daily.   Yes [provider]  venlafaxine XR (EFFEXOR-XR) 150 MG 24 hr capsule Take 150 mg by mouth daily.   Yes [provider]    Allergies    Patient has no known allergies.  Review of Systems   Review of Systems  Gastrointestinal: Positive for abdominal pain. Negative for nausea and vomiting.  Genitourinary: Positive for difficulty urinating and dysuria. Negative for decreased urine volume, discharge, flank pain, frequency, hematuria, penile pain, penile swelling and urgency.    Physical Exam Updated Vital Signs BP (!) 145/82   Pulse 64   Temp 98.9 F (37.2 C) (Oral)   Resp 20   Ht 6' (1.829 m)   Wt 123.8 kg   SpO2 98%   BMI 37.03 kg/m   Physical Exam Vitals and nursing note reviewed. Exam conducted with a chaperone present.  Constitutional:      Appearance: Normal appearance.  HENT:     Head: Normocephalic.  Eyes:     Conjunctiva/sclera: Conjunctivae normal.  Pulmonary:     Effort: Pulmonary effort is normal.  Abdominal:     General: There is distension.  Genitourinary:    Comments:  Suprapubic tenderness.  Catheter in place without leakage around the penis. Skin:    General: Skin is dry.  Neurological:     Mental Status: He is alert.  Psychiatric:        Mood and Affect: Mood normal.     ED Results / Procedures / Treatments   Labs (all labs ordered are listed, but only abnormal results are displayed) Labs Reviewed  URINALYSIS, ROUTINE W REFLEX MICROSCOPIC - Abnormal; Notable for the following components:      Result Value   Hgb urine dipstick LARGE (*)    Protein, ur 30 (*)    RBC / HPF >50 (*)    Bacteria, UA RARE (*)    All other components within normal limits  URINE CULTURE    EKG None  Radiology No results found.  Procedures  Procedures (including critical care time)  Medications Ordered in ED Medications - No data to display  ED Course  I have reviewed the triage vital signs and the nursing notes.  Pertinent labs & imaging results that were available during my care of the patient were reviewed by me and considered in my medical decision making (see chart for details).  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  Catheter found to be cinched accidentally.  Bladder scan revealing 700 cc urine.  The Foley bag was replaced and the Foley catheter was irrigated with good results.  Patient had immediate relief of all symptoms with avoidance of over 700 cc of fluids.  Patient urine will be sent to the lab for urinalysis and culture.  Patient can follow-up with urology as scheduled.   [KM]    Clinical Course User Index [KM] Kristine Royal   MDM Rules/Calculators/A&P                      Based on review of vitals, medical screening exam, lab work and/or imaging, there does not appear to be an acute, emergent etiology for the patient's symptoms. Counseled pt on good return precautions and encouraged both PCP and ED follow-up as needed.  Prior to discharge, I also discussed incidental imaging findings with patient in detail and advised appropriate, recommended follow-up in detail.  Clinical Impression: 1. Problem with urinary catheter ([place])     Disposition: Discharge  Prior to providing a prescription for a controlled substance, I independently reviewed the patient's recent prescription history on the [facility]. The patient had no recent or regular prescriptions and was deemed appropriate for a brief, less than 3 day prescription of narcotic for acute analgesia.  This note was prepared with assistance of Therapist, sports. Occasional wrong-word or sound-a-like substitutions may have occurred due to the inherent limitations of voice recognition software.  Final Clinical Impression(s) / ED Diagnoses Final diagnoses:  Problem with urinary catheter [facility])    Rx / DC Orders ED Discharge Orders    None       Kristine Royal 04/06/19 1515    Milton Ferguson, MD 04/09/19 1512

## 2019-04-09 LAB — URINE CULTURE: Culture: 80000 — AB

## 2019-04-11 ENCOUNTER — Telehealth: Payer: Self-pay | Admitting: Emergency Medicine

## 2019-04-11 NOTE — Telephone Encounter (Signed)
Post ED Visit - Positive Culture Follow-up  Culture report reviewed by antimicrobial stewardship pharmacist: [facility] []  , Pharm.D. []  Enzo Bi, Pharm.D., BCPS AQ-ID []  , Pharm.D., BCPS []  Celedonio Miyamoto, Pharm.D., BCPS []  [place], Garvin Fila.D., BCPS, AAHIVP []  , Pharm.D., BCPS, AAHIVP []  Georgina Pillion, PharmD, BCPS []  , PharmD, BCPS []  [place], PharmD, BCPS [x]  [address], PharmD []  , PharmD, BCPS []  Estella Husk, PharmD  Pharmacy Team []  Lysle Pearl, PharmD []  , PharmD []  Phillips Climes, PharmD []  , Rph []  Agapito Games) , PharmD []  Joaquim Lai, PharmD []  , PharmD []  Mervyn Gay, PharmD []  , PharmD []  Vinnie Level, PharmD []  [facility], PharmD []  , PharmD []  Len Childs, PharmD   Positive urine culture No further patient follow-up is required at this time.  [place] 04/11/2019, 3:04 PM

## 2019-04-16 ENCOUNTER — Other Ambulatory Visit: Payer: Self-pay

## 2019-04-16 ENCOUNTER — Emergency Department (HOSPITAL_COMMUNITY)
Admission: EM | Admit: 2019-04-16 | Discharge: 2019-04-16 | Disposition: A | Discharge status: Home / Self Care | Payer: No Typology Code available for payment source | Attending: Emergency Medicine | Admitting: Emergency Medicine

## 2019-04-16 ENCOUNTER — Encounter (HOSPITAL_COMMUNITY): Payer: Self-pay | Admitting: Emergency Medicine

## 2019-04-16 DIAGNOSIS — Z7982 Long term (current) use of aspirin: Secondary | ICD-10-CM | POA: Insufficient documentation

## 2019-04-16 DIAGNOSIS — I10 Essential (primary) hypertension: Secondary | ICD-10-CM | POA: Diagnosis not present

## 2019-04-16 DIAGNOSIS — Z79899 Other long term (current) drug therapy: Secondary | ICD-10-CM | POA: Insufficient documentation

## 2019-04-16 DIAGNOSIS — T83098A Other mechanical complication of other indwelling urethral catheter, initial encounter: Secondary | ICD-10-CM | POA: Diagnosis not present

## 2019-04-16 DIAGNOSIS — I2510 Atherosclerotic heart disease of native coronary artery without angina pectoris: Secondary | ICD-10-CM | POA: Diagnosis not present

## 2019-04-16 DIAGNOSIS — R300 Dysuria: Secondary | ICD-10-CM | POA: Diagnosis present

## 2019-04-16 DIAGNOSIS — T839XXA Unspecified complication of genitourinary prosthetic device, implant and graft, initial encounter: Secondary | ICD-10-CM

## 2019-04-16 DIAGNOSIS — Y828 Other medical devices associated with adverse incidents: Secondary | ICD-10-CM | POA: Insufficient documentation

## 2019-04-16 NOTE — Discharge Instructions (Addendum)
If you have the problem again feel along the catheter and if you feel a bend in the catheter try to straighten the bend out and see if that does not resolve the problem.

## 2019-04-16 NOTE — ED Triage Notes (Signed)
Pt C/O burning when he urinates. Pt has foley catheter in place. Pt had foley catheter replaced yesterday at the [place].

## 2019-04-16 NOTE — ED Provider Notes (Signed)
[facility] EMERGENCY DEPARTMENT Provider Note   CSN: [id number] Arrival date & time: 04/16/19  6010   Time seen 3:47 AM  History Chief Complaint  Patient presents with  . Dysuria    Mason Dibiasio is a 74 y.o. male.  HPI   Patient states he got a Foley catheter placed about 15 days ago when he went to the [facility] on March 18 and they replaced the catheter in the bag and did some test on his bladder.  He states about 2 to 3 hours ago he started feeling that he had the urge to urinate and it was very painful when he urinated.  He states he is making about the same amount of urine as before.  He denies fever, nausea, vomiting, or abdominal pain.  I would like to add patient seems to be irritated when I ask him questions about what is going on tonight.  [facility]   Past Medical History:  Diagnosis Date  . Bipolar disorder ([place])   . Coronary artery disease   . Depression   . Hypertension     Patient Active Problem List   Diagnosis Date Noted  . Stiffness of knee joint 01/17/2012  . Difficulty in walking(719.7) 01/17/2012    Past Surgical History:  Procedure Laterality Date  . KNEE SURGERY     with replacement on right knee       No family history on file.  Social History   Tobacco Use  . Smoking status: Never Smoker  . Smokeless tobacco: Never Used  Substance Use Topics  . Alcohol use: Yes    Comment: occasional  . Drug use: No    Home Medications Prior to Admission medications   Medication Sig Start Date End Date Taking? Authorizing Provider  aspirin 81 MG tablet Take 81 mg by mouth daily.    [provider]  doxazosin (CARDURA) 8 MG tablet Take 8 mg by mouth at bedtime.    [provider]  DULoxetine (CYMBALTA) 20 MG capsule Take 20 mg by mouth daily.    [provider]  finasteride (PROSCAR) 5 MG tablet Take 5 mg by mouth daily.    [provider]  lisinopril (PRINIVIL,ZESTRIL) 5 MG tablet  Take 5 mg by mouth daily.    [provider]  nitroGLYCERIN (NITROSTAT) 0.4 MG SL tablet Place 0.4 mg under the tongue every 5 (five) minutes as needed for chest pain.    [provider]  tamsulosin (FLOMAX) 0.4 MG CAPS Take 0.4 mg by mouth daily.    [provider]  venlafaxine XR (EFFEXOR-XR) 150 MG 24 hr capsule Take 150 mg by mouth daily.    [provider]    Allergies    Patient has no known allergies.  Review of Systems   Review of Systems  All other systems reviewed and are negative.   Physical Exam Updated Vital Signs BP (!) 182/69   Pulse 68   Temp 98.3 F (36.8 C) (Oral)   Resp 17   Ht 6' (1.829 m)   Wt 123.4 kg   SpO2 99%   BMI 36.89 kg/m   Physical Exam Vitals and nursing note reviewed.  Constitutional:      General: He is not in acute distress.    Appearance: Normal appearance. He is obese.  HENT:     Head: Normocephalic and atraumatic.  Eyes:     Extraocular Movements: Extraocular movements intact.     Conjunctiva/sclera: Conjunctivae  normal.     Pupils: Pupils are equal, round, and reactive to light.  Cardiovascular:     Rate and Rhythm: Normal rate.  Pulmonary:     Effort: Pulmonary effort is normal. No respiratory distress.  Abdominal:     General: There is distension.     Palpations: Abdomen is soft.     Tenderness: There is no abdominal tenderness.     Comments: When I inspect patient's Foley catheter in the bag there is a kink where the catheter joins the end with the balloons where the tube gets thicker.  It appears to me they put the anchor on his leg in a position that makes the tube kink.  When I smoothed out the kink there is immediate release of urine into his back.  His leg bag was almost full when I left the room.  I am going to have nursing staff change the position of the anchor and hopefully that will improve his symptoms.  When I asked him if he felt better he told me that "I do not feel the need to  urinate yet so I can't tell".  Musculoskeletal:     Cervical back: Normal range of motion.  Skin:    General: Skin is warm and dry.     Findings: No erythema or rash.  Neurological:     General: No focal deficit present.     Mental Status: He is alert and oriented to person, place, and time.     Cranial Nerves: No cranial nerve deficit.  Psychiatric:        Mood and Affect: Mood normal.        Behavior: Behavior normal.        Thought Content: Thought content normal.     ED Results / Procedures / Treatments   Labs (all labs ordered are listed, but only abnormal results are displayed) Labs Reviewed - No data to display  EKG None  Radiology No results found.  Procedures Procedures (including critical care time)  Medications Ordered in ED Medications - No data to display  ED Course  I have reviewed the triage vital signs and the nursing notes.  Pertinent labs & imaging results that were available during my care of the patient were reviewed by me and considered in my medical decision making (see chart for details).    MDM Rules/Calculators/A&P                     During my exam I had unkinked patient's Foley catheter.  It was noted to be flowing very fast after that and filled his leg bag.  Nursing staff did a bladder scan right after that and said it was 137 cc.  Repeat bladder scan shows 0.  Patient states he has not had the feeling that he needs to urinate for at least 20 minutes.  At this point I think patient can be discharged.  I discussed with him if it happens again that he should feel along the tubing and if he feels a bend in it to straighten out the been so the urine can drain easier.  Final Clinical Impression(s) / ED Diagnoses Final diagnoses:  Problem with Foley catheter, initial encounter [facility])    Rx / DC Orders ED Discharge Orders    None     Plan discharge  Devoria Albe, MD, Concha Pyo, MD 04/16/19 [phone]

## 2019-04-19 ENCOUNTER — Encounter (HOSPITAL_COMMUNITY): Payer: Self-pay | Admitting: Emergency Medicine

## 2019-04-19 ENCOUNTER — Other Ambulatory Visit: Payer: Self-pay

## 2019-04-19 ENCOUNTER — Emergency Department (HOSPITAL_COMMUNITY)
Admission: EM | Admit: 2019-04-19 | Discharge: 2019-04-19 | Disposition: A | Discharge status: Home / Self Care | Payer: No Typology Code available for payment source | Attending: Emergency Medicine | Admitting: Emergency Medicine

## 2019-04-19 DIAGNOSIS — Z79899 Other long term (current) drug therapy: Secondary | ICD-10-CM | POA: Diagnosis not present

## 2019-04-19 DIAGNOSIS — T839XXD Unspecified complication of genitourinary prosthetic device, implant and graft, subsequent encounter: Secondary | ICD-10-CM

## 2019-04-19 DIAGNOSIS — Z466 Encounter for fitting and adjustment of urinary device: Secondary | ICD-10-CM | POA: Diagnosis not present

## 2019-04-19 DIAGNOSIS — Z96651 Presence of right artificial knee joint: Secondary | ICD-10-CM | POA: Diagnosis not present

## 2019-04-19 DIAGNOSIS — I10 Essential (primary) hypertension: Secondary | ICD-10-CM | POA: Diagnosis not present

## 2019-04-19 DIAGNOSIS — Z7982 Long term (current) use of aspirin: Secondary | ICD-10-CM | POA: Insufficient documentation

## 2019-04-19 DIAGNOSIS — T83038A Leakage of other indwelling urethral catheter, initial encounter: Secondary | ICD-10-CM | POA: Diagnosis not present

## 2019-04-19 NOTE — ED Triage Notes (Signed)
Pt states his urinary catheter started "leaking everywhere".

## 2019-04-19 NOTE — ED Provider Notes (Signed)
[facility] EMERGENCY DEPARTMENT Provider Note   CSN: [id number] Arrival date & time: 04/19/19  2106     History Chief Complaint  Patient presents with  . catheter leaking    Mario Massey is a 74 y.o. male.  Patient presents to the emergency department with catheter problem.  Patient reports that he has had this catheter for 19 days.  He is still awaiting his follow-up appointment with the [place].  He noticed some leaking of the tubing earlier tonight.        Past Medical History:  Diagnosis Date  . Bipolar disorder ([place])   . Coronary artery disease   . Depression   . Hypertension     Patient Active Problem List   Diagnosis Date Noted  . Stiffness of knee joint 01/17/2012  . Difficulty in walking(719.7) 01/17/2012    Past Surgical History:  Procedure Laterality Date  . KNEE SURGERY     with replacement on right knee       History reviewed. No pertinent family history.  Social History   Tobacco Use  . Smoking status: Never Smoker  . Smokeless tobacco: Never Used  Substance Use Topics  . Alcohol use: Yes    Comment: occasional  . Drug use: No    Home Medications Prior to Admission medications   Medication Sig Start Date End Date Taking? Authorizing Provider  aspirin 81 MG tablet Take 81 mg by mouth daily.    [provider]  doxazosin (CARDURA) 8 MG tablet Take 8 mg by mouth at bedtime.    [provider]  DULoxetine (CYMBALTA) 20 MG capsule Take 20 mg by mouth daily.    [provider]  finasteride (PROSCAR) 5 MG tablet Take 5 mg by mouth daily.    [provider]  lisinopril (PRINIVIL,ZESTRIL) 5 MG tablet Take 5 mg by mouth daily.    [provider]  nitroGLYCERIN (NITROSTAT) 0.4 MG SL tablet Place 0.4 mg under the tongue every 5 (five) minutes as needed for chest pain.    [provider]  tamsulosin (FLOMAX) 0.4 MG CAPS Take 0.4 mg by mouth daily.    [provider]  venlafaxine  XR (EFFEXOR-XR) 150 MG 24 hr capsule Take 150 mg by mouth daily.    [provider]    Allergies    Patient has no known allergies.  Review of Systems   Review of Systems  Genitourinary: Negative for decreased urine volume.  All other systems reviewed and are negative.   Physical Exam Updated Vital Signs BP (!) 156/85 (BP Location: Right Arm)   Pulse 60   Temp 98.2 F (36.8 C) (Oral)   Resp 18   Ht 6' (1.829 m)   Wt 123.4 kg   SpO2 99%   BMI 36.89 kg/m   Physical Exam Vitals and nursing note reviewed.  Constitutional:      General: He is not in acute distress.    Appearance: Normal appearance. He is well-developed.  HENT:     Head: Normocephalic and atraumatic.     Right Ear: Hearing normal.     Left Ear: Hearing normal.     Nose: Nose normal.  Eyes:     Conjunctiva/sclera: Conjunctivae normal.     Pupils: Pupils are equal, round, and reactive to light.  Cardiovascular:     Rate and Rhythm: Regular rhythm.     Heart sounds: S1 normal and S2 normal. No murmur. No friction rub. No gallop.   Pulmonary:  Effort: Pulmonary effort is normal. No respiratory distress.     Breath sounds: Normal breath sounds.  Chest:     Chest wall: No tenderness.  Abdominal:     General: Bowel sounds are normal.     Palpations: Abdomen is soft.     Tenderness: There is no abdominal tenderness. There is no guarding or rebound. Negative signs include Murphy's sign and McBurney's sign.     Hernia: No hernia is present.  Musculoskeletal:        General: Normal range of motion.     Cervical back: Normal range of motion and neck supple.  Skin:    General: Skin is warm and dry.     Findings: No rash.  Neurological:     Mental Status: He is alert and oriented to person, place, and time.     GCS: GCS eye subscore is 4. GCS verbal subscore is 5. GCS motor subscore is 6.     Cranial Nerves: No cranial nerve deficit.     Sensory: No sensory deficit.     Coordination: Coordination  normal.  Psychiatric:        Speech: Speech normal.        Behavior: Behavior normal.        Thought Content: Thought content normal.     ED Results / Procedures / Treatments   Labs (all labs ordered are listed, but only abnormal results are displayed) Labs Reviewed - No data to display  EKG None  Radiology No results found.  Procedures Procedures (including critical care time)  Medications Ordered in ED Medications - No data to display  ED Course  I have reviewed the triage vital signs and the nursing notes.  Pertinent labs & imaging results that were available during my care of the patient were reviewed by me and considered in my medical decision making (see chart for details).    MDM Rules/Calculators/A&P                      Patient returns with continued complaints of his Foley catheter.  Tubing was adjusted and leaking stopped, patient is not experiencing any discomfort.  He has been draining without any problem.  Exam is normal.  Final Clinical Impression(s) / ED Diagnoses Final diagnoses:  Problem with Foley catheter, subsequent encounter    Rx / DC Orders ED Discharge Orders    None       Gilda Crease, MD 04/19/19 2333

## 2019-04-28 ENCOUNTER — Other Ambulatory Visit: Payer: Self-pay

## 2019-04-28 ENCOUNTER — Encounter (HOSPITAL_COMMUNITY): Payer: Self-pay | Admitting: Emergency Medicine

## 2019-04-28 ENCOUNTER — Emergency Department (HOSPITAL_COMMUNITY)
Admission: EM | Admit: 2019-04-28 | Discharge: 2019-04-28 | Disposition: A | Discharge status: Home / Self Care | Payer: No Typology Code available for payment source | Attending: Emergency Medicine | Admitting: Emergency Medicine

## 2019-04-28 DIAGNOSIS — Z7982 Long term (current) use of aspirin: Secondary | ICD-10-CM | POA: Diagnosis not present

## 2019-04-28 DIAGNOSIS — R339 Retention of urine, unspecified: Secondary | ICD-10-CM | POA: Diagnosis not present

## 2019-04-28 DIAGNOSIS — Z79899 Other long term (current) drug therapy: Secondary | ICD-10-CM | POA: Insufficient documentation

## 2019-04-28 DIAGNOSIS — I2510 Atherosclerotic heart disease of native coronary artery without angina pectoris: Secondary | ICD-10-CM | POA: Diagnosis not present

## 2019-04-28 DIAGNOSIS — I10 Essential (primary) hypertension: Secondary | ICD-10-CM | POA: Diagnosis not present

## 2019-04-28 LAB — URINALYSIS, ROUTINE W REFLEX MICROSCOPIC
Bacteria, UA: NONE SEEN
Bilirubin Urine: NEGATIVE
Glucose, UA: NEGATIVE mg/dL
Hgb urine dipstick: NEGATIVE
Ketones, ur: NEGATIVE mg/dL
Nitrite: NEGATIVE
Protein, ur: 100 mg/dL — AB
Specific Gravity, Urine: 1.016 (ref 1.005–1.030)
WBC, UA: >50 WBC/hpf — ABNORMAL HIGH (ref 0–5)
pH: 5 (ref 5.0–8.0)

## 2019-04-28 MED ORDER — CEPHALEXIN 500 MG PO CAPS
500.0000 mg | ORAL_CAPSULE | Freq: Once | ORAL | Status: AC
  Administered 2019-04-28: 500 mg via ORAL
  Filled 2019-04-28: qty 1

## 2019-04-28 MED ORDER — CEPHALEXIN 500 MG PO CAPS: 500.0000 mg | ORAL_CAPSULE | Freq: Two times a day (BID) | ORAL | 0 refills | Status: DC

## 2019-04-28 NOTE — ED Notes (Addendum)
Pt requesting standard drainage bag be switched to leg bag at time of discharge.Pt given new standard drainage bag to use at night. Pt educated on how to empty leg bag, change from leg bag to standard drainage bag, how to take abx prescription, and need for follow-up with urology and PCP. Pt verbalized understanding.

## 2019-04-28 NOTE — Discharge Instructions (Signed)
Take the antibiotic as prescribed.  You will be called if the culture is positive and antibiotic needs to be changed.  Follow-up with the urologist for catheter removal.  Return to the ED with worsening pain, vomiting, fever, inability urinate or any other concerns.

## 2019-04-28 NOTE — ED Triage Notes (Signed)
Pt C/O burning with urination. Pt reports having foley catheter removed yesterday.

## 2019-04-28 NOTE — ED Provider Notes (Signed)
[facility] EMERGENCY DEPARTMENT Provider Note   CSN: [id number] Arrival date & time: 04/28/19  0712     History Chief Complaint  Patient presents with  . Dysuria    Mario Massey is a 74 y.o. male.  Patient here with urgency, frequency, burning with urination, dribbling and feeling of needing to urinate.  States he had a catheter for the past 6 weeks which was removed at the [place] yesterday.  He is able to urinate normally for several hours but has not been able to urinate normally since.  He says he is having a very weak stream the "sprays everywhere" with frequency, pain and burning with attempted urination.  Denies abdominal pain or back pain.  Denies any fever or vomiting.  Denies any chest pain or shortness of breath.  States he was told his PSA was normal but is not sure if he has any prostate issues.  The history is provided by the patient.  Dysuria Presenting symptoms: dysuria and penile pain   Associated symptoms: urinary frequency   Associated symptoms: no abdominal pain, no fever, no flank pain, no nausea and no vomiting        Past Medical History:  Diagnosis Date  . Bipolar disorder ([place])   . Coronary artery disease   . Depression   . Hypertension     Patient Active Problem List   Diagnosis Date Noted  . Stiffness of knee joint 01/17/2012  . Difficulty in walking(719.7) 01/17/2012    Past Surgical History:  Procedure Laterality Date  . KNEE SURGERY     with replacement on right knee       No family history on file.  Social History   Tobacco Use  . Smoking status: Never Smoker  . Smokeless tobacco: Never Used  Substance Use Topics  . Alcohol use: Yes    Comment: occasional  . Drug use: No    Home Medications Prior to Admission medications   Medication Sig Start Date End Date Taking? Authorizing Provider  aspirin 81 MG tablet Take 81 mg by mouth daily.    [provider]  doxazosin (CARDURA) 8 MG tablet Take 8 mg by mouth  at bedtime.    [provider]  DULoxetine (CYMBALTA) 20 MG capsule Take 20 mg by mouth daily.    [provider]  finasteride (PROSCAR) 5 MG tablet Take 5 mg by mouth daily.    [provider]  lisinopril (PRINIVIL,ZESTRIL) 5 MG tablet Take 5 mg by mouth daily.    [provider]  nitroGLYCERIN (NITROSTAT) 0.4 MG SL tablet Place 0.4 mg under the tongue every 5 (five) minutes as needed for chest pain.    [provider]  tamsulosin (FLOMAX) 0.4 MG CAPS Take 0.4 mg by mouth daily.    [provider]  venlafaxine XR (EFFEXOR-XR) 150 MG 24 hr capsule Take 150 mg by mouth daily.    [provider]    Allergies    Patient has no known allergies.  Review of Systems   Review of Systems  Constitutional: Negative for activity change, appetite change, fatigue and fever.  HENT: Negative for congestion and rhinorrhea.   Respiratory: Negative for cough and shortness of breath.   Cardiovascular: Negative for chest pain.  Gastrointestinal: Negative for abdominal pain, nausea and vomiting.  Genitourinary: Positive for decreased urine volume, difficulty urinating, dysuria, frequency, penile pain and urgency. Negative for flank pain and testicular pain.  Musculoskeletal: Negative for arthralgias and myalgias.  Skin:  Negative for rash.  Neurological: Negative for weakness and headaches.   all other systems are negative except as noted in the HPI and PMH.    Physical Exam Updated Vital Signs BP (!) 175/81 (BP Location: Left Arm)   Pulse 98   Temp 97.9 F (36.6 C) (Oral)   Resp 20   Ht 6' (1.829 m)   Wt 123.4 kg   SpO2 100%   BMI 36.89 kg/m   Physical Exam Vitals and nursing note reviewed.  Constitutional:      General: He is not in acute distress.    Appearance: He is well-developed.  HENT:     Head: Normocephalic and atraumatic.     Mouth/Throat:     Pharynx: No oropharyngeal exudate.  Eyes:     Conjunctiva/sclera:  Conjunctivae normal.     Pupils: Pupils are equal, round, and reactive to light.  Neck:     Comments: No meningismus. Cardiovascular:     Rate and Rhythm: Normal rate and regular rhythm.     Heart sounds: Normal heart sounds. No murmur.  Pulmonary:     Effort: Pulmonary effort is normal. No respiratory distress.     Breath sounds: Normal breath sounds.  Abdominal:     Palpations: Abdomen is soft.     Tenderness: There is no abdominal tenderness. There is no guarding or rebound.  Genitourinary:    Comments: Uncircumcised, testicles nontender Musculoskeletal:        General: No tenderness. Normal range of motion.     Cervical back: Normal range of motion and neck supple.  Skin:    General: Skin is warm.  Neurological:     Mental Status: He is alert and oriented to person, place, and time.     Cranial Nerves: No cranial nerve deficit.     Motor: No abnormal muscle tone.     Coordination: Coordination normal.     Comments: No ataxia on finger to nose bilaterally. No pronator drift. 5/5 strength throughout. CN 2-12 intact.Equal grip strength. Sensation intact.   Psychiatric:        Behavior: Behavior normal.     ED Results / Procedures / Treatments   Labs (all labs ordered are listed, but only abnormal results are displayed) Labs Reviewed  URINALYSIS, ROUTINE W REFLEX MICROSCOPIC - Abnormal; Notable for the following components:      Result Value   APPearance TURBID (*)    Protein, ur 100 (*)    Leukocytes,Ua LARGE (*)    WBC, UA >50 (*)    All other components within normal limits  URINE CULTURE    EKG None  Radiology No results found.  Procedures Procedures (including critical care time)  Medications Ordered in ED Medications - No data to display  ED Course  I have reviewed the triage vital signs and the nursing notes.  Pertinent labs & imaging results that were available during my care of the patient were reviewed by me and considered in my medical decision  making (see chart for details).    MDM Rules/Calculators/A&P                      Patient with recent discontinuation of Foley catheter.  With urgency, frequency, dysuria and burning with urination.  Bladder scan 238 mL.  Patient with persistent dysuria and frequency only able to urinate small amounts.  He is agreeable to replacing Foley catheter.  This was done without difficulty.  Urinalysis shows many white cells esterase.  Will send culture.  Urine culture on March 9 grew Enterobacter resistant to Keflex. This was felt to be contaminant.  Repeat urine culture will be sent. Will give antibiotics, followup with urology. Return precautions discussed.    Final Clinical Impression(s) / ED Diagnoses Final diagnoses:  Urinary retention    Rx / DC Orders ED Discharge Orders    None       Jamilex Bohnsack, Jeannett Senior, MD 04/28/19 [phone]

## 2019-05-01 LAB — URINE CULTURE: Culture: >=100000 — AB

## 2019-05-02 ENCOUNTER — Telehealth: Payer: Self-pay | Admitting: *Deleted

## 2019-05-02 NOTE — Telephone Encounter (Signed)
Post ED Visit - Positive Culture Follow-up: Successful Patient Follow-Up  Culture assessed and recommendations reviewed by:  []  , Pharm.D. []  Enzo Bi, Pharm.D., BCPS AQ-ID []  , Pharm.D., BCPS []  Celedonio Miyamoto, .D., BCPS []  [place], .D., BCPS, AAHIVP []  Georgina Pillion, Pharm.D., BCPS, AAHIVP []  [address], PharmD, BCPS []  , PharmD, BCPS []  [place], PharmD, BCPS []  [address], PharmD  Positive urine culture  []  Patient discharged without antimicrobial prescription and treatment is now indicated [x]  Organism is resistant to prescribed ED discharge antimicrobial []  Patient with positive blood cultures  Changes discussed with ED provider , [place] New antibiotic prescription Bactrim DS 1 tab PO BID x 7 days Called to [facility], [name], Lysle Pearl  Contacted patient, date 05/02/2019, time 1350   05/02/2019, 1:49 PM

## 2019-05-02 NOTE — Progress Notes (Signed)
ED Antimicrobial Stewardship Positive Culture Follow Up   Mario Massey is an 74 y.o. male who presented to [facility] on 04/28/2019 with a chief complaint of Burning with urination + urgency, hx of foley cath x 6wks removed day before ED visit.  UCx from 3/9 same as below. Chief Complaint  Patient presents with  . Dysuria    Recent Results (from the past 720 hour(s))  Urine culture     Status: Abnormal   Collection Time: 04/06/19  2:12 PM   Specimen: Urine, Catheterized  Result Value Ref Range Status   Specimen Description URINE, CATHETERIZED  Final   Special Requests   Final    NONE Performed at [facility], [address], [place], [place] [address]    Culture 80,000 COLONIES/mL ENTEROBACTER CLOACAE (A)  Final   Report Status 04/09/2019 FINAL  Final   Organism ID, Bacteria ENTEROBACTER CLOACAE (A)  Final      Susceptibility   Enterobacter cloacae - MIC*    CEFAZOLIN >=64 RESISTANT Resistant     CIPROFLOXACIN <=0.25 SENSITIVE Sensitive     GENTAMICIN <=1 SENSITIVE Sensitive     IMIPENEM 1 SENSITIVE Sensitive     NITROFURANTOIN <=16 SENSITIVE Sensitive     TRIMETH/SULFA <=20 SENSITIVE Sensitive     PIP/TAZO <=4 SENSITIVE Sensitive     * 80,000 COLONIES/mL ENTEROBACTER CLOACAE  Urine Culture     Status: Abnormal   Collection Time: 04/28/19  5:35 AM   Specimen: Urine, Clean Catch  Result Value Ref Range Status   Specimen Description   Final    URINE, CLEAN CATCH Performed at [facility], [address], [place], [place] [address]    Special Requests   Final    NONE Performed at [facility], [address] [place], [place] [address]    Culture >=100,000 COLONIES/mL ENTEROBACTER CLOACAE (A)  Final   Report Status 05/01/2019 FINAL  Final   Organism ID, Bacteria ENTEROBACTER CLOACAE (A)  Final      Susceptibility   Enterobacter cloacae - MIC*    CEFAZOLIN >=64 RESISTANT Resistant     CIPROFLOXACIN <=0.25 SENSITIVE Sensitive     GENTAMICIN <=1 SENSITIVE  Sensitive     IMIPENEM 1 SENSITIVE Sensitive     NITROFURANTOIN <=16 SENSITIVE Sensitive     TRIMETH/SULFA <=20 SENSITIVE Sensitive     PIP/TAZO 8 SENSITIVE Sensitive     * >=100,000 COLONIES/mL ENTEROBACTER CLOACAE    [x]  Treated with Keflex, organism resistant to prescribed antimicrobial  New antibiotic prescription: Bactrim DS BID x 7d  ED Provider: 05/02/2019, 11:46 AM Clinical Pharmacist Monday - Friday phone -  [phone] Saturday - Sunday phone - [phone]

## 2019-05-04 ENCOUNTER — Other Ambulatory Visit: Payer: Self-pay

## 2019-05-04 ENCOUNTER — Encounter (HOSPITAL_COMMUNITY): Payer: Self-pay | Admitting: Emergency Medicine

## 2019-05-04 ENCOUNTER — Emergency Department (HOSPITAL_COMMUNITY)
Admission: EM | Admit: 2019-05-04 | Discharge: 2019-05-04 | Disposition: A | Discharge status: Home / Self Care | Payer: No Typology Code available for payment source | Attending: Emergency Medicine | Admitting: Emergency Medicine

## 2019-05-04 DIAGNOSIS — Z79899 Other long term (current) drug therapy: Secondary | ICD-10-CM | POA: Diagnosis not present

## 2019-05-04 DIAGNOSIS — I10 Essential (primary) hypertension: Secondary | ICD-10-CM | POA: Diagnosis not present

## 2019-05-04 DIAGNOSIS — N4889 Other specified disorders of penis: Secondary | ICD-10-CM | POA: Insufficient documentation

## 2019-05-04 DIAGNOSIS — T839XXA Unspecified complication of genitourinary prosthetic device, implant and graft, initial encounter: Secondary | ICD-10-CM | POA: Insufficient documentation

## 2019-05-04 NOTE — ED Triage Notes (Signed)
Pt C/O pain at the head of his penis. Pt stating "the tube feels like it is too tight."

## 2019-05-04 NOTE — Discharge Instructions (Addendum)
Do not put vaseline on the catheter. You may use KY Jelly or Sugilube or any other water-soluble lubricant as needed.

## 2019-05-04 NOTE — ED Provider Notes (Signed)
[facility] EMERGENCY DEPARTMENT Provider Note   CSN: [id number] Arrival date & time: 05/04/19  6283   History Chief Complaint  Patient presents with  . Foley Catheter Problem    Mario Massey is a 74 y.o. male.  The history is provided by the patient.  He has history of hypertension, coronary artery disease, bipolar disorder and comes in complaining of irritation of his penis from a Foley catheter.  He feels that there is something on it that is irritating the penis.  He has been using Vaseline to try to lubricate it.  Catheter has been in place for 8 days.  He is requesting catheter be changed.  Past Medical History:  Diagnosis Date  . Bipolar disorder ([place])   . Coronary artery disease   . Depression   . Hypertension     Patient Active Problem List   Diagnosis Date Noted  . Stiffness of knee joint 01/17/2012  . Difficulty in walking(719.7) 01/17/2012    Past Surgical History:  Procedure Laterality Date  . KNEE SURGERY     with replacement on right knee       No family history on file.  Social History   Tobacco Use  . Smoking status: Never Smoker  . Smokeless tobacco: Never Used  Substance Use Topics  . Alcohol use: Yes    Comment: occasional  . Drug use: No    Home Medications Prior to Admission medications   Medication Sig Start Date End Date Taking? Authorizing Provider  aspirin 81 MG tablet Take 81 mg by mouth daily.    [provider]  cephALEXin (KEFLEX) 500 MG capsule Take 1 capsule (500 mg total) by mouth 2 (two) times daily. 04/28/19   Rancour, Annie Main, MD  doxazosin (CARDURA) 8 MG tablet Take 8 mg by mouth at bedtime.    [provider]  DULoxetine (CYMBALTA) 20 MG capsule Take 20 mg by mouth daily.    [provider]  finasteride (PROSCAR) 5 MG tablet Take 5 mg by mouth daily.    [provider]  lisinopril (PRINIVIL,ZESTRIL) 5 MG tablet Take 5 mg by mouth daily.    [provider]   nitroGLYCERIN (NITROSTAT) 0.4 MG SL tablet Place 0.4 mg under the tongue every 5 (five) minutes as needed for chest pain.    [provider]  tamsulosin (FLOMAX) 0.4 MG CAPS Take 0.4 mg by mouth daily.    [provider]  venlafaxine XR (EFFEXOR-XR) 150 MG 24 hr capsule Take 150 mg by mouth daily.    [provider]    Allergies    Patient has no known allergies.  Review of Systems   Review of Systems  All other systems reviewed and are negative.   Physical Exam Updated Vital Signs BP (!) 194/56   Pulse 71   Temp 98.2 F (36.8 C)   Resp 18   Ht 6' (1.829 m)   Wt 123.4 kg   SpO2 97%   BMI 36.90 kg/m   Physical Exam Vitals and nursing note reviewed.   74 year old male, resting comfortably and in no acute distress. Vital signs are significant for elevated blood pressure. Oxygen saturation is 97%, which is normal. Head is normocephalic and atraumatic. PERRLA, EOMI. Oropharynx is clear. Neck is nontender and supple without adenopathy or JVD. Back is nontender and there is no CVA tenderness. Lungs are clear without rales, wheezes, or rhonchi. Chest is nontender. Heart has regular rate and rhythm without murmur. Abdomen  is soft, flat, nontender without masses or hepatosplenomegaly and peristalsis is normoactive. Genitalia: Uncircumcised penis with Foley catheter in place.  No obvious irritation seen, but there is some crusted material on the catheter. Extremities have no cyanosis or edema, full range of motion is present. Skin is warm and dry without rash. Neurologic: Mental status is normal, cranial nerves are intact, there are no motor or sensory deficits.  ED Results / Procedures / Treatments    Procedures Procedures   Medications Ordered in ED Medications - No data to display  ED Course  I have reviewed the triage vital signs and the nursing notes.  Irritation of penis from Foley catheter.  Foley catheter is changed out and he is advised  to use a water-soluble agent for lubrication rather than petroleum jelly.  Old records are reviewed, and he has had several recent ED visits related to Foley catheter issues.  MDM Rules/Calculators/A&P  Final Clinical Impression(s) / ED Diagnoses Final diagnoses:  Foley catheter problem, initial encounter (HCC)  Elevated blood pressure reading with diagnosis of hypertension    Rx / DC Orders ED Discharge Orders    None       Dione Booze, MD 05/04/19 [phone]

## 2019-05-30 ENCOUNTER — Emergency Department (HOSPITAL_COMMUNITY)
Admission: EM | Admit: 2019-05-30 | Discharge: 2019-05-30 | Disposition: A | Discharge status: Home / Self Care | Payer: No Typology Code available for payment source | Attending: Emergency Medicine | Admitting: Emergency Medicine

## 2019-05-30 ENCOUNTER — Encounter (HOSPITAL_COMMUNITY): Payer: Self-pay | Admitting: *Deleted

## 2019-05-30 ENCOUNTER — Other Ambulatory Visit: Payer: Self-pay

## 2019-05-30 DIAGNOSIS — Z79899 Other long term (current) drug therapy: Secondary | ICD-10-CM | POA: Insufficient documentation

## 2019-05-30 DIAGNOSIS — Z7982 Long term (current) use of aspirin: Secondary | ICD-10-CM | POA: Diagnosis not present

## 2019-05-30 DIAGNOSIS — I2510 Atherosclerotic heart disease of native coronary artery without angina pectoris: Secondary | ICD-10-CM | POA: Diagnosis not present

## 2019-05-30 DIAGNOSIS — L304 Erythema intertrigo: Secondary | ICD-10-CM | POA: Diagnosis not present

## 2019-05-30 DIAGNOSIS — I10 Essential (primary) hypertension: Secondary | ICD-10-CM | POA: Insufficient documentation

## 2019-05-30 DIAGNOSIS — R21 Rash and other nonspecific skin eruption: Secondary | ICD-10-CM | POA: Diagnosis present

## 2019-05-30 MED ORDER — CLOTRIMAZOLE 1 % EX CREA: TOPICAL_CREAM | CUTANEOUS | 0 refills | Status: DC

## 2019-05-30 NOTE — ED Provider Notes (Signed)
[facility] EMERGENCY DEPARTMENT Provider Note  CSN: [id number] Arrival date & time: 05/30/19 1559    History Chief Complaint  Patient presents with  . Rash    HPI  Romen Yutzy is a 74 y.o. male who presents for evaluation of rash on his L lower abdomen in a skin fold. He reports recent Abx for UTI. Denies diabetes. Has not used any creams or lotions. No fever.    Past Medical History:  Diagnosis Date  . Bipolar disorder (HCC)   . Coronary artery disease   . Depression   . Hypertension     Past Surgical History:  Procedure Laterality Date  . KNEE SURGERY     with replacement on right knee    History reviewed. No pertinent family history.  Social History   Tobacco Use  . Smoking status: Never Smoker  . Smokeless tobacco: Never Used  Substance Use Topics  . Alcohol use: Yes    Comment: occasional  . Drug use: No     Home Medications Prior to Admission medications   Medication Sig Start Date End Date Taking? Authorizing Provider  aspirin 81 MG tablet Take 81 mg by mouth daily.    [provider]  cephALEXin (KEFLEX) 500 MG capsule Take 1 capsule (500 mg total) by mouth 2 (two) times daily. 04/28/19   Rancour, Jeannett Senior, MD  clotrimazole (LOTRIMIN) 1 % cream Apply to affected area 2 times daily 05/30/19   Pollyann Savoy, MD  doxazosin (CARDURA) 8 MG tablet Take 8 mg by mouth at bedtime.    [provider]  DULoxetine (CYMBALTA) 20 MG capsule Take 20 mg by mouth daily.    [provider]  finasteride (PROSCAR) 5 MG tablet Take 5 mg by mouth daily.    [provider]  lisinopril (PRINIVIL,ZESTRIL) 5 MG tablet Take 5 mg by mouth daily.    [provider]  nitroGLYCERIN (NITROSTAT) 0.4 MG SL tablet Place 0.4 mg under the tongue every 5 (five) minutes as needed for chest pain.    [provider]  tamsulosin (FLOMAX) 0.4 MG CAPS Take 0.4 mg by mouth daily.    [provider]  venlafaxine XR  (EFFEXOR-XR) 150 MG 24 hr capsule Take 150 mg by mouth daily.    [provider]     Allergies    Patient has no known allergies.   Review of Systems   Review of Systems All other systems reviewed and are negative except as noted in HPI.    Physical Exam BP (!) 169/58 (BP Location: Right Arm)   Pulse (!) 49   Temp 98.2 F (36.8 C) (Oral)   Resp 16   Ht 6' (1.829 m)   Wt 121.1 kg   SpO2 94%   BMI 36.21 kg/m   Physical Exam Vitals and nursing note reviewed.  HENT:     Head: Normocephalic.     Nose: Nose normal.  Eyes:     Extraocular Movements: Extraocular movements intact.  Pulmonary:     Effort: Pulmonary effort is normal.  Musculoskeletal:        General: Normal range of motion.     Cervical back: Neck supple.  Skin:    Findings: Rash (small area of intertrigo in L lower abdominal skin fold) present.  Neurological:     Mental Status: He is alert and oriented to person, place, and time.  Psychiatric:        Mood and Affect: Mood normal.  ED Results / Procedures / Treatments   Labs (all labs ordered are listed, but only abnormal results are displayed) Labs Reviewed - No data to display  EKG None  Radiology No results found.  Procedures Procedures  Medications Ordered in the ED Medications - No data to display   ED Course  I have reviewed the triage vital signs and the nursing notes.  Pertinent labs & imaging results that were available during my care of the patient were reviewed by me and considered in my medical decision making (see chart for details).  Clinical Course as of May 30 1915  Sun May 30, 2019  3552 Topical antifungal cream recommended for uncomplicated intertrigo.   [CS]    Clinical Course User Index [CS] Truddie Hidden, MD    MDM Rules/Calculators/A&P MDM  Final Clinical Impression(s) / ED Diagnoses Final diagnoses:  Intertrigo    Rx / DC Orders ED Discharge Orders         Ordered    clotrimazole  (LOTRIMIN) 1 % cream     05/30/19 1917           Truddie Hidden, MD 05/30/19 1918

## 2019-05-30 NOTE — ED Notes (Signed)
Pt with redness and excoriation to the area under his pannus   His provider is the [facility] "for the last 13 years"

## 2019-05-30 NOTE — ED Triage Notes (Signed)
Pt with rash under skin fold to left groin.  Pt admits to being on recent antibiotics for an UTI.  C/o itching at times per pt.

## 2019-06-01 ENCOUNTER — Other Ambulatory Visit: Payer: Self-pay

## 2019-06-01 ENCOUNTER — Emergency Department (HOSPITAL_COMMUNITY)
Admission: EM | Admit: 2019-06-01 | Discharge: 2019-06-01 | Discharge status: Absent without leave | Payer: No Typology Code available for payment source

## 2019-06-11 ENCOUNTER — Other Ambulatory Visit: Payer: Self-pay

## 2019-06-11 ENCOUNTER — Emergency Department (HOSPITAL_COMMUNITY)
Admission: EM | Admit: 2019-06-11 | Discharge: 2019-06-11 | Disposition: A | Discharge status: Home / Self Care | Payer: No Typology Code available for payment source

## 2019-08-03 ENCOUNTER — Emergency Department (HOSPITAL_COMMUNITY)
Admission: EM | Admit: 2019-08-03 | Discharge: 2019-08-03 | Disposition: A | Discharge status: Home / Self Care | Payer: No Typology Code available for payment source | Attending: Emergency Medicine | Admitting: Emergency Medicine

## 2019-08-03 ENCOUNTER — Other Ambulatory Visit: Payer: Self-pay

## 2019-08-03 ENCOUNTER — Encounter (HOSPITAL_COMMUNITY): Payer: Self-pay | Admitting: Emergency Medicine

## 2019-08-03 DIAGNOSIS — T83031A Leakage of indwelling urethral catheter, initial encounter: Secondary | ICD-10-CM | POA: Insufficient documentation

## 2019-08-03 DIAGNOSIS — R39198 Other difficulties with micturition: Secondary | ICD-10-CM | POA: Diagnosis present

## 2019-08-03 DIAGNOSIS — T839XXA Unspecified complication of genitourinary prosthetic device, implant and graft, initial encounter: Secondary | ICD-10-CM

## 2019-08-03 NOTE — ED Notes (Signed)
Bladder scan - 138 mL

## 2019-08-03 NOTE — ED Triage Notes (Signed)
Pt was seen at the [place] today to have foley replaced. Now he is having urine leaking from penis around foley.

## 2019-08-03 NOTE — ED Provider Notes (Signed)
[facility] EMERGENCY DEPARTMENT Provider Note   CSN: [id number] Arrival date & time: 08/03/19  1746     History Chief Complaint  Patient presents with  . Foley Leaking    Mario Massey is a 74 y.o. male.  HPI  Patient is a 74 year old male with medical history as noted below.  Patient had his chronic Foley catheter replaced at the [place] today.  Since it was placed, patient states he has had urine leaking around the catheter.  He has no other complaints at this time.  No penile pain, scrotal pain, dysuria, hematuria.     Past Medical History:  Diagnosis Date  . Bipolar disorder (HCC)   . Coronary artery disease   . Depression   . Hypertension     Patient Active Problem List   Diagnosis Date Noted  . Stiffness of knee joint 01/17/2012  . Difficulty in walking(719.7) 01/17/2012    Past Surgical History:  Procedure Laterality Date  . KNEE SURGERY     with replacement on right knee       History reviewed. No pertinent family history.  Social History   Tobacco Use  . Smoking status: Never Smoker  . Smokeless tobacco: Never Used  Substance Use Topics  . Alcohol use: Yes    Comment: occasional  . Drug use: No    Home Medications Prior to Admission medications   Medication Sig Start Date End Date Taking? Authorizing Provider  aspirin 81 MG tablet Take 81 mg by mouth daily.    [provider]  cephALEXin (KEFLEX) 500 MG capsule Take 1 capsule (500 mg total) by mouth 2 (two) times daily. 04/28/19   Rancour, Jeannett Senior, MD  clotrimazole (LOTRIMIN) 1 % cream Apply to affected area 2 times daily 05/30/19   Pollyann Savoy, MD  doxazosin (CARDURA) 8 MG tablet Take 8 mg by mouth at bedtime.    [provider]  DULoxetine (CYMBALTA) 20 MG capsule Take 20 mg by mouth daily.    [provider]  finasteride (PROSCAR) 5 MG tablet Take 5 mg by mouth daily.    [provider]  lisinopril (PRINIVIL,ZESTRIL) 5 MG tablet Take 5 mg by  mouth daily.    [provider]  nitroGLYCERIN (NITROSTAT) 0.4 MG SL tablet Place 0.4 mg under the tongue every 5 (five) minutes as needed for chest pain.    [provider]  tamsulosin (FLOMAX) 0.4 MG CAPS Take 0.4 mg by mouth daily.    [provider]  venlafaxine XR (EFFEXOR-XR) 150 MG 24 hr capsule Take 150 mg by mouth daily.    [provider]    Allergies    Patient has no known allergies.  Review of Systems   Review of Systems  Genitourinary: Positive for difficulty urinating. Negative for decreased urine volume, dysuria, hematuria, penile pain, penile swelling, scrotal swelling, testicular pain and urgency.   Physical Exam Updated Vital Signs BP (!) 158/69 (BP Location: Right Arm)   Pulse 93   Temp 98.3 F (36.8 C) (Oral)   Resp 20   Ht 6' (1.829 m)   Wt 120.2 kg   SpO2 97%   BMI 35.94 kg/m   Physical Exam Vitals and nursing note reviewed.  Constitutional:      General: He is not in acute distress.    Appearance: He is well-developed.  HENT:     Head: Normocephalic and atraumatic.     Right Ear: External ear normal.     Left Ear:  External ear normal.  Eyes:     General: No scleral icterus.       Right eye: No discharge.        Left eye: No discharge.     Conjunctiva/sclera: Conjunctivae normal.  Neck:     Trachea: No tracheal deviation.  Cardiovascular:     Rate and Rhythm: Normal rate.  Pulmonary:     Effort: Pulmonary effort is normal. No respiratory distress.     Breath sounds: No stridor.  Abdominal:     General: Abdomen is flat. There is no distension.     Palpations: Abdomen is soft.     Tenderness: There is no abdominal tenderness.  Genitourinary:    Comments: Chaperone present.  Foley catheter in place with small amount of urine noted leaking around the catheter.  Minimal urine noted in the catheter bag.  No penile or scrotal pain appreciated.  No erythema or discharge. Musculoskeletal:        General: No  swelling or deformity.     Cervical back: Neck supple.  Skin:    General: Skin is warm and dry.     Findings: No rash.  Neurological:     Mental Status: He is alert.     Cranial Nerves: Cranial nerve deficit: no gross deficits.    ED Results / Procedures / Treatments   Labs (all labs ordered are listed, but only abnormal results are displayed) Labs Reviewed - No data to display  EKG None  Radiology No results found.  Procedures Procedures (including critical care time)  Medications Ordered in ED Medications - No data to display  ED Course  I have reviewed the triage vital signs and the nursing notes.  Pertinent labs & imaging results that were available during my care of the patient were reviewed by me and considered in my medical decision making (see chart for details).    MDM Rules/Calculators/A&P                          Patient is a 74 year old male with a history and physical exam as noted above.  Patient had a new Foley placed today at the [place] and has been leaking a small amount of urine around the catheter since it was placed.  Nursing staff today bladder scan showing 138 mL of urine in the bladder.  Patient is having no abdominal tenderness.  No GU complaints at this time.  No dysuria.  I had the nursing staff place a new Foley catheter.  Patient tolerated the procedure well and noted resolution of his symptoms.  He was discharged with strict return precautions.  He understands he can return to the emergency department with any new or worsening symptoms.  His questions were answered and he was amicable to time of discharge.  His vital signs stable.  Patient discharged to home/self care.  Condition at discharge: Stable  Note: Portions of this report may have been transcribed using voice recognition software. Every effort was made to ensure accuracy; however, inadvertent computerized transcription errors may be present.    Final Clinical Impression(s) / ED  Diagnoses Final diagnoses:  Problem with Foley catheter, initial encounter [facility])   Rx / DC Orders ED Discharge Orders    None       Placido Sou, Cordelia Poche 08/03/19 2207    Terrilee Files, MD 08/04/19 1054

## 2019-08-28 ENCOUNTER — Other Ambulatory Visit: Payer: Self-pay

## 2019-08-28 ENCOUNTER — Emergency Department (HOSPITAL_COMMUNITY)
Admission: EM | Admit: 2019-08-28 | Discharge: 2019-08-28 | Disposition: A | Discharge status: Home / Self Care | Payer: No Typology Code available for payment source | Attending: Emergency Medicine | Admitting: Emergency Medicine

## 2019-08-28 ENCOUNTER — Encounter (HOSPITAL_COMMUNITY): Payer: Self-pay | Admitting: Emergency Medicine

## 2019-08-28 DIAGNOSIS — Z7982 Long term (current) use of aspirin: Secondary | ICD-10-CM | POA: Insufficient documentation

## 2019-08-28 DIAGNOSIS — I10 Essential (primary) hypertension: Secondary | ICD-10-CM | POA: Diagnosis not present

## 2019-08-28 DIAGNOSIS — I2510 Atherosclerotic heart disease of native coronary artery without angina pectoris: Secondary | ICD-10-CM | POA: Diagnosis not present

## 2019-08-28 DIAGNOSIS — T839XXA Unspecified complication of genitourinary prosthetic device, implant and graft, initial encounter: Secondary | ICD-10-CM

## 2019-08-28 DIAGNOSIS — Z79899 Other long term (current) drug therapy: Secondary | ICD-10-CM | POA: Insufficient documentation

## 2019-08-28 DIAGNOSIS — T83098A Other mechanical complication of other indwelling urethral catheter, initial encounter: Secondary | ICD-10-CM | POA: Insufficient documentation

## 2019-08-28 NOTE — Discharge Instructions (Addendum)
Return for any new or worsening symptoms.

## 2019-08-28 NOTE — ED Provider Notes (Signed)
[facility] EMERGENCY DEPARTMENT Provider Note   CSN: [id number] Arrival date & time: 08/28/19  1445     History Chief Complaint  Patient presents with  . catheter problem    Mario Massey is a 74 y.o. male.  Who presents emergency department because he needs his catheter bag changed.  He has no other complaints.  He has a chronic indwelling Foley catheter.  HPI     Past Medical History:  Diagnosis Date  . Bipolar disorder (HCC)   . Coronary artery disease   . Depression   . Hypertension     Patient Active Problem List   Diagnosis Date Noted  . Stiffness of knee joint 01/17/2012  . Difficulty in walking(719.7) 01/17/2012    Past Surgical History:  Procedure Laterality Date  . KNEE SURGERY     with replacement on right knee       No family history on file.  Social History   Tobacco Use  . Smoking status: Never Smoker  . Smokeless tobacco: Never Used  Substance Use Topics  . Alcohol use: Yes    Comment: occasional  . Drug use: No    Home Medications Prior to Admission medications   Medication Sig Start Date End Date Taking? Authorizing Provider  aspirin 81 MG tablet Take 81 mg by mouth daily.    [provider]  cephALEXin (KEFLEX) 500 MG capsule Take 1 capsule (500 mg total) by mouth 2 (two) times daily. 04/28/19   Rancour, Jeannett Senior, MD  clotrimazole (LOTRIMIN) 1 % cream Apply to affected area 2 times daily 05/30/19   Pollyann Savoy, MD  doxazosin (CARDURA) 8 MG tablet Take 8 mg by mouth at bedtime.    [provider]  DULoxetine (CYMBALTA) 20 MG capsule Take 20 mg by mouth daily.    [provider]  finasteride (PROSCAR) 5 MG tablet Take 5 mg by mouth daily.    [provider]  lisinopril (PRINIVIL,ZESTRIL) 5 MG tablet Take 5 mg by mouth daily.    [provider]  nitroGLYCERIN (NITROSTAT) 0.4 MG SL tablet Place 0.4 mg under the tongue every 5 (five) minutes as needed for chest pain.    [provider]  tamsulosin (FLOMAX) 0.4 MG CAPS Take 0.4 mg by mouth daily.    [provider]  venlafaxine XR (EFFEXOR-XR) 150 MG 24 hr capsule Take 150 mg by mouth daily.    [provider]    Allergies    Patient has no known allergies.  Review of Systems   Review of Systems Negative for dysuria, fever, chills, body aches, hematuria Physical Exam Updated Vital Signs BP (!) 114/96   Pulse 82   Temp 98.2 F (36.8 C) (Oral)   Resp 17   Ht 6' (1.829 m)   Wt (!) 120.2 kg   SpO2 96%   BMI 35.94 kg/m   Physical Exam Vitals and nursing note reviewed.  Constitutional:      General: He is not in acute distress.    Appearance: He is well-developed. He is not diaphoretic.  HENT:     Head: Normocephalic and atraumatic.  Eyes:     General: No scleral icterus.    Conjunctiva/sclera: Conjunctivae normal.  Cardiovascular:     Rate and Rhythm: Normal rate and regular rhythm.     Heart sounds: Normal heart sounds.  Pulmonary:     Effort: Pulmonary effort is normal. No respiratory distress.     Breath sounds: Normal breath sounds.  Abdominal:     Palpations: Abdomen is soft.     Tenderness: There is no abdominal tenderness.  Genitourinary:    Comments: Indwelling Foley catheter in place Musculoskeletal:     Cervical back: Normal range of motion and neck supple.  Skin:    General: Skin is warm and dry.  Neurological:     Mental Status: He is alert.  Psychiatric:        Behavior: Behavior normal.     ED Results / Procedures / Treatments   Labs (all labs ordered are listed, but only abnormal results are displayed) Labs Reviewed - No data to display  EKG None  Radiology No results found.  Procedures Procedures (including critical care time)  Medications Ordered in ED Medications - No data to display  ED Course  I have reviewed the triage vital signs and the nursing notes.  Pertinent labs & imaging results that were available during my care of  the patient were reviewed by me and considered in my medical decision making (see chart for details).    MDM Rules/Calculators/A&P                         Patient here with catheter bag leaking and asked to have a change.  This was changed at vertical triage.  He has no other complaints at this time.  He is hemodynamically stable and appears appropriate for discharge Final Clinical Impression(s) / ED Diagnoses Final diagnoses:  None    Rx / DC Orders ED Discharge Orders    None       Arthor Captain, PA-C 08/28/19 2159    Pricilla Loveless, MD 08/31/19 [phone]

## 2019-08-28 NOTE — ED Triage Notes (Signed)
Pt needs bag replaced

## 2019-09-13 ENCOUNTER — Emergency Department (HOSPITAL_COMMUNITY)
Admission: EM | Admit: 2019-09-13 | Discharge: 2019-09-13 | Disposition: A | Discharge status: Home / Self Care | Payer: No Typology Code available for payment source | Attending: Emergency Medicine | Admitting: Emergency Medicine

## 2019-09-13 ENCOUNTER — Other Ambulatory Visit: Payer: Self-pay

## 2019-09-13 DIAGNOSIS — Z960 Presence of urogenital implants: Secondary | ICD-10-CM | POA: Diagnosis present

## 2019-09-13 DIAGNOSIS — Z5321 Procedure and treatment not carried out due to patient leaving prior to being seen by health care provider: Secondary | ICD-10-CM | POA: Insufficient documentation

## 2019-09-13 NOTE — ED Triage Notes (Signed)
Leg bag replaced. 

## 2019-10-19 ENCOUNTER — Encounter (HOSPITAL_COMMUNITY): Payer: Self-pay | Admitting: Emergency Medicine

## 2019-10-19 ENCOUNTER — Emergency Department (HOSPITAL_COMMUNITY)
Admission: EM | Admit: 2019-10-19 | Discharge: 2019-10-19 | Disposition: A | Discharge status: Home / Self Care | Payer: No Typology Code available for payment source | Attending: Emergency Medicine | Admitting: Emergency Medicine

## 2019-10-19 ENCOUNTER — Other Ambulatory Visit: Payer: Self-pay

## 2019-10-19 DIAGNOSIS — N3000 Acute cystitis without hematuria: Secondary | ICD-10-CM | POA: Insufficient documentation

## 2019-10-19 DIAGNOSIS — Z79899 Other long term (current) drug therapy: Secondary | ICD-10-CM | POA: Insufficient documentation

## 2019-10-19 DIAGNOSIS — I2510 Atherosclerotic heart disease of native coronary artery without angina pectoris: Secondary | ICD-10-CM | POA: Insufficient documentation

## 2019-10-19 DIAGNOSIS — I10 Essential (primary) hypertension: Secondary | ICD-10-CM | POA: Insufficient documentation

## 2019-10-19 DIAGNOSIS — Z7982 Long term (current) use of aspirin: Secondary | ICD-10-CM | POA: Diagnosis not present

## 2019-10-19 DIAGNOSIS — N3090 Cystitis, unspecified without hematuria: Secondary | ICD-10-CM

## 2019-10-19 LAB — URINALYSIS, ROUTINE W REFLEX MICROSCOPIC
Glucose, UA: NEGATIVE mg/dL
Hgb urine dipstick: NEGATIVE
Ketones, ur: NEGATIVE mg/dL
Nitrite: NEGATIVE
Protein, ur: >300 mg/dL — AB
Specific Gravity, Urine: <1.005 — ABNORMAL LOW (ref 1.005–1.030)
pH: >9 — ABNORMAL HIGH (ref 5.0–8.0)

## 2019-10-19 LAB — URINALYSIS, MICROSCOPIC (REFLEX)
RBC / HPF: NONE SEEN RBC/hpf (ref 0–5)
Squamous Epithelial / HPF: NONE SEEN (ref 0–5)
WBC, UA: >50 WBC/hpf (ref 0–5)

## 2019-10-19 MED ORDER — CEFTRIAXONE SODIUM 1 G IJ SOLR
1.0000 g | Freq: Once | INTRAMUSCULAR | Status: AC
  Administered 2019-10-19: 1 g via INTRAMUSCULAR
  Filled 2019-10-19: qty 10

## 2019-10-19 MED ORDER — HYDROCODONE-ACETAMINOPHEN 5-325 MG PO TABS: 1.0000 | ORAL_TABLET | Freq: Four times a day (QID) | ORAL | 0 refills | Status: DC | PRN

## 2019-10-19 MED ORDER — LIDOCAINE HCL (PF) 1 % IJ SOLN
INTRAMUSCULAR | Status: AC
  Administered 2019-10-19: 2.1 mL
  Filled 2019-10-19: qty 5

## 2019-10-19 MED ORDER — CEPHALEXIN 500 MG PO CAPS: 500.0000 mg | ORAL_CAPSULE | Freq: Four times a day (QID) | ORAL | 0 refills | Status: DC

## 2019-10-19 MED ORDER — HYDROCODONE-ACETAMINOPHEN 5-325 MG PO TABS
1.0000 | ORAL_TABLET | Freq: Once | ORAL | Status: AC
  Administered 2019-10-19: 1 via ORAL
  Filled 2019-10-19: qty 1

## 2019-10-19 NOTE — ED Provider Notes (Signed)
[facility] EMERGENCY DEPARTMENT Provider Note   CSN: [id number] Arrival date & time: 10/19/19  0258     History Chief Complaint  Patient presents with  . Dysuria    Mario Massey is a 74 y.o. male.  Patient complains of dysuria.  Patient has a Foley.  The history is provided by the patient and medical records. No language interpreter was used.  Dysuria Presenting symptoms: dysuria   Context: not after injury   Relieved by:  Nothing Worsened by:  Nothing Ineffective treatments:  None tried Associated symptoms: abdominal pain   Associated symptoms: no diarrhea, no hematuria and no urinary frequency   Risk factors: no change in medication        Past Medical History:  Diagnosis Date  . Bipolar disorder (HCC)   . Coronary artery disease   . Depression   . Hypertension     Patient Active Problem List   Diagnosis Date Noted  . Stiffness of knee joint 01/17/2012  . Difficulty in walking(719.7) 01/17/2012    Past Surgical History:  Procedure Laterality Date  . KNEE SURGERY     with replacement on right knee       No family history on file.  Social History   Tobacco Use  . Smoking status: Never Smoker  . Smokeless tobacco: Never Used  Substance Use Topics  . Alcohol use: Yes    Comment: occasional  . Drug use: No    Home Medications Prior to Admission medications   Medication Sig Start Date End Date Taking? Authorizing Provider  aspirin 81 MG tablet Take 81 mg by mouth daily.    [provider]  cephALEXin (KEFLEX) 500 MG capsule Take 1 capsule (500 mg total) by mouth 4 (four) times daily. 10/19/19   Bethann Berkshire, MD  clotrimazole (LOTRIMIN) 1 % cream Apply to affected area 2 times daily 05/30/19   Pollyann Savoy, MD  doxazosin (CARDURA) 8 MG tablet Take 8 mg by mouth at bedtime.    [provider]  DULoxetine (CYMBALTA) 20 MG capsule Take 20 mg by mouth daily.    [provider]  finasteride (PROSCAR) 5 MG  tablet Take 5 mg by mouth daily.    [provider]  HYDROcodone-acetaminophen (NORCO/VICODIN) 5-325 MG tablet Take 1 tablet by mouth every 6 (six) hours as needed. 10/19/19   Bethann Berkshire, MD  lisinopril (PRINIVIL,ZESTRIL) 5 MG tablet Take 5 mg by mouth daily.    [provider]  nitroGLYCERIN (NITROSTAT) 0.4 MG SL tablet Place 0.4 mg under the tongue every 5 (five) minutes as needed for chest pain.    [provider]  tamsulosin (FLOMAX) 0.4 MG CAPS Take 0.4 mg by mouth daily.    [provider]  venlafaxine XR (EFFEXOR-XR) 150 MG 24 hr capsule Take 150 mg by mouth daily.    [provider]    Allergies    Patient has no known allergies.  Review of Systems   Review of Systems  Constitutional: Negative for appetite change and fatigue.  HENT: Negative for congestion, ear discharge and sinus pressure.   Eyes: Negative for discharge.  Respiratory: Negative for cough.   Cardiovascular: Negative for chest pain.  Gastrointestinal: Positive for abdominal pain. Negative for diarrhea.  Genitourinary: Positive for dysuria. Negative for frequency and hematuria.  Musculoskeletal: Negative for back pain.  Skin: Negative for rash.  Neurological: Negative for seizures and headaches.  Psychiatric/Behavioral: Negative for hallucinations.    Physical Exam Updated Vital Signs  BP (!) 187/85 (BP Location: Right Arm)   Pulse (!) 54   Temp 98.3 F (36.8 C) (Oral)   Resp 20   Ht 6' (1.829 m)   Wt 117.9 kg   SpO2 94%   BMI 35.26 kg/m   Physical Exam Vitals and nursing note reviewed.  Constitutional:      Appearance: He is well-developed.  HENT:     Head: Normocephalic.     Nose: Nose normal.  Eyes:     General: No scleral icterus.    Conjunctiva/sclera: Conjunctivae normal.  Neck:     Thyroid: No thyromegaly.  Cardiovascular:     Rate and Rhythm: Normal rate and regular rhythm.     Heart sounds: No murmur heard.  No friction rub. No gallop.    Pulmonary:     Breath sounds: No stridor. No wheezing or rales.  Chest:     Chest wall: No tenderness.  Abdominal:     General: There is no distension.     Tenderness: There is no abdominal tenderness. There is no rebound.  Genitourinary:    Comments: Foley in place Musculoskeletal:        General: Normal range of motion.     Cervical back: Neck supple.  Lymphadenopathy:     Cervical: No cervical adenopathy.  Skin:    Findings: No erythema or rash.  Neurological:     Mental Status: He is alert and oriented to person, place, and time.     Motor: No abnormal muscle tone.     Coordination: Coordination normal.  Psychiatric:        Behavior: Behavior normal.     ED Results / Procedures / Treatments   Labs (all labs ordered are listed, but only abnormal results are displayed) Labs Reviewed  URINALYSIS, ROUTINE W REFLEX MICROSCOPIC - Abnormal; Notable for the following components:      Result Value   Specific Gravity, Urine <1.005 (*)    pH >9.0 (*)    Bilirubin Urine SMALL (*)    Protein, ur >300 (*)    Leukocytes,Ua MODERATE (*)    All other components within normal limits  URINALYSIS, MICROSCOPIC (REFLEX) - Abnormal; Notable for the following components:   Bacteria, UA MANY (*)    All other components within normal limits  URINE CULTURE    EKG None  Radiology No results found.  Procedures Procedures (including critical care time)  Medications Ordered in ED Medications  cefTRIAXone (ROCEPHIN) injection 1 g (has no administration in time range)  HYDROcodone-acetaminophen (NORCO/VICODIN) 5-325 MG per tablet 1 tablet (has no administration in time range)    ED Course  I have reviewed the triage vital signs and the nursing notes.  Pertinent labs & imaging results that were available during my care of the patient were reviewed by me and considered in my medical decision making (see chart for details).    MDM Rules/Calculators/A&P                           Patient with a Foley.  Lab show urinary tract infection.  He is given a gram of Rocephin and Keflex and Vicodin and will follow up with his doctor       This patient presents to the ED for concern of dysuria, this involves an extensive number of treatment options, and is a complaint that carries with it a high risk of complications and morbidity.  The differential diagnosis includes UTI bladder  cancer   Lab Tests:   I Ordered, reviewed, and interpreted labs, which included UA shows urinary tract infection  Medicines ordered:   I ordered medication Rocephin for UTI  Imaging Studies ordered:     Additional history obtained:   Additional history obtained from records  Previous records obtained and reviewed.  Consultations Obtained:     Reevaluation:  After the interventions stated above, I reevaluated the patient and found moderate improvement  Critical Interventions:  .   Final Clinical Impression(s) / ED Diagnoses Final diagnoses:  Cystitis    Rx / DC Orders ED Discharge Orders         Ordered    cephALEXin (KEFLEX) 500 MG capsule  4 times daily        10/19/19 0759    HYDROcodone-acetaminophen (NORCO/VICODIN) 5-325 MG tablet  Every 6 hours PRN        10/19/19 0759           Bethann Berkshire, MD 10/19/19 [phone]

## 2019-10-19 NOTE — Discharge Instructions (Addendum)
Follow up with your md next week. °

## 2019-10-19 NOTE — ED Triage Notes (Signed)
Pt presents to ED C/O UTI. Pt states "I hurt in between my penis and my anus." Pt is wearing foley catheter.

## 2019-10-20 LAB — URINE CULTURE

## 2019-11-24 ENCOUNTER — Other Ambulatory Visit: Payer: Self-pay

## 2019-11-24 ENCOUNTER — Emergency Department (HOSPITAL_COMMUNITY)
Admission: EM | Admit: 2019-11-24 | Discharge: 2019-11-24 | Disposition: A | Discharge status: Home / Self Care | Payer: No Typology Code available for payment source | Attending: Emergency Medicine | Admitting: Emergency Medicine

## 2019-11-24 ENCOUNTER — Encounter (HOSPITAL_COMMUNITY): Payer: Self-pay

## 2019-11-24 DIAGNOSIS — Z79899 Other long term (current) drug therapy: Secondary | ICD-10-CM | POA: Diagnosis not present

## 2019-11-24 DIAGNOSIS — I10 Essential (primary) hypertension: Secondary | ICD-10-CM | POA: Insufficient documentation

## 2019-11-24 DIAGNOSIS — T839XXA Unspecified complication of genitourinary prosthetic device, implant and graft, initial encounter: Secondary | ICD-10-CM

## 2019-11-24 DIAGNOSIS — G8929 Other chronic pain: Secondary | ICD-10-CM | POA: Diagnosis not present

## 2019-11-24 DIAGNOSIS — Z96651 Presence of right artificial knee joint: Secondary | ICD-10-CM | POA: Insufficient documentation

## 2019-11-24 DIAGNOSIS — Y629 Failure of sterile precautions during unspecified surgical and medical care: Secondary | ICD-10-CM | POA: Insufficient documentation

## 2019-11-24 DIAGNOSIS — I2510 Atherosclerotic heart disease of native coronary artery without angina pectoris: Secondary | ICD-10-CM | POA: Diagnosis not present

## 2019-11-24 DIAGNOSIS — Z7982 Long term (current) use of aspirin: Secondary | ICD-10-CM | POA: Insufficient documentation

## 2019-11-24 DIAGNOSIS — T83031A Leakage of indwelling urethral catheter, initial encounter: Secondary | ICD-10-CM | POA: Diagnosis present

## 2019-11-24 NOTE — ED Provider Notes (Signed)
[facility] EMERGENCY DEPARTMENT Provider Note   CSN: [id number] Arrival date & time: 11/24/19  1514     History Chief Complaint  Patient presents with  . Urinary Retention    Mario Massey is a 74 y.o. male with chronic indwelling Foley catheter who presents for evaluation of leaking leg bag.  Patient states he woke up this morning and has had leaking surrounding his leg bag.  He has not had any dysuria, hematuria, abdominal pain, fever, chills, nausea, vomiting, no urinary frequency, no changes in medications.  He is followed by the [facility] urologist for this.  Denies additional aggravating or alleviating factors.  He does not have supplies at home to change his leg bag.  History obtained from patient and past medical records.  No interpreter is used. HPI     Past Medical History:  Diagnosis Date  . Bipolar disorder (HCC)   . Coronary artery disease   . Depression   . Hypertension     Patient Active Problem List   Diagnosis Date Noted  . Stiffness of knee joint 01/17/2012  . Difficulty in walking(719.7) 01/17/2012    Past Surgical History:  Procedure Laterality Date  . KNEE SURGERY     with replacement on right knee       History reviewed. No pertinent family history.  Social History   Tobacco Use  . Smoking status: Never Smoker  . Smokeless tobacco: Never Used  Substance Use Topics  . Alcohol use: Yes    Comment: occasional  . Drug use: No    Home Medications Prior to Admission medications   Medication Sig Start Date End Date Taking? Authorizing Provider  aspirin 81 MG tablet Take 81 mg by mouth daily.    [provider]  cephALEXin (KEFLEX) 500 MG capsule Take 1 capsule (500 mg total) by mouth 4 (four) times daily. 10/19/19   Bethann Berkshire, MD  clotrimazole (LOTRIMIN) 1 % cream Apply to affected area 2 times daily 05/30/19   Pollyann Savoy, MD  doxazosin (CARDURA) 8 MG tablet Take 8 mg by mouth at bedtime.    [provider]  DULoxetine (CYMBALTA) 20 MG capsule Take 20 mg by mouth daily.    [provider]  finasteride (PROSCAR) 5 MG tablet Take 5 mg by mouth daily.    [provider]  HYDROcodone-acetaminophen (NORCO/VICODIN) 5-325 MG tablet Take 1 tablet by mouth every 6 (six) hours as needed. 10/19/19   Bethann Berkshire, MD  lisinopril (PRINIVIL,ZESTRIL) 5 MG tablet Take 5 mg by mouth daily.    [provider]  nitroGLYCERIN (NITROSTAT) 0.4 MG SL tablet Place 0.4 mg under the tongue every 5 (five) minutes as needed for chest pain.    [provider]  tamsulosin (FLOMAX) 0.4 MG CAPS Take 0.4 mg by mouth daily.    [provider]  venlafaxine XR (EFFEXOR-XR) 150 MG 24 hr capsule Take 150 mg by mouth daily.    [provider]    Allergies    Patient has no known allergies.  Review of Systems   Review of Systems  Constitutional: Negative.   HENT: Negative.   Respiratory: Negative.   Cardiovascular: Negative.   Gastrointestinal: Negative.   Genitourinary: Negative.   Musculoskeletal: Negative.   Skin: Negative.   Neurological: Negative.   All other systems reviewed and are negative.   Physical Exam Updated Vital Signs BP (!) 155/102 (BP Location: Right Arm)   Pulse (!) 50   Temp 98.1  F (36.7 C) (Oral)   Resp 18   Ht 6' (1.829 m)   Wt 115.7 kg   SpO2 96%   BMI 34.58 kg/m   Physical Exam Vitals and nursing note reviewed.  Constitutional:      General: He is not in acute distress.    Appearance: He is well-developed. He is not ill-appearing, toxic-appearing or diaphoretic.  HENT:     Head: Normocephalic and atraumatic.     Nose: Nose normal.     Mouth/Throat:     Mouth: Mucous membranes are moist.  Eyes:     Pupils: Pupils are equal, round, and reactive to light.  Cardiovascular:     Rate and Rhythm: Normal rate and regular rhythm.     Pulses: Normal pulses.     Heart sounds: Normal heart sounds.  Pulmonary:     Effort:  Pulmonary effort is normal. No respiratory distress.     Breath sounds: Normal breath sounds.  Abdominal:     General: Bowel sounds are normal. There is no distension.     Palpations: Abdomen is soft.     Tenderness: There is no abdominal tenderness. There is no right CVA tenderness, left CVA tenderness, guarding or rebound.  Musculoskeletal:        General: No swelling, tenderness, deformity or signs of injury. Normal range of motion.     Cervical back: Normal range of motion and neck supple.     Right lower leg: No edema.     Left lower leg: No edema.  Skin:    General: Skin is warm and dry.     Capillary Refill: Capillary refill takes less than 2 seconds.     Comments: Leg bag to left leg  Neurological:     General: No focal deficit present.     Mental Status: He is alert and oriented to person, place, and time.     ED Results / Procedures / Treatments   Labs (all labs ordered are listed, but only abnormal results are displayed) Labs Reviewed - No data to display  EKG None  Radiology No results found.  Procedures Procedures (including critical care time)  Medications Ordered in ED Medications - No data to display  ED Course  I have reviewed the triage vital signs and the nursing notes.  Pertinent labs & imaging results that were available during my care of the patient were reviewed by me and considered in my medical decision making (see chart for details).  74 year old presents for evaluation of Foley catheter leaking.  Patient with chronic indwelling Foley catheter.  He is followed by the [facility] for this.  Woke up this morning and noticed his leg bag was leaking.  He did not have supplies to change this.  Has not noted any fever, chills, nausea, vomiting, abdominal pain, dysuria, hematuria, frequency.  Patient's Foley catheter was changed prior to my evaluation.  He has no additional complaints.  Foley catheter flowing without difficulty.  He is  hemodynamically stable.  Has had some bradycardia here however this is consistent with his prior ED visits.  He denies chest pain, shortness of breath, lightheadedness or dizziness.  Recheck my evaluation patient's heart rate 63.   Patient stable for discharge home.  The patient has been appropriately medically screened and/or stabilized in the ED. I have low suspicion for any other emergent medical condition which would require further screening, evaluation or treatment in the ED or require inpatient management.  Patient is hemodynamically  stable and in no acute distress.  Patient able to ambulate in department prior to ED.  Evaluation does not show acute pathology that would require ongoing or additional emergent interventions while in the emergency department or further inpatient treatment.  I have discussed the diagnosis with the patient and answered all questions.  Pain is been managed while in the emergency department and patient has no further complaints prior to discharge.  Patient is comfortable with plan discussed in room and is stable for discharge at this time.  I have discussed strict return precautions for returning to the emergency department.  Patient was encouraged to follow-up with PCP/specialist refer to at discharge.      MDM Rules/Calculators/A&P                          Final Clinical Impression(s) / ED Diagnoses Final diagnoses:  Complication of Foley catheter, initial encounter [facility])    Rx / DC Orders ED Discharge Orders    None       Ghalia Reicks A, PA-C 11/24/19 1554    Jacalyn Lefevre, MD 11/24/19 1609

## 2019-11-24 NOTE — Discharge Instructions (Signed)
Follow up with your Urologist at the [facility]  Return for new or worsening symptoms

## 2019-11-24 NOTE — ED Triage Notes (Signed)
Pt to er, pt states that he has has had a catheter in for the past three weeks, states that the leg bag is leaking and he needs a new leg bag.  States that it is draining well, just the bag is leaking.

## 2019-11-24 NOTE — ED Notes (Signed)
Leg bag replaced, pt states that it is working well, foley appears to be draining appropriately, pt states that he is ready to go home, pa aware of hr and bp

## 2019-12-06 ENCOUNTER — Other Ambulatory Visit: Payer: Self-pay

## 2019-12-06 ENCOUNTER — Emergency Department (HOSPITAL_COMMUNITY)
Admission: EM | Admit: 2019-12-06 | Discharge: 2019-12-06 | Disposition: A | Discharge status: Home / Self Care | Payer: No Typology Code available for payment source | Attending: Emergency Medicine | Admitting: Emergency Medicine

## 2019-12-06 ENCOUNTER — Encounter (HOSPITAL_COMMUNITY): Payer: Self-pay

## 2019-12-06 DIAGNOSIS — T83038A Leakage of other indwelling urethral catheter, initial encounter: Secondary | ICD-10-CM | POA: Diagnosis present

## 2019-12-06 DIAGNOSIS — I10 Essential (primary) hypertension: Secondary | ICD-10-CM | POA: Insufficient documentation

## 2019-12-06 DIAGNOSIS — Y732 Prosthetic and other implants, materials and accessory gastroenterology and urology devices associated with adverse incidents: Secondary | ICD-10-CM | POA: Insufficient documentation

## 2019-12-06 DIAGNOSIS — Z79899 Other long term (current) drug therapy: Secondary | ICD-10-CM | POA: Diagnosis not present

## 2019-12-06 DIAGNOSIS — I2510 Atherosclerotic heart disease of native coronary artery without angina pectoris: Secondary | ICD-10-CM | POA: Diagnosis not present

## 2019-12-06 DIAGNOSIS — Z466 Encounter for fitting and adjustment of urinary device: Secondary | ICD-10-CM

## 2019-12-06 DIAGNOSIS — Z7982 Long term (current) use of aspirin: Secondary | ICD-10-CM | POA: Insufficient documentation

## 2019-12-06 NOTE — ED Triage Notes (Signed)
Pt reports on off valve on catheter bag is leaking.  Changed leg bag.  Pt tolerated well.

## 2019-12-06 NOTE — ED Provider Notes (Signed)
[facility] EMERGENCY DEPARTMENT Provider Note   CSN: [id number] Arrival date & time: 12/06/19  1519     History Chief Complaint  Patient presents with  . catheter bag leaking    Mario Massey is a 74 y.o. male.  HPI   Patient with significant medical history of CAD, depression, hypertension presents to the emergency department with chief complaint of leaking urinary catheter bag.  He states his bag started to leak earlier today and he has been unable to stop the leak.  He has no other complaints at this time.  He states this has happened in the past and has came here to have it placed.  He sees his urology once a month and is scheduled to see them at the end of this month.  He has no other complaints at this time.  He denies headaches, fevers, chills, shortness of breath, chest pain, abdominal pain, nausea, vomiting, diarrhea, pedal edema.  Past Medical History:  Diagnosis Date  . Bipolar disorder (HCC)   . Coronary artery disease   . Depression   . Hypertension     Patient Active Problem List   Diagnosis Date Noted  . Stiffness of knee joint 01/17/2012  . Difficulty in walking(719.7) 01/17/2012    Past Surgical History:  Procedure Laterality Date  . KNEE SURGERY     with replacement on right knee       No family history on file.  Social History   Tobacco Use  . Smoking status: Never Smoker  . Smokeless tobacco: Never Used  Substance Use Topics  . Alcohol use: Yes    Comment: occasional  . Drug use: No    Home Medications Prior to Admission medications   Medication Sig Start Date End Date Taking? Authorizing Provider  aspirin 81 MG tablet Take 81 mg by mouth daily.    [provider]  cephALEXin (KEFLEX) 500 MG capsule Take 1 capsule (500 mg total) by mouth 4 (four) times daily. 10/19/19   Bethann Berkshire, MD  clotrimazole (LOTRIMIN) 1 % cream Apply to affected area 2 times daily 05/30/19   Pollyann Savoy, MD  doxazosin (CARDURA) 8 MG  tablet Take 8 mg by mouth at bedtime.    [provider]  DULoxetine (CYMBALTA) 20 MG capsule Take 20 mg by mouth daily.    [provider]  finasteride (PROSCAR) 5 MG tablet Take 5 mg by mouth daily.    [provider]  HYDROcodone-acetaminophen (NORCO/VICODIN) 5-325 MG tablet Take 1 tablet by mouth every 6 (six) hours as needed. 10/19/19   Bethann Berkshire, MD  lisinopril (PRINIVIL,ZESTRIL) 5 MG tablet Take 5 mg by mouth daily.    [provider]  nitroGLYCERIN (NITROSTAT) 0.4 MG SL tablet Place 0.4 mg under the tongue every 5 (five) minutes as needed for chest pain.    [provider]  tamsulosin (FLOMAX) 0.4 MG CAPS Take 0.4 mg by mouth daily.    [provider]  venlafaxine XR (EFFEXOR-XR) 150 MG 24 hr capsule Take 150 mg by mouth daily.    [provider]    Allergies    Patient has no known allergies.  Review of Systems   Review of Systems  Constitutional: Negative for chills and fever.  HENT: Negative for congestion.   Respiratory: Negative for shortness of breath.   Cardiovascular: Negative for chest pain.  Gastrointestinal: Negative for abdominal pain, diarrhea, nausea and vomiting.  Genitourinary: Negative for dysuria, enuresis, flank pain and frequency.  Complains of a leaking urinary catheter bag  Musculoskeletal: Negative for back pain.  Skin: Negative for rash.  Neurological: Negative for dizziness.  Hematological: Does not bruise/bleed easily.    Physical Exam Updated Vital Signs BP (!) 143/68 (BP Location: Left Arm)   Pulse 78   Temp 98.4 F (36.9 C) (Oral)   Resp 18   Ht 6' (1.829 m)   Wt 115 kg   SpO2 94%   BMI 34.38 kg/m   Physical Exam Vitals and nursing note reviewed. Exam conducted with a chaperone present.  Constitutional:      General: He is not in acute distress.    Appearance: He is not ill-appearing.  HENT:     Head: Normocephalic and atraumatic.     Nose: No congestion.      Mouth/Throat:     Mouth: Mucous membranes are moist.     Pharynx: Oropharynx is clear.  Eyes:     General: No scleral icterus. Cardiovascular:     Rate and Rhythm: Normal rate and regular rhythm.     Pulses: Normal pulses.     Heart sounds: No murmur heard.  No friction rub. No gallop.   Pulmonary:     Effort: No respiratory distress.     Breath sounds: No wheezing, rhonchi or rales.  Abdominal:     General: There is no distension.     Palpations: Abdomen is soft.     Tenderness: There is no abdominal tenderness. There is no right CVA tenderness, left CVA tenderness or guarding.  Genitourinary:    Penis: Normal.      Testes: Normal.     Comments: With chaperone present genitals were examined patient had urinary catheter in place, with no surrounding erythema, edema, discharge or drainage noted.  Testicles were palpated nontender to palpation. Musculoskeletal:        General: No swelling.  Skin:    General: Skin is warm and dry.     Findings: No rash.  Neurological:     Mental Status: He is alert.  Psychiatric:        Mood and Affect: Mood normal.     ED Results / Procedures / Treatments   Labs (all labs ordered are listed, but only abnormal results are displayed) Labs Reviewed - No data to display  EKG None  Radiology No results found.  Procedures Procedures (including critical care time)  Medications Ordered in ED Medications - No data to display  ED Course  I have reviewed the triage vital signs and the nursing notes.  Pertinent labs & imaging results that were available during my care of the patient were reviewed by me and considered in my medical decision making (see chart for details).    MDM Rules/Calculators/A&P                          Patient presents with leaking urinary catheter bag.  He is alert, does not appear acute distress, vital signs reassuring.  Due to well-appearing patient, benign physical exam exam further lab and imaging not  warranted.  Patient's urinary catheter was replaced without any difficulty.  I have low suspicion for UTI or pyelonephritis or kidney stones as patient denies urinary symptoms, denies abdominal pain, flank pain, negative CVA tenderness on exam.  Low suspicion form STI, epididymitis or prostatitis as patient denies testicular pain or penile discharge, denies saddle pain, on exam patient testicles were nontender to palpation.  Low suspicion for systemic  infection as patient is not nontoxic-appearing, vital signs reassuring, no obvious source infection on exam.  I suspect patient had a slight leak in his urinary catheter bag.  This was replaced and I recommend he follows up with his urologist for further evaluation.  Vital signs have remained stable, no indication for hospital admission.  Patient given at home care as well strict return precautions.  Patient verbalized that they understood agreed to said plan.  Final Clinical Impression(s) / ED Diagnoses Final diagnoses:  Encounter for replacement of urinary catheter    Rx / DC Orders ED Discharge Orders    None       Carroll Sage, PA-C 12/06/19 1647    Mancel Bale, MD 12/07/19 1621

## 2019-12-06 NOTE — Discharge Instructions (Signed)
Seen here for urinary catheter change.  Please continue your home medications as prescribed.  Recommend follow-up with your urologist for further evaluation management.  Come back to the emergency department if you develop chest pain, shortness of breath, severe abdominal pain, uncontrolled nausea, vomiting, diarrhea.

## 2019-12-16 ENCOUNTER — Emergency Department (HOSPITAL_COMMUNITY)
Admission: EM | Admit: 2019-12-16 | Discharge: 2019-12-16 | Disposition: A | Discharge status: Home / Self Care | Payer: No Typology Code available for payment source | Attending: Emergency Medicine | Admitting: Emergency Medicine

## 2019-12-16 ENCOUNTER — Encounter (HOSPITAL_COMMUNITY): Payer: Self-pay | Admitting: *Deleted

## 2019-12-16 ENCOUNTER — Other Ambulatory Visit: Payer: Self-pay

## 2019-12-16 DIAGNOSIS — I2510 Atherosclerotic heart disease of native coronary artery without angina pectoris: Secondary | ICD-10-CM | POA: Insufficient documentation

## 2019-12-16 DIAGNOSIS — Z7982 Long term (current) use of aspirin: Secondary | ICD-10-CM | POA: Insufficient documentation

## 2019-12-16 DIAGNOSIS — Z79899 Other long term (current) drug therapy: Secondary | ICD-10-CM | POA: Diagnosis not present

## 2019-12-16 DIAGNOSIS — T839XXA Unspecified complication of genitourinary prosthetic device, implant and graft, initial encounter: Secondary | ICD-10-CM

## 2019-12-16 DIAGNOSIS — T83098A Other mechanical complication of other indwelling urethral catheter, initial encounter: Secondary | ICD-10-CM | POA: Diagnosis present

## 2019-12-16 DIAGNOSIS — I10 Essential (primary) hypertension: Secondary | ICD-10-CM | POA: Insufficient documentation

## 2019-12-16 DIAGNOSIS — N4889 Other specified disorders of penis: Secondary | ICD-10-CM | POA: Insufficient documentation

## 2019-12-16 LAB — URINALYSIS, ROUTINE W REFLEX MICROSCOPIC
Bilirubin Urine: NEGATIVE
Glucose, UA: NEGATIVE mg/dL
Ketones, ur: NEGATIVE mg/dL
Nitrite: NEGATIVE
Protein, ur: 30 mg/dL — AB
Specific Gravity, Urine: 1.015 (ref 1.005–1.030)
WBC, UA: >50 WBC/hpf — ABNORMAL HIGH (ref 0–5)
pH: 5 (ref 5.0–8.0)

## 2019-12-16 NOTE — ED Notes (Signed)
This RN rounded on patient. On rounding, this RN notes that patient is having yellow urine output to drainage bag, no leaking surrounding catheter. Pt denies pain with urination at this time. Pt provided with a cup of water per request at this time.

## 2019-12-16 NOTE — ED Triage Notes (Signed)
Pt states pain where catheter is in place, states catheter is draining.  Pain ongoing for several hours.  Pt denies catheter being pulled on.  Denies any blood or clots.  Last emptied just PTA.

## 2019-12-16 NOTE — ED Triage Notes (Signed)
Denies any leaks, pain only when urinating per pt.

## 2019-12-16 NOTE — Discharge Instructions (Addendum)
You were seen today and your Foley catheter was exchanged.  Follow-up with your primary urologist.

## 2019-12-16 NOTE — ED Provider Notes (Signed)
[facility] EMERGENCY DEPARTMENT Provider Note   CSN: [id number] Arrival date & time: 12/16/19  1802     History Chief Complaint  Patient presents with  . foley catheter problem    Mario Massey is a 74 y.o. male.  HPI     This is a 74 year old male with a history of bipolar disorder, coronary artery disease, hypertension, chronic indwelling Foley catheter who presents with penile pain.  Patient reports that he had his Foley catheter replaced yesterday at the [facility]  He states that since that time he has had pain at his penis.  He describes pain "when I urinate."  When further questioning, he states that he feels pressure to urinate and then has pain.  He has noted continual drainage into the bag without clumps or blockage.  Has not noted any fevers or back pain.  No abdominal pain.  He has had a Foley catheter for approximately 10 months and has not had similar symptoms like this in the past.  Past Medical History:  Diagnosis Date  . Bipolar disorder (HCC)   . Coronary artery disease   . Depression   . Hypertension     Patient Active Problem List   Diagnosis Date Noted  . Stiffness of knee joint 01/17/2012  . Difficulty in walking(719.7) 01/17/2012    Past Surgical History:  Procedure Laterality Date  . KNEE SURGERY     with replacement on right knee       History reviewed. No pertinent family history.  Social History   Tobacco Use  . Smoking status: Never Smoker  . Smokeless tobacco: Never Used  Substance Use Topics  . Alcohol use: Yes    Comment: occasional  . Drug use: No    Home Medications Prior to Admission medications   Medication Sig Start Date End Date Taking? Authorizing Provider  aspirin 81 MG tablet Take 81 mg by mouth daily.    [provider]  cephALEXin (KEFLEX) 500 MG capsule Take 1 capsule (500 mg total) by mouth 4 (four) times daily. 10/19/19   Bethann Berkshire, MD  clotrimazole (LOTRIMIN) 1 % cream Apply to  affected area 2 times daily 05/30/19   Pollyann Savoy, MD  doxazosin (CARDURA) 8 MG tablet Take 8 mg by mouth at bedtime.    [provider]  DULoxetine (CYMBALTA) 20 MG capsule Take 20 mg by mouth daily.    [provider]  finasteride (PROSCAR) 5 MG tablet Take 5 mg by mouth daily.    [provider]  HYDROcodone-acetaminophen (NORCO/VICODIN) 5-325 MG tablet Take 1 tablet by mouth every 6 (six) hours as needed. 10/19/19   Bethann Berkshire, MD  lisinopril (PRINIVIL,ZESTRIL) 5 MG tablet Take 5 mg by mouth daily.    [provider]  nitroGLYCERIN (NITROSTAT) 0.4 MG SL tablet Place 0.4 mg under the tongue every 5 (five) minutes as needed for chest pain.    [provider]  tamsulosin (FLOMAX) 0.4 MG CAPS Take 0.4 mg by mouth daily.    [provider]  venlafaxine XR (EFFEXOR-XR) 150 MG 24 hr capsule Take 150 mg by mouth daily.    [provider]    Allergies    Patient has no known allergies.  Review of Systems   Review of Systems  Constitutional: Negative for fever.  Respiratory: Negative for shortness of breath.   Cardiovascular: Negative for chest pain.  Genitourinary: Positive for penile pain. Negative for decreased urine volume, flank pain, penile swelling and  scrotal swelling.  All other systems reviewed and are negative.   Physical Exam Updated Vital Signs BP 122/71   Pulse (!) 57   Temp 98.2 F (36.8 C) (Oral)   Resp 20   Ht 1.829 m (6')   Wt 117 kg   SpO2 96%   BMI 34.99 kg/m   Physical Exam Vitals and nursing note reviewed.  Constitutional:      Appearance: He is well-developed. He is obese. He is not ill-appearing.  HENT:     Head: Normocephalic and atraumatic.     Nose: Nose normal.     Mouth/Throat:     Mouth: Mucous membranes are moist.  Eyes:     Pupils: Pupils are equal, round, and reactive to light.  Cardiovascular:     Rate and Rhythm: Normal rate and regular rhythm.     Heart sounds: Normal  heart sounds. No murmur heard.   Pulmonary:     Effort: Pulmonary effort is normal. No respiratory distress.     Breath sounds: Normal breath sounds. No wheezing.  Abdominal:     Palpations: Abdomen is soft.     Tenderness: There is no abdominal tenderness. There is no rebound.  Genitourinary:    Comments: Foley catheter in place, foreskin retracted no obvious erythema, cuts, fissures at the urethral meatus, clear urine noted in Foley bag Musculoskeletal:     Cervical back: Neck supple.     Right lower leg: No edema.     Left lower leg: No edema.  Lymphadenopathy:     Cervical: No cervical adenopathy.  Skin:    General: Skin is warm and dry.  Neurological:     Mental Status: He is alert and oriented to person, place, and time.  Psychiatric:        Mood and Affect: Mood normal.     ED Results / Procedures / Treatments   Labs (all labs ordered are listed, but only abnormal results are displayed) Labs Reviewed  URINALYSIS, ROUTINE W REFLEX MICROSCOPIC - Abnormal; Notable for the following components:      Result Value   APPearance CLOUDY (*)    Hgb urine dipstick MODERATE (*)    Protein, ur 30 (*)    Leukocytes,Ua LARGE (*)    WBC, UA >50 (*)    Bacteria, UA FEW (*)    All other components within normal limits  URINE CULTURE    EKG None  Radiology No results found.  Procedures Procedures (including critical care time)  Medications Ordered in ED Medications - No data to display  ED Course  I have reviewed the triage vital signs and the nursing notes.  Pertinent labs & imaging results that were available during my care of the patient were reviewed by me and considered in my medical decision making (see chart for details).    MDM Rules/Calculators/A&P                          Patient presents with penile pain and a new Foley catheter which was changed yesterday.  Had never had pain like this before.  He is overall nontoxic vital signs are reassuring.  Denies  systemic symptoms, back pain, fevers.  Doubt infectious etiology including pyelonephritis or cystitis.  Doubt retention given that he is draining appropriately.  I do not see any lesions or irritation at the urethral meatus.  I have offered to replace the catheter given that his symptoms started when catheter was initially  changed.  He is agreeable to this plan.  Patient has not had any further symptoms with catheter placement.  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  We will culture but given that he is otherwise asymptomatic, will not treat.  After history, exam, and medical workup I feel the patient has been appropriately medically screened and is safe for discharge home. Pertinent diagnoses were discussed with the patient. Patient was given return precautions.  Final Clinical Impression(s) / ED Diagnoses Final diagnoses:  Problem with Foley catheter, initial encounter [facility])    Rx / DC Orders ED Discharge Orders    None       Shon Baton, MD 12/16/19 2129

## 2019-12-16 NOTE — ED Notes (Signed)
I assumed care of this patient at this time. At the time of assuming care, bed is locked in the lowest position, side rails x1, call bell within reach. ED provider at bedside for assessment. All questions and concerns voiced addressed at this time. Educated on hourly rounding and call light use, verbalized understanding at this time.

## 2019-12-18 LAB — URINE CULTURE

## 2020-01-03 ENCOUNTER — Emergency Department (HOSPITAL_COMMUNITY)
Admission: EM | Admit: 2020-01-03 | Discharge: 2020-01-03 | Disposition: A | Discharge status: Home / Self Care | Payer: No Typology Code available for payment source | Attending: Emergency Medicine | Admitting: Emergency Medicine

## 2020-01-03 ENCOUNTER — Encounter (HOSPITAL_COMMUNITY): Payer: Self-pay | Admitting: Emergency Medicine

## 2020-01-03 ENCOUNTER — Other Ambulatory Visit: Payer: Self-pay

## 2020-01-03 DIAGNOSIS — F329 Major depressive disorder, single episode, unspecified: Secondary | ICD-10-CM | POA: Diagnosis not present

## 2020-01-03 DIAGNOSIS — Y846 Urinary catheterization as the cause of abnormal reaction of the patient, or of later complication, without mention of misadventure at the time of the procedure: Secondary | ICD-10-CM | POA: Insufficient documentation

## 2020-01-03 DIAGNOSIS — I2510 Atherosclerotic heart disease of native coronary artery without angina pectoris: Secondary | ICD-10-CM | POA: Insufficient documentation

## 2020-01-03 DIAGNOSIS — T83091A Other mechanical complication of indwelling urethral catheter, initial encounter: Secondary | ICD-10-CM | POA: Diagnosis not present

## 2020-01-03 DIAGNOSIS — T839XXA Unspecified complication of genitourinary prosthetic device, implant and graft, initial encounter: Secondary | ICD-10-CM

## 2020-01-03 DIAGNOSIS — Z79899 Other long term (current) drug therapy: Secondary | ICD-10-CM | POA: Diagnosis not present

## 2020-01-03 DIAGNOSIS — I10 Essential (primary) hypertension: Secondary | ICD-10-CM | POA: Diagnosis not present

## 2020-01-03 DIAGNOSIS — Z96651 Presence of right artificial knee joint: Secondary | ICD-10-CM | POA: Insufficient documentation

## 2020-01-03 DIAGNOSIS — Z7982 Long term (current) use of aspirin: Secondary | ICD-10-CM | POA: Diagnosis not present

## 2020-01-03 DIAGNOSIS — R300 Dysuria: Secondary | ICD-10-CM | POA: Diagnosis present

## 2020-01-03 NOTE — ED Triage Notes (Signed)
Pt reports painful urination x 1 week. Pt has foley in place. Denies fevers.

## 2020-01-03 NOTE — ED Provider Notes (Signed)
[facility] EMERGENCY DEPARTMENT Provider Note   CSN: [id number] Arrival date & time: 01/03/20  0546     History Chief Complaint  Patient presents with  . Dysuria    Mario Massey is a 74 y.o. male.  Patient presents to ER chief complaint of burning sensation around the tip of his penis around his catheter site when he urinates.  He states this is been going on for about 2 weeks.  States he has been using a catheter for the past 3 months.  He also has some complaints of his his Foley catheter bag being twisted and being not of good "quality."  Patient records review multiple visits for similar problems in the past.  Patient otherwise denies any abdominal pain or flank pain.  Denies fevers or chills.  Denies vomiting or diarrhea.        Past Medical History:  Diagnosis Date  . Bipolar disorder (HCC)   . Coronary artery disease   . Depression   . Hypertension     Patient Active Problem List   Diagnosis Date Noted  . Stiffness of knee joint 01/17/2012  . Difficulty in walking(719.7) 01/17/2012    Past Surgical History:  Procedure Laterality Date  . KNEE SURGERY     with replacement on right knee       No family history on file.  Social History   Tobacco Use  . Smoking status: Never Smoker  . Smokeless tobacco: Never Used  Substance Use Topics  . Alcohol use: Yes    Comment: occasional  . Drug use: No    Home Medications Prior to Admission medications   Medication Sig Start Date End Date Taking? Authorizing Provider  aspirin 81 MG tablet Take 81 mg by mouth daily.    [provider]  cephALEXin (KEFLEX) 500 MG capsule Take 1 capsule (500 mg total) by mouth 4 (four) times daily. 10/19/19   Bethann Berkshire, MD  clotrimazole (LOTRIMIN) 1 % cream Apply to affected area 2 times daily 05/30/19   Pollyann Savoy, MD  doxazosin (CARDURA) 8 MG tablet Take 8 mg by mouth at bedtime.    [provider]  DULoxetine (CYMBALTA) 20 MG capsule  Take 20 mg by mouth daily.    [provider]  finasteride (PROSCAR) 5 MG tablet Take 5 mg by mouth daily.    [provider]  HYDROcodone-acetaminophen (NORCO/VICODIN) 5-325 MG tablet Take 1 tablet by mouth every 6 (six) hours as needed. 10/19/19   Bethann Berkshire, MD  lisinopril (PRINIVIL,ZESTRIL) 5 MG tablet Take 5 mg by mouth daily.    [provider]  nitroGLYCERIN (NITROSTAT) 0.4 MG SL tablet Place 0.4 mg under the tongue every 5 (five) minutes as needed for chest pain.    [provider]  tamsulosin (FLOMAX) 0.4 MG CAPS Take 0.4 mg by mouth daily.    [provider]  venlafaxine XR (EFFEXOR-XR) 150 MG 24 hr capsule Take 150 mg by mouth daily.    [provider]    Allergies    Patient has no known allergies.  Review of Systems   Review of Systems  Constitutional: Negative for fever.  HENT: Negative for ear pain and sore throat.   Eyes: Negative for pain.  Respiratory: Negative for cough.   Cardiovascular: Negative for chest pain.  Gastrointestinal: Negative for abdominal pain.  Genitourinary: Negative for flank pain.  Musculoskeletal: Negative for back pain.  Skin: Negative for color change and rash.  Neurological: Negative for  syncope.  All other systems reviewed and are negative.   Physical Exam Updated Vital Signs BP (!) 173/102 (BP Location: Right Arm)   Pulse 62   Temp 97.8 F (36.6 C) (Oral)   Resp 17   SpO2 98%   Physical Exam Constitutional:      General: He is not in acute distress.    Appearance: He is well-developed.  HENT:     Head: Normocephalic.     Mouth/Throat:     Mouth: Mucous membranes are moist.  Cardiovascular:     Rate and Rhythm: Normal rate.  Pulmonary:     Effort: Pulmonary effort is normal.  Abdominal:     Palpations: Abdomen is soft.     Tenderness: There is no right CVA tenderness or left CVA tenderness.  Musculoskeletal:     Right lower leg: No edema.     Left lower leg: No  edema.  Skin:    General: Skin is warm.     Capillary Refill: Capillary refill takes less than 2 seconds.  Neurological:     General: No focal deficit present.     Mental Status: He is alert.     ED Results / Procedures / Treatments   Labs (all labs ordered are listed, but only abnormal results are displayed) Labs Reviewed - No data to display  EKG None  Radiology No results found.  Procedures Procedures (including critical care time)  Medications Ordered in ED Medications - No data to display  ED Course  I have reviewed the triage vital signs and the nursing notes.  Pertinent labs & imaging results that were available during my care of the patient were reviewed by me and considered in my medical decision making (see chart for details).    MDM Rules/Calculators/A&P                          Patient has a benign abdominal exam.  Initial vital signs within normal limits as well.  Appears comfortable in no acute distress.  I doubt any urinary tract infection given duration of symptoms and lack of any systemic signs or fevers or chills.  I will advise outpatient follow-up with his urologist within the week.  Foley catheter bag was replaced.  Advised immediate return if he has any symptoms, fevers chills, abdominal pain back pain or any additional concerns.  Patient expressed understanding, discharged home in stable condition.  Final Clinical Impression(s) / ED Diagnoses Final diagnoses:  Complication of Foley catheter, initial encounter [facility])    Rx / DC Orders ED Discharge Orders    None       Cheryll Cockayne, MD 01/03/20 [id number]

## 2020-01-03 NOTE — ED Notes (Signed)
Leg bag exchanged per MD request.

## 2020-01-03 NOTE — Discharge Instructions (Addendum)
Call your primary care doctor or specialist as discussed in the next 2-3 days.   Return immediately back to the ER if:  Your symptoms worsen within the next 12-24 hours. You develop new symptoms such as new fevers, persistent vomiting, new pain, shortness of breath, or new weakness or numbness, or if you have any other concerns.  

## 2020-01-26 ENCOUNTER — Encounter (HOSPITAL_COMMUNITY): Payer: Self-pay | Admitting: *Deleted

## 2020-01-26 ENCOUNTER — Other Ambulatory Visit: Payer: Self-pay

## 2020-01-26 ENCOUNTER — Emergency Department (HOSPITAL_COMMUNITY)
Admission: EM | Admit: 2020-01-26 | Discharge: 2020-01-26 | Disposition: A | Discharge status: Home / Self Care | Payer: No Typology Code available for payment source | Attending: Emergency Medicine | Admitting: Emergency Medicine

## 2020-01-26 DIAGNOSIS — Z79899 Other long term (current) drug therapy: Secondary | ICD-10-CM | POA: Diagnosis not present

## 2020-01-26 DIAGNOSIS — Z7982 Long term (current) use of aspirin: Secondary | ICD-10-CM | POA: Diagnosis not present

## 2020-01-26 DIAGNOSIS — Z96651 Presence of right artificial knee joint: Secondary | ICD-10-CM | POA: Diagnosis not present

## 2020-01-26 DIAGNOSIS — I2510 Atherosclerotic heart disease of native coronary artery without angina pectoris: Secondary | ICD-10-CM | POA: Insufficient documentation

## 2020-01-26 DIAGNOSIS — T83098A Other mechanical complication of other indwelling urethral catheter, initial encounter: Secondary | ICD-10-CM | POA: Insufficient documentation

## 2020-01-26 DIAGNOSIS — T839XXA Unspecified complication of genitourinary prosthetic device, implant and graft, initial encounter: Secondary | ICD-10-CM

## 2020-01-26 DIAGNOSIS — I10 Essential (primary) hypertension: Secondary | ICD-10-CM | POA: Diagnosis not present

## 2020-01-26 NOTE — ED Triage Notes (Signed)
Pt states his catheter bag was leaking this morning. Pt denies any pain or other issues.

## 2020-01-26 NOTE — Discharge Instructions (Signed)
Return here or see your MD for any further problems or concerns.

## 2020-01-26 NOTE — ED Provider Notes (Signed)
[facility] EMERGENCY DEPARTMENT Provider Note   CSN: [id number] Arrival date & time: 01/26/20  1350     History Chief Complaint  Patient presents with  . catheter leaking    Mario Massey is a 74 y.o. male who is been wearing a chronic indwelling Foley for approximately 3 months presenting for a bag replacement as he states the connection between the tube and the bag is almost broken and has started to leak urine this morning.  His urologist is in [place] and he is scheduled to see them next month.  He denies dysuria, fevers or chills, there is no leakage around the penis.  He simply is desirous of a new leg bag.  HPI     Past Medical History:  Diagnosis Date  . Bipolar disorder (HCC)   . Coronary artery disease   . Depression   . Hypertension     Patient Active Problem List   Diagnosis Date Noted  . Stiffness of knee joint 01/17/2012  . Difficulty in walking(719.7) 01/17/2012    Past Surgical History:  Procedure Laterality Date  . KNEE SURGERY     with replacement on right knee       History reviewed. No pertinent family history.  Social History   Tobacco Use  . Smoking status: Never Smoker  . Smokeless tobacco: Never Used  Substance Use Topics  . Alcohol use: Yes    Comment: occasional  . Drug use: No    Home Medications Prior to Admission medications   Medication Sig Start Date End Date Taking? Authorizing Provider  aspirin 81 MG tablet Take 81 mg by mouth daily.    [provider]  cephALEXin (KEFLEX) 500 MG capsule Take 1 capsule (500 mg total) by mouth 4 (four) times daily. 10/19/19   Bethann Berkshire, MD  clotrimazole (LOTRIMIN) 1 % cream Apply to affected area 2 times daily 05/30/19   Pollyann Savoy, MD  doxazosin (CARDURA) 8 MG tablet Take 8 mg by mouth at bedtime.    [provider]  DULoxetine (CYMBALTA) 20 MG capsule Take 20 mg by mouth daily.    [provider]  finasteride (PROSCAR) 5 MG tablet  Take 5 mg by mouth daily.    [provider]  HYDROcodone-acetaminophen (NORCO/VICODIN) 5-325 MG tablet Take 1 tablet by mouth every 6 (six) hours as needed. 10/19/19   Bethann Berkshire, MD  lisinopril (PRINIVIL,ZESTRIL) 5 MG tablet Take 5 mg by mouth daily.    [provider]  nitroGLYCERIN (NITROSTAT) 0.4 MG SL tablet Place 0.4 mg under the tongue every 5 (five) minutes as needed for chest pain.    [provider]  tamsulosin (FLOMAX) 0.4 MG CAPS Take 0.4 mg by mouth daily.    [provider]  venlafaxine XR (EFFEXOR-XR) 150 MG 24 hr capsule Take 150 mg by mouth daily.    [provider]    Allergies    Patient has no known allergies.  Review of Systems   Review of Systems  Constitutional: Negative for chills and fever.  Respiratory: Negative.   Cardiovascular: Negative.   Gastrointestinal: Negative for abdominal pain, nausea and vomiting.  Genitourinary: Negative.  Negative for dysuria and hematuria.  All other systems reviewed and are negative.   Physical Exam Updated Vital Signs BP (!) 141/81 (BP Location: Right Arm)   Pulse 76   Temp 98.1 F (36.7 C) (Oral)   Resp 18   SpO2 100%   Physical Exam Vitals and nursing note  reviewed.  Constitutional:      Appearance: Normal appearance. He is well-developed and well-nourished.  HENT:     Head: Normocephalic and atraumatic.  Eyes:     Conjunctiva/sclera: Conjunctivae normal.  Cardiovascular:     Rate and Rhythm: Normal rate.     Pulses: Intact distal pulses.  Pulmonary:     Effort: Pulmonary effort is normal.     Breath sounds: No wheezing.  Abdominal:     General: Bowel sounds are normal.     Palpations: Abdomen is soft.     Tenderness: There is no abdominal tenderness.  Genitourinary:    Comments: Normal appearance of urine in urine bag.  Musculoskeletal:        General: Normal range of motion.  Skin:    General: Skin is warm and dry.  Neurological:     General: No focal  deficit present.     Mental Status: He is alert and oriented to person, place, and time.  Psychiatric:        Mood and Affect: Mood and affect normal.     ED Results / Procedures / Treatments   Labs (all labs ordered are listed, but only abnormal results are displayed) Labs Reviewed - No data to display  EKG None  Radiology No results found.  Procedures Procedures (including critical care time)  Medications Ordered in ED Medications - No data to display  ED Course  I have reviewed the triage vital signs and the nursing notes.  Pertinent labs & imaging results that were available during my care of the patient were reviewed by me and considered in my medical decision making (see chart for details).    MDM Rules/Calculators/A&P                          Foley bag change secondary to leakage. No indication for full Foley change.  Pt has his foley changed monthly by his urologist.   Final Clinical Impression(s) / ED Diagnoses Final diagnoses:  Complication of Foley catheter, initial encounter [facility])    Rx / DC Orders ED Discharge Orders    None       Victoriano Lain 01/26/20 Morton Amy, MD 01/27/20 1610

## 2020-02-24 ENCOUNTER — Emergency Department (HOSPITAL_COMMUNITY): Payer: No Typology Code available for payment source

## 2020-02-24 ENCOUNTER — Other Ambulatory Visit: Payer: Self-pay

## 2020-02-24 ENCOUNTER — Encounter (HOSPITAL_COMMUNITY): Payer: Self-pay | Admitting: Emergency Medicine

## 2020-02-24 ENCOUNTER — Emergency Department (HOSPITAL_COMMUNITY)
Admission: EM | Admit: 2020-02-24 | Discharge: 2020-02-24 | Disposition: A | Discharge status: Home / Self Care | Payer: No Typology Code available for payment source | Attending: Emergency Medicine | Admitting: Emergency Medicine

## 2020-02-24 DIAGNOSIS — S0083XA Contusion of other part of head, initial encounter: Secondary | ICD-10-CM | POA: Insufficient documentation

## 2020-02-24 DIAGNOSIS — M25532 Pain in left wrist: Secondary | ICD-10-CM | POA: Insufficient documentation

## 2020-02-24 DIAGNOSIS — Z79899 Other long term (current) drug therapy: Secondary | ICD-10-CM | POA: Insufficient documentation

## 2020-02-24 DIAGNOSIS — Z7982 Long term (current) use of aspirin: Secondary | ICD-10-CM | POA: Diagnosis not present

## 2020-02-24 DIAGNOSIS — I10 Essential (primary) hypertension: Secondary | ICD-10-CM | POA: Insufficient documentation

## 2020-02-24 DIAGNOSIS — I2510 Atherosclerotic heart disease of native coronary artery without angina pectoris: Secondary | ICD-10-CM | POA: Insufficient documentation

## 2020-02-24 DIAGNOSIS — M25572 Pain in left ankle and joints of left foot: Secondary | ICD-10-CM | POA: Insufficient documentation

## 2020-02-24 DIAGNOSIS — T148XXA Other injury of unspecified body region, initial encounter: Secondary | ICD-10-CM

## 2020-02-24 DIAGNOSIS — M79641 Pain in right hand: Secondary | ICD-10-CM | POA: Diagnosis not present

## 2020-02-24 DIAGNOSIS — W01198A Fall on same level from slipping, tripping and stumbling with subsequent striking against other object, initial encounter: Secondary | ICD-10-CM | POA: Diagnosis not present

## 2020-02-24 DIAGNOSIS — Z23 Encounter for immunization: Secondary | ICD-10-CM | POA: Insufficient documentation

## 2020-02-24 DIAGNOSIS — S0990XA Unspecified injury of head, initial encounter: Secondary | ICD-10-CM | POA: Diagnosis present

## 2020-02-24 MED ORDER — TETANUS-DIPHTH-ACELL PERTUSSIS 5-2.5-18.5 LF-MCG/0.5 IM SUSY
0.5000 mL | PREFILLED_SYRINGE | Freq: Once | INTRAMUSCULAR | Status: AC
  Administered 2020-02-24: 0.5 mL via INTRAMUSCULAR
  Filled 2020-02-24: qty 0.5

## 2020-02-24 NOTE — ED Provider Notes (Signed)
[facility] EMERGENCY DEPARTMENT Provider Note   CSN: [id number] Arrival date & time: 02/24/20  0540     History Chief Complaint  Patient presents with   Mario Massey is a 75 y.o. male.  Patient presents to the emergency department for evaluation of injuries from a fall.  Patient reports that he lost his balance and fell, hitting the right side of his face on the ground.  He is complaining of right hand, left wrist and left ankle pain from the fall.  No loss of consciousness.        Past Medical History:  Diagnosis Date   Bipolar disorder (HCC)    Coronary artery disease    Depression    Hypertension     Patient Active Problem List   Diagnosis Date Noted   Stiffness of knee joint 01/17/2012   Difficulty in walking(719.7) 01/17/2012    Past Surgical History:  Procedure Laterality Date   KNEE SURGERY     with replacement on right knee       History reviewed. No pertinent family history.  Social History   Tobacco Use   Smoking status: Never Smoker   Smokeless tobacco: Never Used  Substance Use Topics   Alcohol use: Yes    Comment: occasional   Drug use: No    Home Medications Prior to Admission medications   Medication Sig Start Date End Date Taking? Authorizing Provider  aspirin 81 MG tablet Take 81 mg by mouth daily.    [provider]  cephALEXin (KEFLEX) 500 MG capsule Take 1 capsule (500 mg total) by mouth 4 (four) times daily. 10/19/19   Bethann Berkshire, MD  clotrimazole (LOTRIMIN) 1 % cream Apply to affected area 2 times daily 05/30/19   Pollyann Savoy, MD  doxazosin (CARDURA) 8 MG tablet Take 8 mg by mouth at bedtime.    [provider]  DULoxetine (CYMBALTA) 20 MG capsule Take 20 mg by mouth daily.    [provider]  finasteride (PROSCAR) 5 MG tablet Take 5 mg by mouth daily.    [provider]  HYDROcodone-acetaminophen (NORCO/VICODIN) 5-325 MG tablet Take 1 tablet by mouth  every 6 (six) hours as needed. 10/19/19   Bethann Berkshire, MD  lisinopril (PRINIVIL,ZESTRIL) 5 MG tablet Take 5 mg by mouth daily.    [provider]  nitroGLYCERIN (NITROSTAT) 0.4 MG SL tablet Place 0.4 mg under the tongue every 5 (five) minutes as needed for chest pain.    [provider]  tamsulosin (FLOMAX) 0.4 MG CAPS Take 0.4 mg by mouth daily.    [provider]  venlafaxine XR (EFFEXOR-XR) 150 MG 24 hr capsule Take 150 mg by mouth daily.    [provider]    Allergies    Patient has no known allergies.  Review of Systems   Review of Systems  HENT: Positive for facial swelling.   Musculoskeletal: Positive for arthralgias.  Skin: Positive for wound.  All other systems reviewed and are negative.   Physical Exam Updated Vital Signs BP (!) 148/47 (BP Location: Right Arm)    Pulse 62    Temp 97.9 F (36.6 C) (Oral)    Resp 19    Ht 6' (1.829 m)    Wt 118.8 kg    SpO2 96%    BMI 35.53 kg/m   Physical Exam Vitals and nursing note reviewed.  Constitutional:      General: He is not in acute distress.  Appearance: Normal appearance. He is well-developed and well-nourished.  HENT:     Head: Normocephalic. Abrasion (right face) present.     Right Ear: Hearing normal.     Left Ear: Hearing normal.     Nose: Nose normal.     Mouth/Throat:     Mouth: Oropharynx is clear and moist and mucous membranes are normal.  Eyes:     Extraocular Movements: EOM normal.     Conjunctiva/sclera: Conjunctivae normal.     Pupils: Pupils are equal, round, and reactive to light.  Cardiovascular:     Rate and Rhythm: Regular rhythm.     Heart sounds: S1 normal and S2 normal. No murmur heard. No friction rub. No gallop.   Pulmonary:     Effort: Pulmonary effort is normal. No respiratory distress.     Breath sounds: Normal breath sounds.  Chest:     Chest wall: No tenderness.  Abdominal:     General: Bowel sounds are normal.     Palpations: Abdomen is soft.  There is no hepatosplenomegaly.     Tenderness: There is no abdominal tenderness. There is no guarding or rebound. Negative signs include Murphy's sign and McBurney's sign.     Hernia: No hernia is present.  Musculoskeletal:        General: Normal range of motion.     Left wrist: Tenderness present. No swelling, deformity or lacerations. Normal range of motion.     Right hand: Tenderness present. No swelling or deformity. Normal range of motion.     Cervical back: Normal range of motion and neck supple.     Left ankle: Tenderness present. Normal range of motion.       Legs:  Skin:    General: Skin is warm, dry and intact.     Findings: No rash.     Nails: There is no cyanosis.  Neurological:     Mental Status: He is alert and oriented to person, place, and time.     GCS: GCS eye subscore is 4. GCS verbal subscore is 5. GCS motor subscore is 6.     Cranial Nerves: No cranial nerve deficit.     Sensory: No sensory deficit.     Coordination: Coordination normal.     Deep Tendon Reflexes: Strength normal.  Psychiatric:        Mood and Affect: Mood and affect normal.        Speech: Speech normal.        Behavior: Behavior normal.        Thought Content: Thought content normal.     ED Results / Procedures / Treatments   Labs (all labs ordered are listed, but only abnormal results are displayed) Labs Reviewed - No data to display  EKG None  Radiology DG Wrist Complete Left  Result Date: 02/24/2020 CLINICAL DATA:  Fall, left wrist pain EXAM: LEFT WRIST - COMPLETE 3+ VIEW COMPARISON:  None. FINDINGS: There is no evidence of fracture or dislocation. There is no evidence of arthropathy or other focal bone abnormality. Soft tissues are unremarkable. IMPRESSION: Negative. Electronically Signed   By: Helyn Numbers MD   On: 02/24/2020 06:44   DG Ankle Complete Left  Result Date: 02/24/2020 CLINICAL DATA:  Fall, left ankle pain EXAM: LEFT ANKLE COMPLETE - 3+ VIEW COMPARISON:  None.  FINDINGS: Three view radiograph left ankle demonstrates normal alignment. No fracture or dislocation. Ankle mortise is intact. No ankle effusion. Small plantar calcaneal spur. Vascular calcifications are noted. Soft tissues  are otherwise unremarkable. IMPRESSION: No acute fracture or dislocation. Electronically Signed   By: Helyn Numbers MD   On: 02/24/2020 06:48   CT HEAD WO CONTRAST  Result Date: 02/24/2020 CLINICAL DATA:  75 year old male status post fall yesterday with abrasions and pain. EXAM: CT HEAD WITHOUT CONTRAST TECHNIQUE: Contiguous axial images were obtained from the base of the skull through the vertex without intravenous contrast. COMPARISON:  Head and cervical spine CT 02/20/2017. FINDINGS: Brain: Mild cerebral volume loss since 2019. No midline shift, ventriculomegaly, mass effect, evidence of mass lesion, intracranial hemorrhage or evidence of cortically based acute infarction. Gray-white matter differentiation appears stable and within normal limits for age throughout the brain. Vascular: Mild Calcified atherosclerosis at the skull base. No suspicious intracranial vascular hyperdensity. Skull: Stable.  No skull fracture identified. Sinuses/Orbits: Visualized paranasal sinuses and mastoids are stable and well pneumatized. Other: Stable small round retained metallic foreign body along the bridge of the left nose. No acute orbit or scalp soft tissue injury identified. IMPRESSION: 1. No acute traumatic injury identified. 2. Stable and negative for age non contrast CT appearance of the brain. 3. Small chronic retained metallic foreign body along the bridge of the left nose. Electronically Signed   By: Odessa Fleming M.D.   On: 02/24/2020 06:27   DG Hand Complete Right  Result Date: 02/24/2020 CLINICAL DATA:  Fall, right hand pain EXAM: RIGHT HAND - COMPLETE 3+ VIEW COMPARISON:  None. FINDINGS: Three view radiograph right hand demonstrates normal alignment. No fracture or dislocation. Mild  degenerative arthritis involving the first, second and third metacarpophalangeal joints. Soft tissues are unremarkable. IMPRESSION: No fracture or dislocation Electronically Signed   By: Helyn Numbers MD   On: 02/24/2020 06:46   CT MAXILLOFACIAL WO CONTRAST  Result Date: 02/24/2020 CLINICAL DATA:  75 year old male status post fall yesterday with abrasions and pain. EXAM: CT MAXILLOFACIAL WITHOUT CONTRAST TECHNIQUE: Multidetector CT imaging of the maxillofacial structures was performed. Multiplanar CT image reconstructions were also generated. COMPARISON:  Head and cervical spine CT 02/20/2017. Head CT today reported separately. FINDINGS: Osseous: Mandible intact and normally located. Large inflammatory periapical lucency about the right mandible molar (series 3, image 62). Similar right greater than left maxillary molar periapical lucency. No active soft tissue inflammation. No maxilla fracture. Zygoma and pterygoid appear intact. Mild chronic nasal bone fractures appear stable. Central skull base and visible calvarium intact. Visible cervical vertebrae stable and intact. Orbits: Intact orbital walls. Orbits soft tissues appears stable and negative. Sinuses: Mild right maxillary sinus alveolar recess mucosal thickening contiguous with the maxillary molar findings. Other paranasal sinuses and mastoids are clear. Tympanic cavities and mastoids are clear. Soft tissues: Negative visible noncontrast larynx, pharynx, parapharyngeal spaces, retropharyngeal space, sublingual space. Submandibular glands, parotid glands, masticator spaces are within normal limits. No upper cervical lymphadenopathy. There is mild asymmetric superficial right face soft tissue swelling and stranding, premalar inferior and lateral to the right orbit (series 2, image 28). No soft tissue gas. Limited intracranial: Negative. IMPRESSION: 1. Right face superficial soft tissue swelling/contusion. 2. No underlying facial fracture or other acute  traumatic injury identified. 3. Bilateral molar dental periapical lucency, mild associated maxillary sinus alveolar recess mucosal thickening. Electronically Signed   By: Odessa Fleming M.D.   On: 02/24/2020 06:32    Procedures Procedures   Medications Ordered in ED Medications  Tdap (BOOSTRIX) injection 0.5 mL (0.5 mLs Intramuscular Given 02/24/20 0725)    ED Course  I have reviewed the triage vital signs and the  nursing notes.  Pertinent labs & imaging results that were available during my care of the patient were reviewed by me and considered in my medical decision making (see chart for details).    MDM Rules/Calculators/A&P                          Patient presents to the emergency department for evaluation after a fall.  Patient with abrasion to the right cheek and left ankle.  He is complaining of ankle pain, hand pain, wrist pain.  X-rays negative.  Patient also underwent CT head and maxillofacial bones without any traumatic injuries being found.  Tetanus was up dated.  Final Clinical Impression(s) / ED Diagnoses Final diagnoses:  Contusion of face, initial encounter  Abrasion    Rx / DC Orders ED Discharge Orders    None       Namiko Pritts, Canary Brim, MD 02/25/20 [phone]

## 2020-02-24 NOTE — ED Triage Notes (Signed)
Pt here due to a trip and fall yesterday. Pt has abrasion and bruising to right side of face, bilateral hands/wrist, and left ankle.

## 2020-02-24 NOTE — ED Notes (Signed)
Pt BIB reports he had a fall last night, abrasions noted to face, bilateral hands/wrists, and L foot. +ETOH. EDP at bedside

## 2020-02-28 ENCOUNTER — Emergency Department (HOSPITAL_COMMUNITY)
Admission: EM | Admit: 2020-02-28 | Discharge: 2020-02-29 | Disposition: A | Discharge status: Home / Self Care | Payer: No Typology Code available for payment source | Attending: Emergency Medicine | Admitting: Emergency Medicine

## 2020-02-28 ENCOUNTER — Other Ambulatory Visit: Payer: Self-pay

## 2020-02-28 DIAGNOSIS — I2510 Atherosclerotic heart disease of native coronary artery without angina pectoris: Secondary | ICD-10-CM | POA: Insufficient documentation

## 2020-02-28 DIAGNOSIS — Z7982 Long term (current) use of aspirin: Secondary | ICD-10-CM | POA: Insufficient documentation

## 2020-02-28 DIAGNOSIS — W1830XA Fall on same level, unspecified, initial encounter: Secondary | ICD-10-CM | POA: Insufficient documentation

## 2020-02-28 DIAGNOSIS — N3001 Acute cystitis with hematuria: Secondary | ICD-10-CM | POA: Diagnosis not present

## 2020-02-28 DIAGNOSIS — S80812A Abrasion, left lower leg, initial encounter: Secondary | ICD-10-CM

## 2020-02-28 DIAGNOSIS — I10 Essential (primary) hypertension: Secondary | ICD-10-CM | POA: Diagnosis not present

## 2020-02-28 DIAGNOSIS — N390 Urinary tract infection, site not specified: Secondary | ICD-10-CM

## 2020-02-28 DIAGNOSIS — S80811A Abrasion, right lower leg, initial encounter: Secondary | ICD-10-CM

## 2020-02-28 DIAGNOSIS — S0081XA Abrasion of other part of head, initial encounter: Secondary | ICD-10-CM | POA: Insufficient documentation

## 2020-02-28 DIAGNOSIS — S8992XA Unspecified injury of left lower leg, initial encounter: Secondary | ICD-10-CM | POA: Diagnosis present

## 2020-02-28 DIAGNOSIS — Z79899 Other long term (current) drug therapy: Secondary | ICD-10-CM | POA: Insufficient documentation

## 2020-02-28 DIAGNOSIS — R319 Hematuria, unspecified: Secondary | ICD-10-CM

## 2020-02-28 NOTE — ED Triage Notes (Signed)
Pt states he was at [facility] and was cathed today. States he has to be cathed for urine problems. But ever since earlier he states he has had "tremendous pain, about a 7 or 8. Something isnt right and I need it checked out."

## 2020-02-29 LAB — URINALYSIS, ROUTINE W REFLEX MICROSCOPIC
Bilirubin Urine: NEGATIVE
Glucose, UA: NEGATIVE mg/dL
Ketones, ur: NEGATIVE mg/dL
Nitrite: POSITIVE — AB
Protein, ur: 100 mg/dL — AB
Specific Gravity, Urine: 1.015 (ref 1.005–1.030)
WBC, UA: >50 WBC/hpf — ABNORMAL HIGH (ref 0–5)
pH: 5 (ref 5.0–8.0)

## 2020-02-29 MED ORDER — CEPHALEXIN 500 MG PO CAPS: 500.0000 mg | ORAL_CAPSULE | Freq: Three times a day (TID) | ORAL | 0 refills | Status: DC

## 2020-02-29 MED ORDER — CEPHALEXIN 500 MG PO CAPS
500.0000 mg | ORAL_CAPSULE | Freq: Once | ORAL | Status: AC
  Administered 2020-02-29: 500 mg via ORAL
  Filled 2020-02-29: qty 1

## 2020-02-29 NOTE — ED Notes (Signed)
Reviewed discharge papers and all questions answered. Pt ambulatory to waiting room with steady gate. Pt unable to sign for discharge due to signature pad not working.

## 2020-02-29 NOTE — ED Provider Notes (Signed)
[facility] EMERGENCY DEPARTMENT Provider Note   CSN: [id number] Arrival date & time: 02/28/20  2120   Time seen 1:15 AM  History Chief Complaint  Patient presents with  . Dysuria    Mario Massey is a 75 y.o. male.  HPI   Patient has a history of indwelling Foley catheter.  He was at the [place] today and they changed it out.  Since then he has had some pain when he urinates.  He also points out to me he has some abrasions on his lower legs and his face from a fall last week.  He is concerned that they are not healing well.  He has been putting an unknown ointment on it.  PCP [facility], [facility]   Past Medical History:  Diagnosis Date  . Bipolar disorder (HCC)   . Coronary artery disease   . Depression   . Hypertension     Patient Active Problem List   Diagnosis Date Noted  . Stiffness of knee joint 01/17/2012  . Difficulty in walking(719.7) 01/17/2012    Past Surgical History:  Procedure Laterality Date  . KNEE SURGERY     with replacement on right knee       No family history on file.  Social History   Tobacco Use  . Smoking status: Never Smoker  . Smokeless tobacco: Never Used  Substance Use Topics  . Alcohol use: Yes    Comment: occasional  . Drug use: No    Home Medications Prior to Admission medications   Medication Sig Start Date End Date Taking? Authorizing Provider  cephALEXin (KEFLEX) 500 MG capsule Take 1 capsule (500 mg total) by mouth 3 (three) times daily. 02/29/20  Yes Devoria Albe, MD  aspirin 81 MG tablet Take 81 mg by mouth daily.    [provider]  clotrimazole (LOTRIMIN) 1 % cream Apply to affected area 2 times daily 05/30/19   Pollyann Savoy, MD  doxazosin (CARDURA) 8 MG tablet Take 8 mg by mouth at bedtime.    [provider]  DULoxetine (CYMBALTA) 20 MG capsule Take 20 mg by mouth daily.    [provider]  finasteride (PROSCAR) 5 MG tablet Take 5 mg by mouth daily.    [provider]   HYDROcodone-acetaminophen (NORCO/VICODIN) 5-325 MG tablet Take 1 tablet by mouth every 6 (six) hours as needed. 10/19/19   Bethann Berkshire, MD  lisinopril (PRINIVIL,ZESTRIL) 5 MG tablet Take 5 mg by mouth daily.    [provider]  nitroGLYCERIN (NITROSTAT) 0.4 MG SL tablet Place 0.4 mg under the tongue every 5 (five) minutes as needed for chest pain.    [provider]  tamsulosin (FLOMAX) 0.4 MG CAPS Take 0.4 mg by mouth daily.    [provider]  venlafaxine XR (EFFEXOR-XR) 150 MG 24 hr capsule Take 150 mg by mouth daily.    [provider]    Allergies    Patient has no known allergies.  Review of Systems   Review of Systems  All other systems reviewed and are negative.   Physical Exam Updated Vital Signs BP (!) 141/61 (BP Location: Right Arm)   Pulse 88   Temp 97.8 F (36.6 C) (Oral)   Resp 17   Ht 6' (1.829 m)   Wt 118.8 kg   SpO2 99%   BMI 35.53 kg/m   Physical Exam Vitals and nursing note reviewed.  Constitutional:      Appearance: Normal appearance. He is obese.  HENT:     Head: Normocephalic.     Comments: Patient has a small abrasion on his right cheek Eyes:     Extraocular Movements: Extraocular movements intact.     Conjunctiva/sclera: Conjunctivae normal.     Pupils: Pupils are equal, round, and reactive to light.  Cardiovascular:     Rate and Rhythm: Normal rate.  Pulmonary:     Effort: Pulmonary effort is normal.  Genitourinary:    Comments: Patient has a leg bag in place. Musculoskeletal:     Cervical back: Normal range of motion.  Skin:    General: Skin is warm and dry.     Comments: Patient has some dried up scabbed areas on the anterior surface of his left ankle lower leg.  He has some scattered scabbed areas on his right lower leg that are smaller and spread apart.  There is a small area of redness around the scab portions.  Neurological:     General: No focal deficit present.     Mental Status: He is alert  and oriented to person, place, and time.     Cranial Nerves: No cranial nerve deficit.  Psychiatric:        Mood and Affect: Mood normal.        Behavior: Behavior normal.        Thought Content: Thought content normal.      Face  Right lower leg   Left lower leg     ED Results / Procedures / Treatments   Labs (all labs ordered are listed, but only abnormal results are displayed) Results for orders placed or performed during the hospital encounter of 02/28/20  Urinalysis, Routine w reflex microscopic Urine, Clean Catch  Result Value Ref Range   Color, Urine YELLOW YELLOW   APPearance HAZY (A) CLEAR   Specific Gravity, Urine 1.015 1.005 - 1.030   pH 5.0 5.0 - 8.0   Glucose, UA NEGATIVE NEGATIVE mg/dL   Hgb urine dipstick MODERATE (A) NEGATIVE   Bilirubin Urine NEGATIVE NEGATIVE   Ketones, ur NEGATIVE NEGATIVE mg/dL   Protein, ur 102 (A) NEGATIVE mg/dL   Nitrite POSITIVE (A) NEGATIVE   Leukocytes,Ua MODERATE (A) NEGATIVE   RBC / HPF 21-50 0 - 5 RBC/hpf   WBC, UA >50 (H) 0 - 5 WBC/hpf   Bacteria, UA RARE (A) NONE SEEN   Mucus PRESENT     Laboratory interpretation all normal except probable UTI with positive nitrates   EKG None  Radiology No results found.  Procedures Procedures   Urine culture December 16, 2019 multiple species Urine culture October 19, 2019 multiple species Urine culture April 28, 1998 21/100,000 Enterobacter cloacae. Component 10 mo ago  Specimen Description URINE, CLEAN CATCH  Performed at [facility], [address], [place], [place] [address]   Special Requests NONE  Performed at [facility], [address], [place], [place] [address]   Culture >=100,000 COLONIES/mL ENTEROBACTER CLOACAEAbnormal   Report Status 05/01/2019 FINAL   Organism ID, Bacteria ENTEROBACTER CLOACAEAbnormal   Resulting Agency [facility]      Susceptibility   Enterobacter cloacae    MIC    CEFAZOLIN >=64 RESIST... Resistant    CIPROFLOXACIN  <=0.25 SENS... Sensitive    GENTAMICIN <=1 SENSITIVE  Sensitive    IMIPENEM 1 SENSITIVE  Sensitive    NITROFURANTOIN <=16 SENSIT... Sensitive    PIP/TAZO 8 SENSITIVE  Sensitive    TRIMETH/SULFA <=20 SENSIT... Sensitive  Medications Ordered in ED Medications  cephALEXin (KEFLEX) capsule 500 mg (500 mg Oral Given 02/29/20 0150)    ED Course  I have reviewed the triage vital signs and the nursing notes.  Pertinent labs & imaging results that were available during my care of the patient were reviewed by me and considered in my medical decision making (see chart for details).    MDM Rules/Calculators/A&P                          Bladder scan was 0.  Nurses noted that his leg bag was below his knee and that was placing tension on his catheter.  They readjusted it so that the catheter had some slack in it.  That seems to have helped with his discomfort.  Urinalysis is suggestive of UTI.  Urine culture was done.  When I review his prior urine cultures he did have a positive culture and March last year with Enterobacter cloacae.  It was sensitive to cephalosporins.  He was started on Keflex.  This may also help with the mild redness around his scabbed areas.   Final Clinical Impression(s) / ED Diagnoses Final diagnoses:  Urinary tract infection with hematuria, site unspecified  Abrasion of anterior left lower leg, initial encounter  Abrasion of anterior right lower leg, initial encounter  Abrasion, face w/o infection    Rx / DC Orders ED Discharge Orders         Ordered    cephALEXin (KEFLEX) 500 MG capsule  3 times daily        02/29/20 0153         Plan discharge  Devoria Albe, MD, Concha Pyo, MD 02/29/20 [phone]

## 2020-02-29 NOTE — Discharge Instructions (Addendum)
Keep the abrasions clean and dry.  Do use triple antibiotic ointment on the wounds.  Take the antibiotic as prescribed for your urinary tract infection and it should also help your wounds heal better.  Recheck if you get fever, nausea, vomiting, abdominal pain, or your wounds look like they are getting worse.

## 2020-03-05 LAB — URINE CULTURE: Culture: >=100000 — AB

## 2020-03-06 NOTE — Progress Notes (Signed)
ED Antimicrobial Stewardship Positive Culture Follow Up   Mario Massey is an 75 y.o. male who presented to [facility] on 02/28/2020 with a chief complaint of dysuria, with history of indwelling catheter. Catheter replaced and pain with urination still present.   Chief Complaint  Patient presents with  . Dysuria    Recent Results (from the past 720 hour(s))  Urine culture     Status: Abnormal   Collection Time: 02/29/20  1:42 AM   Specimen: Urine, Clean Catch  Result Value Ref Range Status   Specimen Description   Final    URINE, CLEAN CATCH Performed at [facility], [address], [place], [place] [address]    Special Requests   Final    NONE Performed at [facility], [address], [place], [place] [address]    Culture >=100,000 COLONIES/mL ENTEROBACTER CLOACAE (A)  Final   Report Status 03/05/2020 FINAL  Final   Organism ID, Bacteria ENTEROBACTER CLOACAE (A)  Final      Susceptibility   Enterobacter cloacae - MIC*    CEFAZOLIN RESISTANT Resistant     CEFEPIME <=0.12 SENSITIVE Sensitive     CIPROFLOXACIN <=0.25 SENSITIVE Sensitive     GENTAMICIN <=1 SENSITIVE Sensitive     IMIPENEM <=0.25 SENSITIVE Sensitive     NITROFURANTOIN 128 RESISTANT Resistant     TRIMETH/SULFA <=20 SENSITIVE Sensitive     PIP/TAZO <=4 SENSITIVE Sensitive     * >=100,000 COLONIES/mL ENTEROBACTER CLOACAE    [x]  Treated with cephalexin, organism resistant to prescribed antimicrobial  New antibiotic prescription: STOP cephalexin; START Bactrim DS 1 tab PO BID for 5 days   ED Provider: , PA-C    Loleta Dicker, PharmD-Candidate  03/06/2020, 9:54 AM Monday - Friday phone -  [phone] Saturday - Sunday phone - [phone]

## 2020-03-28 ENCOUNTER — Emergency Department (HOSPITAL_COMMUNITY)
Admission: EM | Admit: 2020-03-28 | Discharge: 2020-03-28 | Disposition: A | Discharge status: Home / Self Care | Payer: No Typology Code available for payment source | Attending: Emergency Medicine | Admitting: Emergency Medicine

## 2020-03-28 ENCOUNTER — Other Ambulatory Visit: Payer: Self-pay

## 2020-03-28 ENCOUNTER — Encounter (HOSPITAL_COMMUNITY): Payer: Self-pay | Admitting: Emergency Medicine

## 2020-03-28 DIAGNOSIS — T83031A Leakage of indwelling urethral catheter, initial encounter: Secondary | ICD-10-CM | POA: Diagnosis not present

## 2020-03-28 DIAGNOSIS — Z7982 Long term (current) use of aspirin: Secondary | ICD-10-CM | POA: Insufficient documentation

## 2020-03-28 DIAGNOSIS — R300 Dysuria: Secondary | ICD-10-CM | POA: Diagnosis present

## 2020-03-28 DIAGNOSIS — I10 Essential (primary) hypertension: Secondary | ICD-10-CM | POA: Diagnosis not present

## 2020-03-28 DIAGNOSIS — R339 Retention of urine, unspecified: Secondary | ICD-10-CM | POA: Diagnosis not present

## 2020-03-28 DIAGNOSIS — Z79899 Other long term (current) drug therapy: Secondary | ICD-10-CM | POA: Insufficient documentation

## 2020-03-28 DIAGNOSIS — Z96651 Presence of right artificial knee joint: Secondary | ICD-10-CM | POA: Diagnosis not present

## 2020-03-28 DIAGNOSIS — Y846 Urinary catheterization as the cause of abnormal reaction of the patient, or of later complication, without mention of misadventure at the time of the procedure: Secondary | ICD-10-CM | POA: Diagnosis not present

## 2020-03-28 DIAGNOSIS — I2510 Atherosclerotic heart disease of native coronary artery without angina pectoris: Secondary | ICD-10-CM | POA: Insufficient documentation

## 2020-03-28 DIAGNOSIS — T839XXA Unspecified complication of genitourinary prosthetic device, implant and graft, initial encounter: Secondary | ICD-10-CM

## 2020-03-28 NOTE — ED Triage Notes (Signed)
Pt c/o inability to urinate and pain when trying to. Pt was seen here earlier for a new leg bag

## 2020-03-28 NOTE — ED Notes (Signed)
Leg Foley bag changed.

## 2020-03-28 NOTE — Discharge Instructions (Addendum)
Your Foley leg bag has been replaced.  Follow-up with your primary doctor if needed

## 2020-03-28 NOTE — ED Triage Notes (Signed)
Pt states his leg bag for his catheter busted today, states he is just here for a new one

## 2020-03-29 ENCOUNTER — Emergency Department (HOSPITAL_COMMUNITY)
Admission: EM | Admit: 2020-03-29 | Discharge: 2020-03-29 | Disposition: A | Discharge status: Home / Self Care | Payer: No Typology Code available for payment source | Attending: Emergency Medicine | Admitting: Emergency Medicine

## 2020-03-29 DIAGNOSIS — R339 Retention of urine, unspecified: Secondary | ICD-10-CM

## 2020-03-29 LAB — BASIC METABOLIC PANEL
Anion gap: 8 (ref 5–15)
BUN: 20 mg/dL (ref 8–23)
CO2: 23 mmol/L (ref 22–32)
Calcium: 9.5 mg/dL (ref 8.9–10.3)
Chloride: 105 mmol/L (ref 98–111)
Creatinine, Ser: 0.78 mg/dL (ref 0.61–1.24)
GFR, Estimated: >60 mL/min (ref >60)
Glucose, Bld: 107 mg/dL — ABNORMAL HIGH (ref 70–99)
Potassium: 4.5 mmol/L (ref 3.5–5.1)
Sodium: 136 mmol/L (ref 135–145)

## 2020-03-29 LAB — URINALYSIS, ROUTINE W REFLEX MICROSCOPIC
Bilirubin Urine: NEGATIVE
Glucose, UA: NEGATIVE mg/dL
Ketones, ur: NEGATIVE mg/dL
Nitrite: NEGATIVE
Protein, ur: 100 mg/dL — AB
RBC / HPF: >50 RBC/hpf — ABNORMAL HIGH (ref 0–5)
Specific Gravity, Urine: 1.015 (ref 1.005–1.030)
WBC, UA: >50 WBC/hpf — ABNORMAL HIGH (ref 0–5)
pH: 6 (ref 5.0–8.0)

## 2020-03-29 NOTE — ED Provider Notes (Signed)
[facility] EMERGENCY DEPARTMENT Provider Note   CSN: [id number] Arrival date & time: 03/28/20  2117     History Chief Complaint  Patient presents with  . Dysuria    Bell Cai is a 75 y.o. male.  Patient with Foley catheter in place for the past 5 weeks at the [place].  He was seen earlier today and had a bag replaced.  He returns because the catheter was not draining.  Nursing found catheter to be kinked in his pants and immediately 400 cc of urine drained.  Patient states he is feeling fine now and not leaking.  Urine is flowing well.  No fever or vomiting.  No abdominal pain or back pain.  No pain with urination or blood in the urine.  No chest pain or shortness of breath.  No testicular pain.  The history is provided by the patient.  Dysuria Presenting symptoms: dysuria   Associated symptoms: no abdominal pain, no fever, no flank pain, no nausea and no vomiting        Past Medical History:  Diagnosis Date  . Bipolar disorder (HCC)   . Coronary artery disease   . Depression   . Hypertension     Patient Active Problem List   Diagnosis Date Noted  . Stiffness of knee joint 01/17/2012  . Difficulty in walking(719.7) 01/17/2012    Past Surgical History:  Procedure Laterality Date  . KNEE SURGERY     with replacement on right knee       No family history on file.  Social History   Tobacco Use  . Smoking status: Never Smoker  . Smokeless tobacco: Never Used  Substance Use Topics  . Alcohol use: Yes    Comment: occasional  . Drug use: No    Home Medications Prior to Admission medications   Medication Sig Start Date End Date Taking? Authorizing Provider  aspirin 81 MG tablet Take 81 mg by mouth daily.    [provider]  cephALEXin (KEFLEX) 500 MG capsule Take 1 capsule (500 mg total) by mouth 3 (three) times daily. 02/29/20   Devoria Albe, MD  clotrimazole (LOTRIMIN) 1 % cream Apply to affected area 2 times daily 05/30/19   Pollyann Savoy,  MD  doxazosin (CARDURA) 8 MG tablet Take 8 mg by mouth at bedtime.    [provider]  DULoxetine (CYMBALTA) 20 MG capsule Take 20 mg by mouth daily.    [provider]  finasteride (PROSCAR) 5 MG tablet Take 5 mg by mouth daily.    [provider]  HYDROcodone-acetaminophen (NORCO/VICODIN) 5-325 MG tablet Take 1 tablet by mouth every 6 (six) hours as needed. 10/19/19   Bethann Berkshire, MD  lisinopril (PRINIVIL,ZESTRIL) 5 MG tablet Take 5 mg by mouth daily.    [provider]  nitroGLYCERIN (NITROSTAT) 0.4 MG SL tablet Place 0.4 mg under the tongue every 5 (five) minutes as needed for chest pain.    [provider]  tamsulosin (FLOMAX) 0.4 MG CAPS Take 0.4 mg by mouth daily.    [provider]  venlafaxine XR (EFFEXOR-XR) 150 MG 24 hr capsule Take 150 mg by mouth daily.    [provider]    Allergies    Patient has no known allergies.  Review of Systems   Review of Systems  Constitutional: Negative for activity change, appetite change and fever.  HENT: Negative for congestion and rhinorrhea.   Eyes: Negative for visual disturbance.  Respiratory: Negative for cough, chest  tightness and shortness of breath.   Gastrointestinal: Negative for abdominal pain, nausea and vomiting.  Genitourinary: Positive for decreased urine volume, difficulty urinating and dysuria. Negative for flank pain.  Musculoskeletal: Negative for arthralgias and myalgias.  Skin: Negative for rash.  Neurological: Negative for dizziness, weakness and headaches.   all other systems are negative except as noted in the HPI and PMH.   Physical Exam Updated Vital Signs BP (!) 158/72   Pulse 68   Temp 98.2 F (36.8 C)   Resp 18   SpO2 100%   Physical Exam Vitals and nursing note reviewed.  Constitutional:      General: He is not in acute distress.    Appearance: He is well-developed and well-nourished.  HENT:     Head: Normocephalic and atraumatic.      Mouth/Throat:     Mouth: Oropharynx is clear and moist.     Pharynx: No oropharyngeal exudate.  Eyes:     Extraocular Movements: EOM normal.     Conjunctiva/sclera: Conjunctivae normal.     Pupils: Pupils are equal, round, and reactive to light.  Neck:     Comments: No meningismus. Cardiovascular:     Rate and Rhythm: Normal rate and regular rhythm.     Pulses: Intact distal pulses.     Heart sounds: Normal heart sounds. No murmur heard.   Pulmonary:     Effort: Pulmonary effort is normal. No respiratory distress.     Breath sounds: Normal breath sounds.  Abdominal:     Palpations: Abdomen is soft.     Tenderness: There is no abdominal tenderness. There is no guarding or rebound.  Genitourinary:    Comments: Foley catheter in place draining clear yellow urine  No testicular tenderness Musculoskeletal:        General: No tenderness or edema. Normal range of motion.     Cervical back: Normal range of motion and neck supple.  Skin:    General: Skin is warm.  Neurological:     Mental Status: He is alert and oriented to person, place, and time.     Cranial Nerves: No cranial nerve deficit.     Motor: No abnormal muscle tone.     Coordination: Coordination normal.     Comments: No ataxia on finger to nose bilaterally. No pronator drift. 5/5 strength throughout. CN 2-12 intact.Equal grip strength. Sensation intact.   Psychiatric:        Mood and Affect: Mood and affect normal.        Behavior: Behavior normal.     ED Results / Procedures / Treatments   Labs (all labs ordered are listed, but only abnormal results are displayed) Labs Reviewed  URINALYSIS, ROUTINE W REFLEX MICROSCOPIC - Abnormal; Notable for the following components:      Result Value   APPearance HAZY (*)    Hgb urine dipstick MODERATE (*)    Protein, ur 100 (*)    Leukocytes,Ua MODERATE (*)    RBC / HPF >50 (*)    WBC, UA >50 (*)    Bacteria, UA FEW (*)    All other components within normal limits   BASIC METABOLIC PANEL - Abnormal; Notable for the following components:   Glucose, Bld 107 (*)    All other components within normal limits  URINE CULTURE    EKG None  Radiology No results found.  Procedures Procedures   Medications Ordered in ED Medications - No data to display  ED Course  I have reviewed  the triage vital signs and the nursing notes.  Pertinent labs & imaging results that were available during my care of the patient were reviewed by me and considered in my medical decision making (see chart for details).    MDM Rules/Calculators/A&P                         Malfunction of Foley catheter, now resolved.  Patient asymptomatic  Abdomen soft, nontender.  Foley draining appropriately.  Given patient's recurrent visits with urinary retention and dysfunction of his catheter, will evaluate kidney function as there are no recent values in system.  Urinalysis shows red and white blood cells.  Will send for culture.  Creatinine is normal.  Follow-up with PCP as well as urology.  Return precautions discussed Final Clinical Impression(s) / ED Diagnoses Final diagnoses:  Urinary retention    Rx / DC Orders ED Discharge Orders    None       Ramie Palladino, Jeannett Senior, MD 03/29/20 [phone]

## 2020-03-29 NOTE — ED Notes (Signed)
Pt line was kinked in his pants. Once resolved drained into the bag. Bladder scan showed 58ml in bladder.

## 2020-03-29 NOTE — Discharge Instructions (Signed)
Follow-up with the urologist.  You will be called if your urine grows something that needs an antibiotic.  Return to the ED if you develop new or worsening symptoms

## 2020-03-30 NOTE — ED Provider Notes (Signed)
[facility] EMERGENCY DEPARTMENT Provider Note   CSN: [id number] Arrival date & time: 03/28/20  1631     History Chief Complaint  Patient presents with  . foley problem    Mario Massey is a 75 y.o. male.  HPI      Mario Massey is a 75 y.o. male with chronic in-dwelling foley catheter, who presents to the Emergency Department requesting change of his foley leg bag.  States that he noticed leaking of his leg bag and noticed a tear to the lower area of the bag.  Denies any symptoms or trauma.  States urine flow through the catheter has been normal.    Past Medical History:  Diagnosis Date  . Bipolar disorder (HCC)   . Coronary artery disease   . Depression   . Hypertension     Patient Active Problem List   Diagnosis Date Noted  . Stiffness of knee joint 01/17/2012  . Difficulty in walking(719.7) 01/17/2012    Past Surgical History:  Procedure Laterality Date  . KNEE SURGERY     with replacement on right knee       No family history on file.  Social History   Tobacco Use  . Smoking status: Never Smoker  . Smokeless tobacco: Never Used  Substance Use Topics  . Alcohol use: Yes    Comment: occasional  . Drug use: No    Home Medications Prior to Admission medications   Medication Sig Start Date End Date Taking? Authorizing Provider  aspirin 81 MG tablet Take 81 mg by mouth daily.    [provider]  cephALEXin (KEFLEX) 500 MG capsule Take 1 capsule (500 mg total) by mouth 3 (three) times daily. 02/29/20   Devoria Albe, MD  clotrimazole (LOTRIMIN) 1 % cream Apply to affected area 2 times daily 05/30/19   Pollyann Savoy, MD  doxazosin (CARDURA) 8 MG tablet Take 8 mg by mouth at bedtime.    [provider]  DULoxetine (CYMBALTA) 20 MG capsule Take 20 mg by mouth daily.    [provider]  finasteride (PROSCAR) 5 MG tablet Take 5 mg by mouth daily.    [provider]  HYDROcodone-acetaminophen  (NORCO/VICODIN) 5-325 MG tablet Take 1 tablet by mouth every 6 (six) hours as needed. 10/19/19   Bethann Berkshire, MD  lisinopril (PRINIVIL,ZESTRIL) 5 MG tablet Take 5 mg by mouth daily.    [provider]  nitroGLYCERIN (NITROSTAT) 0.4 MG SL tablet Place 0.4 mg under the tongue every 5 (five) minutes as needed for chest pain.    [provider]  tamsulosin (FLOMAX) 0.4 MG CAPS Take 0.4 mg by mouth daily.    [provider]  venlafaxine XR (EFFEXOR-XR) 150 MG 24 hr capsule Take 150 mg by mouth daily.    [provider]    Allergies    Patient has no known allergies.  Review of Systems   Review of Systems  Constitutional: Negative for chills, fatigue and fever.  Cardiovascular: Negative for chest pain and leg swelling.  Gastrointestinal: Negative for abdominal pain, diarrhea, nausea and vomiting.  Genitourinary: Negative for decreased urine volume, difficulty urinating, dysuria, flank pain, hematuria, penile swelling, scrotal swelling and testicular pain.  Musculoskeletal: Negative for arthralgias, back pain and myalgias.  Skin: Negative for color change and rash.  Neurological: Negative for dizziness and weakness.  Hematological: Does not bruise/bleed easily.    Physical Exam Updated Vital Signs BP (!) 149/83 (BP Location: Right Arm)   Pulse  93   Temp 98.1 F (36.7 C)   Resp 17   SpO2 97%   Physical Exam Vitals and nursing note reviewed.  Constitutional:      Appearance: Normal appearance. He is not ill-appearing.  Cardiovascular:     Rate and Rhythm: Normal rate and regular rhythm.     Pulses: Normal pulses.  Pulmonary:     Effort: Pulmonary effort is normal. No respiratory distress.  Abdominal:     General: There is no distension.     Palpations: Abdomen is soft.     Tenderness: There is no abdominal tenderness.  Genitourinary:    Comments: Pt with in dwelling foley catheter with leg bag.  Small tear to the lower bag near the port.   Straw colored urine flowing into the bag no visual blood clots Musculoskeletal:        General: Normal range of motion.     Right lower leg: No edema.     Left lower leg: No edema.  Skin:    General: Skin is warm.     Findings: No erythema or rash.  Neurological:     General: No focal deficit present.     Mental Status: He is alert.     Sensory: No sensory deficit.     Motor: No weakness.     ED Results / Procedures / Treatments   Labs (all labs ordered are listed, but only abnormal results are displayed) Labs Reviewed - No data to display  EKG None  Radiology No results found.  Procedures Procedures   Medications Ordered in ED Medications - No data to display  ED Course  I have reviewed the triage vital signs and the nursing notes.  Pertinent labs & imaging results that were available during my care of the patient were reviewed by me and considered in my medical decision making (see chart for details).    MDM Rules/Calculators/A&P                          Pt here requesting replacement of foley leg bag.  Has visualized tear of the bag.  Pt denies symptoms.  Well appearing.    Bag changed by nursing.  On recheck, no further leaking   Final Clinical Impression(s) / ED Diagnoses Final diagnoses:  Problem with Foley catheter, initial encounter [facility])    Rx / DC Orders ED Discharge Orders    None       Pauline Aus, PA-C 03/30/20 1041    Vanetta Mulders, MD 04/06/20 [phone]

## 2020-03-31 LAB — URINE CULTURE: Culture: >=100000 — AB

## 2020-04-01 NOTE — Progress Notes (Signed)
ED Antimicrobial Stewardship Positive Culture Follow Up   Mario Massey is an 75 y.o. male who presented to [facility] on 03/29/2020 with a chief complaint of foley catheter not draining. Chief Complaint  Patient presents with  . Dysuria    Recent Results (from the past 720 hour(s))  Urine Culture     Status: Abnormal   Collection Time: 03/29/20 12:58 AM   Specimen: Urine, Clean Catch  Result Value Ref Range Status   Specimen Description   Final    URINE, CLEAN CATCH Performed at [facility], [address] [place], [place] [address]    Special Requests   Final    NONE Performed at [facility], [address] [place], [place] [address]    Culture >=100,000 COLONIES/mL ENTEROBACTER CLOACAE (A)  Final   Report Status 03/31/2020 FINAL  Final   Organism ID, Bacteria ENTEROBACTER CLOACAE (A)  Final      Susceptibility   Enterobacter cloacae - MIC*    CEFAZOLIN RESISTANT Resistant     CEFEPIME <=0.12 SENSITIVE Sensitive     CIPROFLOXACIN <=0.25 SENSITIVE Sensitive     GENTAMICIN <=1 SENSITIVE Sensitive     IMIPENEM <=0.25 SENSITIVE Sensitive     NITROFURANTOIN 32 SENSITIVE Sensitive     TRIMETH/SULFA <=20 SENSITIVE Sensitive     PIP/TAZO <=4 SENSITIVE Sensitive     * >=100,000 COLONIES/mL ENTEROBACTER CLOACAE    Foley was found to be kinked. This was fixed and patient drained 400cc of urine and felt fine after. No physical symptoms of UTI. Likely colonizer.   Patient placement to call patient for symptom check. If no new symptoms, then no antibiotics necessary. If patient is displaying any new symptoms of UTI, may start Bactrim DS 1 tablet twice daily x 7 days.   ED Provider: Elaina Pattee, PA-C   Vinnie Level, PharmD., BCPS, BCCCP Clinical Pharmacist Please refer to [facility] for unit-specific pharmacist

## 2020-05-11 ENCOUNTER — Other Ambulatory Visit: Payer: Self-pay

## 2020-05-11 ENCOUNTER — Emergency Department (HOSPITAL_COMMUNITY)
Admission: EM | Admit: 2020-05-11 | Discharge: 2020-05-11 | Disposition: A | Discharge status: Home / Self Care | Payer: No Typology Code available for payment source | Attending: Emergency Medicine | Admitting: Emergency Medicine

## 2020-05-11 ENCOUNTER — Encounter (HOSPITAL_COMMUNITY): Payer: Self-pay | Admitting: Emergency Medicine

## 2020-05-11 DIAGNOSIS — N4829 Other inflammatory disorders of penis: Secondary | ICD-10-CM | POA: Insufficient documentation

## 2020-05-11 DIAGNOSIS — Z978 Presence of other specified devices: Secondary | ICD-10-CM

## 2020-05-11 DIAGNOSIS — I10 Essential (primary) hypertension: Secondary | ICD-10-CM | POA: Insufficient documentation

## 2020-05-11 DIAGNOSIS — I2510 Atherosclerotic heart disease of native coronary artery without angina pectoris: Secondary | ICD-10-CM | POA: Insufficient documentation

## 2020-05-11 DIAGNOSIS — Z79899 Other long term (current) drug therapy: Secondary | ICD-10-CM | POA: Diagnosis not present

## 2020-05-11 DIAGNOSIS — Z7982 Long term (current) use of aspirin: Secondary | ICD-10-CM | POA: Insufficient documentation

## 2020-05-11 DIAGNOSIS — N3000 Acute cystitis without hematuria: Secondary | ICD-10-CM

## 2020-05-11 LAB — URINALYSIS, ROUTINE W REFLEX MICROSCOPIC
Bilirubin Urine: NEGATIVE
Glucose, UA: NEGATIVE mg/dL
Ketones, ur: NEGATIVE mg/dL
Nitrite: POSITIVE — AB
Protein, ur: 30 mg/dL — AB
Specific Gravity, Urine: 1.014 (ref 1.005–1.030)
WBC, UA: >50 WBC/hpf — ABNORMAL HIGH (ref 0–5)
pH: 5 (ref 5.0–8.0)

## 2020-05-11 MED ORDER — CEPHALEXIN 500 MG PO CAPS
500.0000 mg | ORAL_CAPSULE | Freq: Once | ORAL | Status: AC
  Administered 2020-05-11: 500 mg via ORAL
  Filled 2020-05-11: qty 1

## 2020-05-11 MED ORDER — CEPHALEXIN 500 MG PO CAPS: 500.0000 mg | ORAL_CAPSULE | Freq: Three times a day (TID) | ORAL | 0 refills | Status: DC

## 2020-05-11 NOTE — ED Provider Notes (Signed)
[facility] EMERGENCY DEPARTMENT Provider Note   CSN: [id number] Arrival date & time: 05/11/20  0431     History Chief Complaint  Patient presents with  . Penis Pain    Mario Massey is a 75 y.o. male.  Patient is a 75 year old male with history of hypertension, depression, coronary artery disease, and bipolar.  Patient has been having issues with his bladder for the past year and has had an indwelling Foley catheter.  He presents today with complaints of increased discomfort in the area of his catheter.  He denies any fevers or chills.  He denies any abdominal pain.  The history is provided by the patient.       Past Medical History:  Diagnosis Date  . Bipolar disorder (HCC)   . Coronary artery disease   . Depression   . Hypertension     Patient Active Problem List   Diagnosis Date Noted  . Stiffness of knee joint 01/17/2012  . Difficulty in walking(719.7) 01/17/2012    Past Surgical History:  Procedure Laterality Date  . KNEE SURGERY     with replacement on right knee       No family history on file.  Social History   Tobacco Use  . Smoking status: Never Smoker  . Smokeless tobacco: Never Used  Substance Use Topics  . Alcohol use: Yes    Comment: occasional  . Drug use: No    Home Medications Prior to Admission medications   Medication Sig Start Date End Date Taking? Authorizing Provider  aspirin 81 MG tablet Take 81 mg by mouth daily.    [provider]  cephALEXin (KEFLEX) 500 MG capsule Take 1 capsule (500 mg total) by mouth 3 (three) times daily. 02/29/20   Devoria Albe, MD  clotrimazole (LOTRIMIN) 1 % cream Apply to affected area 2 times daily 05/30/19   Pollyann Savoy, MD  doxazosin (CARDURA) 8 MG tablet Take 8 mg by mouth at bedtime.    [provider]  DULoxetine (CYMBALTA) 20 MG capsule Take 20 mg by mouth daily.    [provider]  finasteride (PROSCAR) 5 MG tablet Take 5 mg by mouth daily.    [provider]  HYDROcodone-acetaminophen (NORCO/VICODIN) 5-325 MG tablet Take 1 tablet by mouth every 6 (six) hours as needed. 10/19/19   Bethann Berkshire, MD  lisinopril (PRINIVIL,ZESTRIL) 5 MG tablet Take 5 mg by mouth daily.    [provider]  nitroGLYCERIN (NITROSTAT) 0.4 MG SL tablet Place 0.4 mg under the tongue every 5 (five) minutes as needed for chest pain.    [provider]  tamsulosin (FLOMAX) 0.4 MG CAPS Take 0.4 mg by mouth daily.    [provider]  venlafaxine XR (EFFEXOR-XR) 150 MG 24 hr capsule Take 150 mg by mouth daily.    [provider]    Allergies    Patient has no known allergies.  Review of Systems   Review of Systems  All other systems reviewed and are negative.   Physical Exam Updated Vital Signs BP (!) 147/95 (BP Location: Left Arm)   Pulse 74   Temp 98.2 F (36.8 C) (Oral)   Resp 17   Ht 6' (1.829 m)   Wt 118 kg   SpO2 98%   BMI 35.28 kg/m   Physical Exam Vitals and nursing note reviewed.  Constitutional:      General: He is not in acute distress.    Appearance: Normal appearance. He is not  ill-appearing.  HENT:     Head: Normocephalic and atraumatic.  Pulmonary:     Effort: Pulmonary effort is normal.  Abdominal:     General: Abdomen is flat. There is no distension.     Palpations: Abdomen is soft. There is no mass.     Tenderness: There is no abdominal tenderness.     Hernia: No hernia is present.  Genitourinary:    Penis: Normal.      Testes: Normal.     Comments: The glans and insertion site of the catheter has debris and purulent material present. Skin:    General: Skin is warm and dry.  Neurological:     Mental Status: He is alert.     ED Results / Procedures / Treatments   Labs (all labs ordered are listed, but only abnormal results are displayed) Labs Reviewed - No data to display  EKG None  Radiology No results found.  Procedures Procedures   Medications Ordered in  ED Medications - No data to display  ED Course  I have reviewed the triage vital signs and the nursing notes.  Pertinent labs & imaging results that were available during my care of the patient were reviewed by me and considered in my medical decision making (see chart for details).    MDM Rules/Calculators/A&P  Patient with indwelling Foley catheter presenting with complaints of burning in the area of his penis that appears related to poor catheter hygiene.  The Foley catheter was changed and his glans was cleaned.  Urinalysis consistent with UTI.  Patient will be treated with Keflex and is to follow-up with his urologist next week if not improving.  Final Clinical Impression(s) / ED Diagnoses Final diagnoses:  None    Rx / DC Orders ED Discharge Orders    None       Geoffery Lyons, MD 05/11/20 [phone]

## 2020-05-11 NOTE — ED Triage Notes (Signed)
Pt has catheter in place and c/o penis pain x 3 hours. Pt denies any trauma to area.

## 2020-05-11 NOTE — Discharge Instructions (Addendum)
Begin taking Keflex as prescribed.  Follow-up with your urologist next week if symptoms are not improving, and return to the ER if symptoms significantly worsen or change.

## 2020-07-12 ENCOUNTER — Encounter (HOSPITAL_COMMUNITY): Payer: Self-pay

## 2020-07-12 ENCOUNTER — Emergency Department (HOSPITAL_COMMUNITY)
Admission: EM | Admit: 2020-07-12 | Discharge: 2020-07-12 | Disposition: A | Discharge status: Home / Self Care | Payer: No Typology Code available for payment source | Attending: Emergency Medicine | Admitting: Emergency Medicine

## 2020-07-12 ENCOUNTER — Other Ambulatory Visit: Payer: Self-pay

## 2020-07-12 DIAGNOSIS — Y846 Urinary catheterization as the cause of abnormal reaction of the patient, or of later complication, without mention of misadventure at the time of the procedure: Secondary | ICD-10-CM | POA: Diagnosis not present

## 2020-07-12 DIAGNOSIS — I2510 Atherosclerotic heart disease of native coronary artery without angina pectoris: Secondary | ICD-10-CM | POA: Insufficient documentation

## 2020-07-12 DIAGNOSIS — Z7982 Long term (current) use of aspirin: Secondary | ICD-10-CM | POA: Insufficient documentation

## 2020-07-12 DIAGNOSIS — Z79899 Other long term (current) drug therapy: Secondary | ICD-10-CM | POA: Diagnosis not present

## 2020-07-12 DIAGNOSIS — T83091A Other mechanical complication of indwelling urethral catheter, initial encounter: Secondary | ICD-10-CM | POA: Insufficient documentation

## 2020-07-12 DIAGNOSIS — I10 Essential (primary) hypertension: Secondary | ICD-10-CM | POA: Diagnosis not present

## 2020-07-12 DIAGNOSIS — T839XXA Unspecified complication of genitourinary prosthetic device, implant and graft, initial encounter: Secondary | ICD-10-CM

## 2020-07-12 NOTE — ED Triage Notes (Signed)
Pt to er, pt states that he is here to get a leg bag because his is falling apart. States that he has no physical complaints, just wants a new bag and doesn't know where else to get one.

## 2020-07-12 NOTE — Discharge Instructions (Addendum)
Contact your primary urologist or medical MD at the [facility] who should be able to provide you with future leg bags or any other equipment you may need to avoid having to come to the emergency room for these supplies.

## 2020-07-14 NOTE — ED Provider Notes (Signed)
[facility] EMERGENCY DEPARTMENT Provider Note   CSN: [id number] Arrival date & time: 07/12/20  1704     History Chief Complaint  Patient presents with   Other    Pt wants a leg bag    Chavez Rosol is a 75 y.o. 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  He is followed by the [facility] for this condition.  He presents today simply for a replacement leg bag as his bag is starting to fall apart.  He denies any other complaints including dysuria, hematuria.  Also no fevers or chills, abdominal pain.  The history is provided by the patient.      Past Medical History:  Diagnosis Date   Bipolar disorder (HCC)    Coronary artery disease    Depression    Hypertension     Patient Active Problem List   Diagnosis Date Noted   Stiffness of knee joint 01/17/2012   Difficulty in walking(719.7) 01/17/2012    Past Surgical History:  Procedure Laterality Date   KNEE SURGERY     with replacement on right knee       History reviewed. No pertinent family history.  Social History   Tobacco Use   Smoking status: Never   Smokeless tobacco: Never  Vaping Use   Vaping Use: Never used  Substance Use Topics   Alcohol use: Yes    Comment: occasional   Drug use: No    Home Medications Prior to Admission medications   Medication Sig Start Date End Date Taking? Authorizing Provider  aspirin 81 MG tablet Take 81 mg by mouth daily.    [provider]  cephALEXin (KEFLEX) 500 MG capsule Take 1 capsule (500 mg total) by mouth 3 (three) times daily. 05/11/20   Geoffery Lyons, MD  clotrimazole (LOTRIMIN) 1 % cream Apply to affected area 2 times daily 05/30/19   Pollyann Savoy, MD  doxazosin (CARDURA) 8 MG tablet Take 8 mg by mouth at bedtime.    [provider]  DULoxetine (CYMBALTA) 20 MG capsule Take 20 mg by mouth daily.    [provider]  finasteride  (PROSCAR) 5 MG tablet Take 5 mg by mouth daily.    [provider]  HYDROcodone-acetaminophen (NORCO/VICODIN) 5-325 MG tablet Take 1 tablet by mouth every 6 (six) hours as needed. 10/19/19   Bethann Berkshire, MD  lisinopril (PRINIVIL,ZESTRIL) 5 MG tablet Take 5 mg by mouth daily.    [provider]  nitroGLYCERIN (NITROSTAT) 0.4 MG SL tablet Place 0.4 mg under the tongue every 5 (five) minutes as needed for chest pain.    [provider]  tamsulosin (FLOMAX) 0.4 MG CAPS Take 0.4 mg by mouth daily.    [provider]  venlafaxine XR (EFFEXOR-XR) 150 MG 24 hr capsule Take 150 mg by mouth daily.    [provider]    Allergies    Patient has no known allergies.  Review of Systems   Review of Systems  Constitutional:  Negative for fever.  HENT: Negative.    Respiratory: Negative.    Cardiovascular: Negative.   Gastrointestinal:  Negative for abdominal pain and nausea.  Genitourinary: Negative.  Negative for dysuria and hematuria.  Musculoskeletal:  Negative for arthralgias, joint swelling and neck pain.  Skin: Negative.  Negative for rash and wound.  Neurological:  Negative for dizziness, weakness, light-headedness, numbness and headaches.  Psychiatric/Behavioral: Negative.  Physical Exam Updated Vital Signs BP (!) 149/77 (BP Location: Right Arm)   Pulse (!) 50   Temp 98.4 F (36.9 C) (Oral)   Resp 17   Ht 6' (1.829 m)   Wt 120.2 kg   SpO2 96%   BMI 35.94 kg/m   Physical Exam Vitals and nursing note reviewed.  Constitutional:      Appearance: He is well-developed.  HENT:     Head: Normocephalic and atraumatic.  Eyes:     Conjunctiva/sclera: Conjunctivae normal.  Cardiovascular:     Rate and Rhythm: Normal rate.  Pulmonary:     Effort: Pulmonary effort is normal.     Breath sounds: No wheezing.  Abdominal:     General: Bowel sounds are normal.     Palpations: Abdomen is soft.     Tenderness: There is no abdominal tenderness.   Musculoskeletal:        General: Normal range of motion.     Cervical back: Normal range of motion.  Skin:    General: Skin is warm and dry.  Neurological:     General: No focal deficit present.     Mental Status: He is alert and oriented to person, place, and time.    ED Results / Procedures / Treatments   Labs (all labs ordered are listed, but only abnormal results are displayed) Labs Reviewed - No data to display  EKG None  Radiology No results found.  Procedures Procedures   Medications Ordered in ED Medications - No data to display  ED Course  I have reviewed the triage vital signs and the nursing notes.  Pertinent labs & imaging results that were available during my care of the patient were reviewed by me and considered in my medical decision making (see chart for details).    MDM Rules/Calculators/A&P                          Patient presenting simply for a Foley leg bag which was provided.  He expressed frustration in the wait time in order to be seen for this.  I strongly encouraged him to follow-up with his PCP or his urologist to provide these bags for him so he does not have to come to the emergency room for his bags.  He was also given the suggestion that he can order these products online as well, he was given some resources by our RN today in order to accomplish this.  As needed follow-up anticipated. Final Clinical Impression(s) / ED Diagnoses Final diagnoses:  Problem with Foley catheter, initial encounter [facility])    Rx / DC Orders ED Discharge Orders     None        Victoriano Lain 07/14/20 1248    Long, Arlyss Repress, MD 07/17/20 [phone]

## 2020-07-28 ENCOUNTER — Emergency Department (HOSPITAL_COMMUNITY)
Admission: EM | Admit: 2020-07-28 | Discharge: 2020-07-28 | Disposition: A | Discharge status: Home / Self Care | Payer: No Typology Code available for payment source | Attending: Emergency Medicine | Admitting: Emergency Medicine

## 2020-07-28 ENCOUNTER — Other Ambulatory Visit: Payer: Self-pay

## 2020-07-28 DIAGNOSIS — I2510 Atherosclerotic heart disease of native coronary artery without angina pectoris: Secondary | ICD-10-CM | POA: Insufficient documentation

## 2020-07-28 DIAGNOSIS — I10 Essential (primary) hypertension: Secondary | ICD-10-CM | POA: Diagnosis not present

## 2020-07-28 DIAGNOSIS — N390 Urinary tract infection, site not specified: Secondary | ICD-10-CM | POA: Insufficient documentation

## 2020-07-28 DIAGNOSIS — R309 Painful micturition, unspecified: Secondary | ICD-10-CM | POA: Diagnosis present

## 2020-07-28 LAB — URINALYSIS, ROUTINE W REFLEX MICROSCOPIC
Bacteria, UA: NONE SEEN
Bilirubin Urine: NEGATIVE
Glucose, UA: NEGATIVE mg/dL
Ketones, ur: NEGATIVE mg/dL
Nitrite: NEGATIVE
Protein, ur: 100 mg/dL — AB
Specific Gravity, Urine: 1.019 (ref 1.005–1.030)
WBC, UA: >50 WBC/hpf — ABNORMAL HIGH (ref 0–5)
pH: 6 (ref 5.0–8.0)

## 2020-07-28 MED ORDER — LIDOCAINE HCL URETHRAL/MUCOSAL 2 % EX GEL: 1.0000 "application " | Freq: Once | CUTANEOUS | Status: DC

## 2020-07-28 MED ORDER — SULFAMETHOXAZOLE-TRIMETHOPRIM 800-160 MG PO TABS: 1.0000 | ORAL_TABLET | Freq: Two times a day (BID) | ORAL | 0 refills | Status: AC

## 2020-07-28 NOTE — ED Provider Notes (Signed)
[facility]-EMERGENCY DEPT [facility] Emergency Department Provider Note MRN:  [id number]  Arrival date & time: 07/28/20     Chief Complaint   Burning with urination History of Present Illness   Mario Massey is a 75 y.o. year-old male with a history of hypertension, CAD presenting to the ED with chief complaint of burning with urination.  Burning of the penis with urinating over the past several days.  Explains that he has an indwelling Foley catheter that has not been changed in a few months.  Supposed to be managed by the [place].  Pain is mild, constant, no exacerbating or alleviating factors.  Denies any fever, no abdominal pain, no flank pain, no other complaints.  Review of Systems  A complete 10 system review of systems was obtained and all systems are negative except as noted in the HPI and PMH.   Patient's Health History    Past Medical History:  Diagnosis Date   Bipolar disorder (HCC)    Coronary artery disease    Depression    Hypertension     Past Surgical History:  Procedure Laterality Date   KNEE SURGERY     with replacement on right knee    No family history on file.  Social History   Socioeconomic History   Marital status: Divorced    Spouse name: Not on file   Number of children: Not on file   Years of education: Not on file   Highest education level: Not on file  Occupational History   Not on file  Tobacco Use   Smoking status: Never   Smokeless tobacco: Never  Vaping Use   Vaping Use: Never used  Substance and Sexual Activity   Alcohol use: Yes    Comment: occasional   Drug use: No   Sexual activity: Not on file  Other Topics Concern   Not on file  Social History Narrative   Not on file   Social Determinants of Health   Financial Resource Strain: Not on file  Food Insecurity: Not on file  Transportation Needs: Not on file  Physical Activity: Not on file  Stress: Not on file  Social Connections: Not on file  Intimate Partner  Violence: Not on file     Physical Exam   Vitals:   07/28/20 0456 07/28/20 0530  BP: (!) 187/66 (!) 147/69  Pulse: 66 68  Resp: 20 11  Temp: 98.5 F (36.9 C)   SpO2: 97% 97%    CONSTITUTIONAL: Chronically ill-appearing, NAD NEURO:  Alert and oriented x 3, no focal deficits EYES:  eyes equal and reactive ENT/NECK:  no LAD, no JVD CARDIO: Regular rate, well-perfused, normal S1 and S2 PULM:  CTAB no wheezing or rhonchi GI/GU:  normal bowel sounds, non-distended, non-tender MSK/SPINE:  No gross deformities, no edema SKIN:  no rash, atraumatic PSYCH:  Appropriate speech and behavior  *Additional and/or pertinent findings included in MDM below  Diagnostic and Interventional Summary    EKG Interpretation  Date/Time:    Ventricular Rate:    PR Interval:    QRS Duration:   QT Interval:    QTC Calculation:   R Axis:     Text Interpretation:          Labs Reviewed  URINALYSIS, ROUTINE W REFLEX MICROSCOPIC - Abnormal; Notable for the following components:      Result Value   Color, Urine AMBER (*)    APPearance TURBID (*)    Hgb urine dipstick SMALL (*)  Protein, ur 100 (*)    Leukocytes,Ua MODERATE (*)    WBC, UA >50 (*)    All other components within normal limits  URINE CULTURE    No orders to display    Medications  lidocaine (XYLOCAINE) 2 % jelly 1 application (1 application Urethral Not Given 07/28/20 0533)     Procedures  /  Critical Care Procedures  ED Course and Medical Decision Making  I have reviewed the triage vital signs, the nursing notes, and pertinent available records from the EMR.  Listed above are laboratory and imaging tests that I personally ordered, reviewed, and interpreted and then considered in my medical decision making (see below for details).  Foley catheter has been in for an extended period of time, removed and replaced with a sample of urine from the new bag showing signs of infection.  We will treat with Bactrim based on prior  sensitivities.  Otherwise no symptoms, feeling well after Foley replacement, normal vitals, nothing to suggest systemic infection.  Appropriate for discharge.       Elmer Sow. Pilar Plate, MD [facility] Health Emergency Medicine [facility] Health [email or web address]  Final Clinical Impressions(s) / ED Diagnoses     ICD-10-CM   1. Lower urinary tract infectious disease  N39.0       ED Discharge Orders          Ordered    sulfamethoxazole-trimethoprim (BACTRIM DS) 800-160 MG tablet  2 times daily        07/28/20 0557             Discharge Instructions Discussed with and Provided to Patient:    Discharge Instructions      You were evaluated in the Emergency Department and after careful evaluation, we did not find any emergent condition requiring admission or further testing in the hospital.  Your exam/testing today was overall reassuring.  Urine sample shows some signs of infection.  Please take the Bactrim antibiotic as directed and follow-up with your regular doctors.  Please return to the Emergency Department if you experience any worsening of your condition.  Thank you for allowing [place] to be a part of your care.        Sabas Sous, MD 07/28/20 [phone]

## 2020-07-28 NOTE — Discharge Instructions (Addendum)
You were evaluated in the Emergency Department and after careful evaluation, we did not find any emergent condition requiring admission or further testing in the hospital.  Your exam/testing today was overall reassuring.  Urine sample shows some signs of infection.  Please take the Bactrim antibiotic as directed and follow-up with your regular doctors.  Please return to the Emergency Department if you experience any worsening of your condition.  Thank you for allowing [place] to be a part of your care.

## 2020-07-28 NOTE — ED Triage Notes (Signed)
Pt reports urinary pressure and pain when he "pushes to pee" States indwelling catheter last changed approx 2 months ago at the [place]

## 2020-07-28 NOTE — ED Notes (Signed)
Bladder scan 145ml

## 2020-08-01 LAB — URINE CULTURE: Culture: >=100000 — AB

## 2020-08-02 ENCOUNTER — Telehealth: Payer: Self-pay

## 2020-08-02 NOTE — Progress Notes (Signed)
ED Antimicrobial Stewardship Positive Culture Follow Up   Mario Massey is an 75 y.o. male who presented to [facility] on 07/28/2020 with a chief complaint of  Chief Complaint  Patient presents with   Urinary Retention    Recent Results (from the past 720 hour(s))  Urine culture     Status: Abnormal   Collection Time: 07/28/20  5:16 AM   Specimen: Urine, Clean Catch  Result Value Ref Range Status   Specimen Description   Final    URINE, CLEAN CATCH Performed at [facility], [address] [place], [place] [address]    Special Requests   Final    NONE Performed at [facility], [address], [place], [place] [address]    Culture (A)  Final    >=100,000 COLONIES/mL ENTEROBACTER CLOACAE >=100,000 COLONIES/mL PSEUDOMONAS AERUGINOSA    Report Status 08/01/2020 FINAL  Final   Organism ID, Bacteria ENTEROBACTER CLOACAE (A)  Final   Organism ID, Bacteria PSEUDOMONAS AERUGINOSA (A)  Final      Susceptibility   Enterobacter cloacae - MIC*    CEFAZOLIN >=64 RESISTANT Resistant     CEFEPIME <=0.12 SENSITIVE Sensitive     CIPROFLOXACIN <=0.25 SENSITIVE Sensitive     GENTAMICIN <=1 SENSITIVE Sensitive     IMIPENEM <=0.25 SENSITIVE Sensitive     NITROFURANTOIN <=16 SENSITIVE Sensitive     TRIMETH/SULFA <=20 SENSITIVE Sensitive     PIP/TAZO <=4 SENSITIVE Sensitive     * >=100,000 COLONIES/mL ENTEROBACTER CLOACAE   Pseudomonas aeruginosa - MIC*    CEFTAZIDIME 4 SENSITIVE Sensitive     CIPROFLOXACIN <=0.25 SENSITIVE Sensitive     GENTAMICIN <=1 SENSITIVE Sensitive     IMIPENEM 2 SENSITIVE Sensitive     PIP/TAZO 8 SENSITIVE Sensitive     CEFEPIME 2 SENSITIVE Sensitive     * >=100,000 COLONIES/mL PSEUDOMONAS AERUGINOSA    [x]  Treated with Bactrim - which covers Enterobacter portion of culture []  Patient discharged originally without antimicrobial agent and treatment is now indicated  Recommendations: No other antibiotic warranted at this time, please call patient for UTI  symptom check and have them follow up with PCP and notify PCP of culture results.  ED Provider: , PA-C  , PharmD, [facility] Emergency Medicine Clinical Pharmacist ED RPh Phone: [phone] Main RX: [phone]

## 2020-08-02 NOTE — Telephone Encounter (Signed)
Post ED Visit - Positive Culture Follow-up: Successful Patient Follow-Up  Culture assessed and recommendations reviewed by:  [x]  , Pharm.D. []  Loleta Dicker, Pharm.D., BCPS AQ-ID []  , Pharm.D., BCPS []  Celedonio Miyamoto, .D., BCPS []  [place], .D., BCPS, AAHIVP []  Georgina Pillion, Pharm.D., BCPS, AAHIVP []  [address], PharmD, BCPS []  , PharmD, BCPS []  [place], PharmD, BCPS []  [place], PharmD  Positive urine culture  [x]  Patient discharged with Sulfamethoxazole-Trimethoprim []  Organism is resistant to prescribed ED discharge antimicrobial []  Patient with positive blood cultures  Changes discussed with ED provider: , PA Symptoms check, if positive symptoms follow up with your primary physician    Attempted to contact patient, date 08/02/2020, time 10:30 am. Left message and mailed letter for call back. Faxed culture results to [facility] @ [phone]   08/02/2020, 10:33 AM

## 2020-08-16 ENCOUNTER — Emergency Department (HOSPITAL_COMMUNITY)
Admission: EM | Admit: 2020-08-16 | Discharge: 2020-08-16 | Disposition: A | Discharge status: Home / Self Care | Payer: No Typology Code available for payment source | Attending: Emergency Medicine | Admitting: Emergency Medicine

## 2020-08-16 ENCOUNTER — Other Ambulatory Visit: Payer: Self-pay

## 2020-08-16 ENCOUNTER — Emergency Department (HOSPITAL_COMMUNITY): Payer: No Typology Code available for payment source

## 2020-08-16 DIAGNOSIS — I2510 Atherosclerotic heart disease of native coronary artery without angina pectoris: Secondary | ICD-10-CM | POA: Diagnosis not present

## 2020-08-16 DIAGNOSIS — N390 Urinary tract infection, site not specified: Secondary | ICD-10-CM

## 2020-08-16 DIAGNOSIS — R072 Precordial pain: Secondary | ICD-10-CM | POA: Diagnosis not present

## 2020-08-16 DIAGNOSIS — I119 Hypertensive heart disease without heart failure: Secondary | ICD-10-CM | POA: Insufficient documentation

## 2020-08-16 DIAGNOSIS — Y733 Surgical instruments, materials and gastroenterology and urology devices (including sutures) associated with adverse incidents: Secondary | ICD-10-CM | POA: Diagnosis not present

## 2020-08-16 DIAGNOSIS — Z7982 Long term (current) use of aspirin: Secondary | ICD-10-CM | POA: Insufficient documentation

## 2020-08-16 DIAGNOSIS — Z79899 Other long term (current) drug therapy: Secondary | ICD-10-CM | POA: Diagnosis not present

## 2020-08-16 DIAGNOSIS — T83511A Infection and inflammatory reaction due to indwelling urethral catheter, initial encounter: Secondary | ICD-10-CM | POA: Insufficient documentation

## 2020-08-16 DIAGNOSIS — Z96651 Presence of right artificial knee joint: Secondary | ICD-10-CM | POA: Insufficient documentation

## 2020-08-16 DIAGNOSIS — R0789 Other chest pain: Secondary | ICD-10-CM

## 2020-08-16 LAB — URINALYSIS, ROUTINE W REFLEX MICROSCOPIC
Bilirubin Urine: NEGATIVE
Glucose, UA: NEGATIVE mg/dL
Hgb urine dipstick: NEGATIVE
Ketones, ur: NEGATIVE mg/dL
Nitrite: POSITIVE — AB
Protein, ur: 30 mg/dL — AB
Specific Gravity, Urine: 1.019 (ref 1.005–1.030)
WBC, UA: >50 WBC/hpf — ABNORMAL HIGH (ref 0–5)
pH: 7 (ref 5.0–8.0)

## 2020-08-16 LAB — COMPREHENSIVE METABOLIC PANEL
ALT: 22 U/L (ref 0–44)
AST: 26 U/L (ref 15–41)
Albumin: 3.9 g/dL (ref 3.5–5.0)
Alkaline Phosphatase: 57 U/L (ref 38–126)
Anion gap: 8 (ref 5–15)
BUN: 19 mg/dL (ref 8–23)
CO2: 28 mmol/L (ref 22–32)
Calcium: 9 mg/dL (ref 8.9–10.3)
Chloride: 103 mmol/L (ref 98–111)
Creatinine, Ser: 0.73 mg/dL (ref 0.61–1.24)
GFR, Estimated: >60 mL/min (ref >60)
Glucose, Bld: 88 mg/dL (ref 70–99)
Potassium: 4.1 mmol/L (ref 3.5–5.1)
Sodium: 139 mmol/L (ref 135–145)
Total Bilirubin: 0.7 mg/dL (ref 0.3–1.2)
Total Protein: 7.2 g/dL (ref 6.5–8.1)

## 2020-08-16 LAB — CBC WITH DIFFERENTIAL/PLATELET
Abs Immature Granulocytes: 0.04 10*3/uL (ref 0.00–0.07)
Basophils Absolute: 0.1 10*3/uL (ref 0.0–0.1)
Basophils Relative: 1 %
Eosinophils Absolute: 0.3 10*3/uL (ref 0.0–0.5)
Eosinophils Relative: 3 %
HCT: 45.9 % (ref 39.0–52.0)
Hemoglobin: 15 g/dL (ref 13.0–17.0)
Immature Granulocytes: 1 %
Lymphocytes Relative: 31 %
Lymphs Abs: 2.6 10*3/uL (ref 0.7–4.0)
MCH: 32.5 pg (ref 26.0–34.0)
MCHC: 32.7 g/dL (ref 30.0–36.0)
MCV: 99.6 fL (ref 80.0–100.0)
Monocytes Absolute: 0.7 10*3/uL (ref 0.1–1.0)
Monocytes Relative: 8 %
Neutro Abs: 4.9 10*3/uL (ref 1.7–7.7)
Neutrophils Relative %: 56 %
Platelets: 158 10*3/uL (ref 150–400)
RBC: 4.61 MIL/uL (ref 4.22–5.81)
RDW: 12.3 % (ref 11.5–15.5)
WBC: 8.5 10*3/uL (ref 4.0–10.5)
nRBC: 0 % (ref 0.0–0.2)

## 2020-08-16 LAB — TROPONIN I (HIGH SENSITIVITY): Troponin I (High Sensitivity): 15 ng/L (ref <18)

## 2020-08-16 MED ORDER — CEPHALEXIN 500 MG PO CAPS
500.0000 mg | ORAL_CAPSULE | Freq: Once | ORAL | Status: AC
  Administered 2020-08-16: 500 mg via ORAL
  Filled 2020-08-16: qty 1

## 2020-08-16 MED ORDER — CEPHALEXIN 500 MG PO CAPS: 500.0000 mg | ORAL_CAPSULE | Freq: Four times a day (QID) | ORAL | 0 refills | Status: DC

## 2020-08-16 NOTE — ED Provider Notes (Signed)
[facility] EMERGENCY DEPARTMENT Provider Note   CSN: [id number] Arrival date & time: 08/16/20  0300     History Chief Complaint  Patient presents with   Chest Pain    Mario Massey is a 75 y.o. male.  Patient is a 75 year old male with history of bipolar disorder, hypertension presenting with complaints of chest discomfort.  Patient reports 3 episodes this evening of a sharp pain to the left of his sternum that has occurred this evening.  He describes a sharp pain lasting approximately 3 seconds that happened on 3 occasions 30 minutes apart.  He denies any shortness of breath, nausea, or diaphoresis.  The pain did not radiate to his arm or jaw.  He is currently pain-free and feels fine.  The history is provided by the patient.  Chest Pain Pain location:  L chest Pain quality: sharp   Pain radiates to:  Does not radiate Pain severity:  Moderate Onset quality:  Sudden Timing:  Intermittent     Past Medical History:  Diagnosis Date   Bipolar disorder (HCC)    Coronary artery disease    Depression    Hypertension     Patient Active Problem List   Diagnosis Date Noted   Stiffness of knee joint 01/17/2012   Difficulty in walking(719.7) 01/17/2012    Past Surgical History:  Procedure Laterality Date   KNEE SURGERY     with replacement on right knee       No family history on file.  Social History   Tobacco Use   Smoking status: Never   Smokeless tobacco: Never  Vaping Use   Vaping Use: Never used  Substance Use Topics   Alcohol use: Yes    Comment: occasional   Drug use: No    Home Medications Prior to Admission medications   Medication Sig Start Date End Date Taking? Authorizing Provider  aspirin 81 MG EC tablet Take 1 tablet by mouth daily. 12/12/05  Yes [provider]  clotrimazole (LOTRIMIN) 1 % cream Apply topically. 05/26/20  Yes [provider]  QUEtiapine (SEROQUEL) 25 MG tablet Take by mouth. 02/01/20  Yes [provider]  venlafaxine XR (EFFEXOR-XR) 150 MG 24 hr capsule Take 1 capsule by mouth daily. 01/10/20  Yes [provider]  aspirin 81 MG chewable tablet aspirin 81 mg chewable tablet  Chew 1 tablet every day by oral route.    [provider]  aspirin 81 MG tablet Take 81 mg by mouth daily.    [provider]  cephALEXin (KEFLEX) 500 MG capsule Take 1 capsule (500 mg total) by mouth 3 (three) times daily. 05/11/20   Geoffery Lyons, MD  clotrimazole (LOTRIMIN) 1 % cream Apply to affected area 2 times daily 05/30/19   Pollyann Savoy, MD  doxazosin (CARDURA) 8 MG tablet Take 8 mg by mouth at bedtime.    [provider]  DULoxetine (CYMBALTA) 20 MG capsule Take 20 mg by mouth daily.    [provider]  finasteride (PROSCAR) 5 MG tablet Take 5 mg by mouth daily.    [provider]  HYDROcodone-acetaminophen (NORCO/VICODIN) 5-325 MG tablet Take 1 tablet by mouth every 6 (six) hours as needed. 10/19/19   Bethann Berkshire, MD  lisinopril (PRINIVIL,ZESTRIL) 5 MG tablet Take 5 mg by mouth daily.    [provider]  nitroGLYCERIN (NITROSTAT) 0.4 MG SL tablet Place 0.4 mg under the tongue every 5 (five) minutes as needed for chest pain.  [provider]  tamsulosin (FLOMAX) 0.4 MG CAPS Take 0.4 mg by mouth daily.    [provider]  venlafaxine XR (EFFEXOR-XR) 150 MG 24 hr capsule Take 150 mg by mouth daily.    [provider]    Allergies    Patient has no known allergies.  Review of Systems   Review of Systems  Cardiovascular:  Positive for chest pain.  All other systems reviewed and are negative.  Physical Exam Updated Vital Signs Pulse (!) 45   Temp 98.9 F (37.2 C) (Oral)   Resp 20   SpO2 100%   Physical Exam Vitals and nursing note reviewed.  Constitutional:      General: He is not in acute distress.    Appearance: He is well-developed. He is not diaphoretic.  HENT:     Head: Normocephalic and  atraumatic.  Cardiovascular:     Rate and Rhythm: Normal rate and regular rhythm.     Heart sounds: No murmur heard.   No friction rub.  Pulmonary:     Effort: Pulmonary effort is normal. No respiratory distress.     Breath sounds: Normal breath sounds. No wheezing or rales.  Abdominal:     General: Bowel sounds are normal. There is no distension.     Palpations: Abdomen is soft.     Tenderness: There is no abdominal tenderness.  Musculoskeletal:        General: Normal range of motion.     Cervical back: Normal range of motion and neck supple.  Skin:    General: Skin is warm and dry.  Neurological:     Mental Status: He is alert and oriented to person, place, and time.     Coordination: Coordination normal.    ED Results / Procedures / Treatments   Labs (all labs ordered are listed, but only abnormal results are displayed) Labs Reviewed  URINALYSIS, ROUTINE W REFLEX MICROSCOPIC  COMPREHENSIVE METABOLIC PANEL  CBC WITH DIFFERENTIAL/PLATELET  TROPONIN I (HIGH SENSITIVITY)    EKG EKG Interpretation  Date/Time:  Wednesday August 16 2020 03:10:50 EDT Ventricular Rate:  48 PR Interval:  265 QRS Duration: 108 QT Interval:  478 QTC Calculation: 428 R Axis:   21 Text Interpretation: Sinus bradycardia Prolonged PR interval Low voltage, precordial leads Abnormal R-wave progression, early transition Borderline repolarization abnormality Baseline wander in lead(s) V3 Confirmed by Geoffery Lyons ([id number]) on 08/16/2020 3:14:44 AM  Radiology No results found.  Procedures Procedures   Medications Ordered in ED Medications - No data to display  ED Course  I have reviewed the triage vital signs and the nursing notes.  Pertinent labs & imaging results that were available during my care of the patient were reviewed by me and considered in my medical decision making (see chart for details).    MDM Rules/Calculators/A&P  Patient presenting here with complaints of chest pain which  seems noncardiac in etiology.  He reports 3 episodes of sharp pain to the left chest that lasted approximately 3 seconds each.  His troponin is negative and EKG is unchanged.  Patient has evidence of a urinary tract infection, but has an indwelling Foley catheter.  Antibiotics will be prescribed.  Final Clinical Impression(s) / ED Diagnoses Final diagnoses:  None    Rx / DC Orders ED Discharge Orders     None        Geoffery Lyons, MD 08/16/20 [phone]

## 2020-08-16 NOTE — ED Triage Notes (Addendum)
Intermittent sharp left sided anterior chest pain that started 2 hours pta, 7/10, no hx of same. Denies sob, nausea, or diaphoresis. Denies radiation. Pt reports intermittent bladder pain reminiscent of previous uti but less severe.

## 2020-08-16 NOTE — Discharge Instructions (Addendum)
Begin taking Keflex as prescribed.  Follow-up with your primary doctor if symptoms are not improving.

## 2020-09-04 ENCOUNTER — Encounter (HOSPITAL_COMMUNITY): Payer: Self-pay | Admitting: *Deleted

## 2020-09-04 ENCOUNTER — Other Ambulatory Visit: Payer: Self-pay

## 2020-09-04 ENCOUNTER — Emergency Department (HOSPITAL_COMMUNITY)
Admission: EM | Admit: 2020-09-04 | Discharge: 2020-09-04 | Disposition: A | Discharge status: Home / Self Care | Payer: No Typology Code available for payment source | Attending: Emergency Medicine | Admitting: Emergency Medicine

## 2020-09-04 DIAGNOSIS — Z466 Encounter for fitting and adjustment of urinary device: Secondary | ICD-10-CM | POA: Insufficient documentation

## 2020-09-04 DIAGNOSIS — Z79899 Other long term (current) drug therapy: Secondary | ICD-10-CM | POA: Diagnosis not present

## 2020-09-04 DIAGNOSIS — I10 Essential (primary) hypertension: Secondary | ICD-10-CM | POA: Diagnosis not present

## 2020-09-04 DIAGNOSIS — Z7982 Long term (current) use of aspirin: Secondary | ICD-10-CM | POA: Diagnosis not present

## 2020-09-04 DIAGNOSIS — I2510 Atherosclerotic heart disease of native coronary artery without angina pectoris: Secondary | ICD-10-CM | POA: Insufficient documentation

## 2020-09-04 NOTE — ED Triage Notes (Signed)
Pt states he is here to have his catheter changed

## 2020-09-04 NOTE — ED Provider Notes (Signed)
[facility] Provider Note   CSN: [id number] Arrival date & time: 09/04/20  1509     History Chief Complaint  Patient presents with   change catheter    [facility] is a 75 y.o. male.  75 year old male presents with request for replacement of his indwelling Foley catheter.  Patient states that the catheter has been in place for 2 months, knows it is supposed be changed monthly however is unable to get to his out-of-town urologist to have this done.  Obtains care through the [place] system and plans on following locally to arrange for future treatments.  Denies any complaints otherwise at this time.      Past Medical History:  Diagnosis Date   Bipolar disorder (HCC)    Coronary artery disease    Depression    Hypertension     Patient Active Problem List   Diagnosis Date Noted   Stiffness of knee joint 01/17/2012   Difficulty in walking(719.7) 01/17/2012    Past Surgical History:  Procedure Laterality Date   KNEE SURGERY     with replacement on right knee       History reviewed. No pertinent family history.  Social History   Tobacco Use   Smoking status: Never   Smokeless tobacco: Never  Vaping Use   Vaping Use: Never used  Substance Use Topics   Alcohol use: Yes    Comment: occasional   Drug use: No    Home Medications Prior to Admission medications   Medication Sig Start Date End Date Taking? Authorizing Provider  aspirin 81 MG chewable tablet aspirin 81 mg chewable tablet  Chew 1 tablet every day by oral route.    [provider]  aspirin 81 MG EC tablet Take 1 tablet by mouth daily. 12/12/05   [provider]  aspirin 81 MG tablet Take 81 mg by mouth daily.    [provider]  cephALEXin (KEFLEX) 500 MG capsule Take 1 capsule (500 mg total) by mouth 4 (four) times daily. 08/16/20   Geoffery Lyons, MD  clotrimazole (LOTRIMIN) 1 % cream Apply to affected area 2 times daily 05/30/19   Pollyann Savoy,  MD  clotrimazole (LOTRIMIN) 1 % cream Apply topically. 05/26/20   [provider]  doxazosin (CARDURA) 8 MG tablet Take 8 mg by mouth at bedtime.    [provider]  DULoxetine (CYMBALTA) 20 MG capsule Take 20 mg by mouth daily.    [provider]  finasteride (PROSCAR) 5 MG tablet Take 5 mg by mouth daily.    [provider]  HYDROcodone-acetaminophen (NORCO/VICODIN) 5-325 MG tablet Take 1 tablet by mouth every 6 (six) hours as needed. 10/19/19   Bethann Berkshire, MD  lisinopril (PRINIVIL,ZESTRIL) 5 MG tablet Take 5 mg by mouth daily.    [provider]  nitroGLYCERIN (NITROSTAT) 0.4 MG SL tablet Place 0.4 mg under the tongue every 5 (five) minutes as needed for chest pain.    [provider]  QUEtiapine (SEROQUEL) 25 MG tablet Take by mouth. 02/01/20   [provider]  tamsulosin (FLOMAX) 0.4 MG CAPS Take 0.4 mg by mouth daily.    [provider]  venlafaxine XR (EFFEXOR-XR) 150 MG 24 hr capsule Take 150 mg by mouth daily.    [provider]  venlafaxine XR (EFFEXOR-XR) 150 MG 24 hr capsule Take 1 capsule by mouth daily. 01/10/20   [provider]    Allergies    Patient has no known  allergies.  Review of Systems   Review of Systems  Constitutional:  Negative for fever.  Gastrointestinal:  Negative for abdominal pain, nausea and vomiting.  Genitourinary:  Negative for decreased urine volume, difficulty urinating and dysuria.  Skin:  Negative for rash and wound.  Allergic/Immunologic: Negative for immunocompromised state.  Neurological:  Negative for weakness.   Physical Exam Updated Vital Signs BP (!) 164/61 (BP Location: Right Arm)   Pulse (!) 47   Temp 98.5 F (36.9 C) (Oral)   Resp 20   Ht 6' (1.829 m)   Wt 120.2 kg   SpO2 96%   BMI 35.94 kg/m   Physical Exam Vitals and nursing note reviewed.  Constitutional:      General: He is not in acute distress.    Appearance: He is  well-developed. He is not diaphoretic.  HENT:     Head: Normocephalic and atraumatic.  Pulmonary:     Effort: Pulmonary effort is normal.  Abdominal:     Palpations: Abdomen is soft.     Tenderness: There is no abdominal tenderness.  Musculoskeletal:     Right lower leg: No edema.     Left lower leg: No edema.  Skin:    General: Skin is warm and dry.     Findings: No erythema or rash.  Neurological:     Mental Status: He is alert and oriented to person, place, and time.  Psychiatric:        Behavior: Behavior normal.    ED Results / Procedures / Treatments   Labs (all labs ordered are listed, but only abnormal results are displayed) Labs Reviewed - No data to display  EKG None  Radiology No results found.  Procedures Procedures   Medications Ordered in ED Medications - No data to display  ED Course  I have reviewed the triage vital signs and the nursing notes.  Pertinent labs & imaging results that were available during my care of the patient were reviewed by me and considered in my medical decision making (see chart for details).  Clinical Course as of 09/04/20 1945  Mon Sep 04, 2020  [id number] 75 year old male with indwelling Foley catheter presents for replacement.  States this catheter has been in place for 2 months and knows that should be replaced monthly.  Patient is going to work on arranging local follow-up for this however request exchange today.  No symptoms otherwise. [LM]    Clinical Course User Index [LM] Alden Hipp   MDM Rules/Calculators/A&P                            Final Clinical Impression(s) / ED Diagnoses Final diagnoses:  Encounter for Foley catheter replacement    Rx / DC Orders ED Discharge Orders     None        Jeannie Fend, PA-C 09/04/20 1945    Linwood Dibbles, MD 09/05/20 [id number]

## 2020-09-04 NOTE — Discharge Instructions (Signed)
Follow up with local urology clinic for care.

## 2020-09-04 NOTE — ED Notes (Addendum)
Pt refused foley change because "I'm tired of waiting so long. I will just see the man." Pt requesting discharge papers. Papers given pt refused vitals and to sign. Verbalized understanding of instructions. Ambulatory at time of discharge.

## 2020-09-16 ENCOUNTER — Other Ambulatory Visit: Payer: Self-pay

## 2020-09-16 ENCOUNTER — Emergency Department (HOSPITAL_COMMUNITY)
Admission: EM | Admit: 2020-09-16 | Discharge: 2020-09-16 | Disposition: A | Discharge status: Home / Self Care | Payer: No Typology Code available for payment source | Attending: Emergency Medicine | Admitting: Emergency Medicine

## 2020-09-16 ENCOUNTER — Encounter (HOSPITAL_COMMUNITY): Payer: Self-pay

## 2020-09-16 DIAGNOSIS — T839XXA Unspecified complication of genitourinary prosthetic device, implant and graft, initial encounter: Secondary | ICD-10-CM

## 2020-09-16 DIAGNOSIS — Z79899 Other long term (current) drug therapy: Secondary | ICD-10-CM | POA: Diagnosis not present

## 2020-09-16 DIAGNOSIS — I2510 Atherosclerotic heart disease of native coronary artery without angina pectoris: Secondary | ICD-10-CM | POA: Insufficient documentation

## 2020-09-16 DIAGNOSIS — T83098A Other mechanical complication of other indwelling urethral catheter, initial encounter: Secondary | ICD-10-CM | POA: Insufficient documentation

## 2020-09-16 DIAGNOSIS — I10 Essential (primary) hypertension: Secondary | ICD-10-CM | POA: Insufficient documentation

## 2020-09-16 DIAGNOSIS — Z7982 Long term (current) use of aspirin: Secondary | ICD-10-CM | POA: Insufficient documentation

## 2020-09-16 DIAGNOSIS — R109 Unspecified abdominal pain: Secondary | ICD-10-CM | POA: Diagnosis present

## 2020-09-16 LAB — URINALYSIS, ROUTINE W REFLEX MICROSCOPIC
Bilirubin Urine: NEGATIVE
Glucose, UA: NEGATIVE mg/dL
Ketones, ur: NEGATIVE mg/dL
Nitrite: NEGATIVE
Protein, ur: 100 mg/dL — AB
Specific Gravity, Urine: 1.015 (ref 1.005–1.030)
WBC, UA: >50 WBC/hpf — ABNORMAL HIGH (ref 0–5)
pH: 6 (ref 5.0–8.0)

## 2020-09-16 MED ORDER — CEPHALEXIN 500 MG PO CAPS: 500.0000 mg | ORAL_CAPSULE | Freq: Two times a day (BID) | ORAL | 0 refills | Status: AC

## 2020-09-16 NOTE — ED Triage Notes (Signed)
Pt. States they have a lot of pain in their groin. Pt. States they had their urinary catheter changed yesterday. Pt. States the pain began today.

## 2020-09-16 NOTE — ED Provider Notes (Signed)
[facility] EMERGENCY DEPARTMENT Provider Note   CSN: [id number] Arrival date & time: 09/16/20  1603     History Chief Complaint  Patient presents with   Groin Pain    Mario [facility]sey is a 75 y.o. male.  HPI  Patient significant sent medical history of bipolar, CAD, depression, hypertension, bladder spasms currently has a Foley presents with chief complaint of penile pain.  Patient states he was seen at his urology office yesterday, states that they placed in a new Foley, he states this morning he started to develop pain at the distal end of his penis, he states he feels as if he is having a UTI.  He denies pain in his suprapubic region, states he still filling up his bag he denies seeing any blood in his urine, denies seeing l drainage or discharge from his penis.  He denies testicular pain, denies recent trauma to the area, he denies  stomach pain, nausea, vomiting, diarrhea, denies fever or chills.  He has no other complaints at this time.  Does not endorse chest pain, shortness of breath, worsening pedal edema.  Past Medical History:  Diagnosis Date   Bipolar disorder (HCC)    Coronary artery disease    Depression    Hypertension     Patient Active Problem List   Diagnosis Date Noted   Stiffness of knee joint 01/17/2012   Difficulty in walking(719.7) 01/17/2012    Past Surgical History:  Procedure Laterality Date   KNEE SURGERY     with replacement on right knee       History reviewed. No pertinent family history.  Social History   Tobacco Use   Smoking status: Never   Smokeless tobacco: Never  Vaping Use   Vaping Use: Never used  Substance Use Topics   Alcohol use: Yes    Comment: occasional   Drug use: No    Home Medications Prior to Admission medications   Medication Sig Start Date End Date Taking? Authorizing Provider  cephALEXin (KEFLEX) 500 MG capsule Take 1 capsule (500 mg total) by mouth 2 (two) times daily for 7 days. 09/16/20 09/23/20  Yes Carroll Sage, PA-C  aspirin 81 MG chewable tablet aspirin 81 mg chewable tablet  Chew 1 tablet every day by oral route.    [provider]  aspirin 81 MG EC tablet Take 1 tablet by mouth daily. 12/12/05   [provider]  aspirin 81 MG tablet Take 81 mg by mouth daily.    [provider]  clotrimazole (LOTRIMIN) 1 % cream Apply to affected area 2 times daily 05/30/19   Pollyann Savoy, MD  clotrimazole (LOTRIMIN) 1 % cream Apply topically. 05/26/20   [provider]  doxazosin (CARDURA) 8 MG tablet Take 8 mg by mouth at bedtime.    [provider]  DULoxetine (CYMBALTA) 20 MG capsule Take 20 mg by mouth daily.    [provider]  finasteride (PROSCAR) 5 MG tablet Take 5 mg by mouth daily.    [provider]  HYDROcodone-acetaminophen (NORCO/VICODIN) 5-325 MG tablet Take 1 tablet by mouth every 6 (six) hours as needed. 10/19/19   Bethann Berkshire, MD  lisinopril (PRINIVIL,ZESTRIL) 5 MG tablet Take 5 mg by mouth daily.    [provider]  nitroGLYCERIN (NITROSTAT) 0.4 MG SL tablet Place 0.4 mg under the tongue every 5 (five) minutes as needed for chest pain.    [provider]  QUEtiapine (SEROQUEL) 25 MG tablet Take by mouth.  02/01/20   [provider]  tamsulosin (FLOMAX) 0.4 MG CAPS Take 0.4 mg by mouth daily.    [provider]  venlafaxine XR (EFFEXOR-XR) 150 MG 24 hr capsule Take 150 mg by mouth daily.    [provider]  venlafaxine XR (EFFEXOR-XR) 150 MG 24 hr capsule Take 1 capsule by mouth daily. 01/10/20   [provider]    Allergies    Patient has no known allergies.  Review of Systems   Review of Systems  Constitutional:  Negative for chills and fever.  HENT:  Negative for congestion.   Respiratory:  Negative for shortness of breath.   Cardiovascular:  Negative for chest pain.  Gastrointestinal:  Negative for abdominal pain, diarrhea, nausea and vomiting.   Genitourinary:  Positive for dysuria and penile pain. Negative for enuresis, flank pain, penile discharge, penile swelling, scrotal swelling and testicular pain.  Musculoskeletal:  Negative for back pain.  Skin:  Negative for rash.  Neurological:  Negative for dizziness.  Hematological:  Does not bruise/bleed easily.   Physical Exam Updated Vital Signs BP 140/70 (BP Location: Right Arm)   Pulse (!) 48   Temp 98.2 F (36.8 C) (Oral)   Resp 20   Ht 6' (1.829 m)   Wt 116.9 kg   SpO2 100%   BMI 34.95 kg/m   Physical Exam Vitals and nursing note reviewed. Exam conducted with a chaperone present.  Constitutional:      General: He is not in acute distress.    Appearance: He is not ill-appearing.  HENT:     Head: Normocephalic and atraumatic.     Nose: No congestion.  Eyes:     Conjunctiva/sclera: Conjunctivae normal.  Cardiovascular:     Rate and Rhythm: Normal rate and regular rhythm.     Pulses: Normal pulses.     Heart sounds: No murmur heard.   No friction rub. No gallop.  Pulmonary:     Effort: No respiratory distress.     Breath sounds: No wheezing, rhonchi or rales.  Abdominal:     Palpations: Abdomen is soft.     Tenderness: There is no abdominal tenderness. There is no right CVA tenderness or left CVA tenderness.     Comments: Abdomen nondistended normal bowel sounds, dull to percussion, abdomen nontender to palpation, no suprapubic pain, no mass, no flank pain, no rebound tenderness, peritoneal sign, guarding, no flank tenderness.  Genitourinary:    Comments: With chaperone present genital exam was performed Foley catheter was in place, there is some drainage coming from the distal end of his penis, no surrounding erythema, testicles were nontender to palpation.  Skin:    General: Skin is warm and dry.  Neurological:     Mental Status: He is alert.  Psychiatric:        Mood and Affect: Mood normal.    ED Results / Procedures / Treatments   Labs (all labs  ordered are listed, but only abnormal results are displayed) Labs Reviewed  URINALYSIS, ROUTINE W REFLEX MICROSCOPIC - Abnormal; Notable for the following components:      Result Value   APPearance CLOUDY (*)    Hgb urine dipstick SMALL (*)    Protein, ur 100 (*)    Leukocytes,Ua LARGE (*)    WBC, UA >50 (*)    Bacteria, UA RARE (*)    All other components within normal limits  URINE CULTURE    EKG None  Radiology No results found.  Procedures Procedures  Medications Ordered in ED Medications - No data to display  ED Course  I have reviewed the triage vital signs and the nursing notes.  Pertinent labs & imaging results that were available during my care of the patient were reviewed by me and considered in my medical decision making (see chart for details).    MDM Rules/Calculators/A&P                          Initial impression-patient presents emerged part chief complaint of penile pain.  He is alert, does not appear in acute stress, vital signs reassuring.  Concern for possible obstruction will obtain bladder ultrasound, and reevaluate.  Work-up-bladder scan shows 25 mL, UA shows large leukocytes, red blood cells, many white blood cells, rare bacteria.  Rule out-I have low suspicion for postrenal obstruction as he only has 25 mL seen on bladder scan, Foley is actively draining at this time, abdomen is nontender to palpation.   low suspicion for pyelonephritis or kidney stones as he has no flank tenderness, presentation is atypical,  nontoxic-appearing, vital signs are reassuring.  UA does show red blood cells but I suspect this is likely due to the indwelling catheter which will cause irritation.  I have low suspicion for abdominal dissection as he has no back pain, etiology is atypical.  Low suspicion for testicular torsion as, testicles are nontender to palpation.  It is noted that patient has bradycardia but this appears to be his baseline from prior visits, he also does  not endorse chest pain, shortness of breath, no signs of hypoperfusion fluid overload noted on my exam.  Plan-  Penile pain- possible patient is having from UTI  versus irritation from newly placed catheter.  Will culture urine, start on antibiotics as he is endorsing pain, have him follow-up with urology for further evaluation.    Vital signs have remained stable, no indication for hospital admission.  Patient discussed with attending and they agreed with assessment and plan.  Patient given at home care as well strict return precautions.  Patient verbalized that they understood agreed to said plan.  Final Clinical Impression(s) / ED Diagnoses Final diagnoses:  Foley catheter problem, initial encounter [facility])    Rx / DC Orders ED Discharge Orders          Ordered    cephALEXin (KEFLEX) 500 MG capsule  2 times daily        09/16/20 1953             Barnie Del 09/16/20 Trenton Founds, MD 09/20/20 2003

## 2020-09-16 NOTE — Discharge Instructions (Addendum)
UA is concerning for possible UTI, starting you on antibiotics please take as prescribed.  Please follow-up with your urologist for further evaluation.  Come back to the emergency department if you develop chest pain, shortness of breath, severe abdominal pain, uncontrolled nausea, vomiting, diarrhea.

## 2020-09-19 ENCOUNTER — Other Ambulatory Visit: Payer: Self-pay

## 2020-09-19 ENCOUNTER — Emergency Department (HOSPITAL_COMMUNITY)
Admission: EM | Admit: 2020-09-19 | Discharge: 2020-09-19 | Disposition: A | Discharge status: Home / Self Care | Payer: No Typology Code available for payment source | Attending: Emergency Medicine | Admitting: Emergency Medicine

## 2020-09-19 DIAGNOSIS — T839XXA Unspecified complication of genitourinary prosthetic device, implant and graft, initial encounter: Secondary | ICD-10-CM

## 2020-09-19 DIAGNOSIS — T83091A Other mechanical complication of indwelling urethral catheter, initial encounter: Secondary | ICD-10-CM | POA: Insufficient documentation

## 2020-09-19 DIAGNOSIS — Z7982 Long term (current) use of aspirin: Secondary | ICD-10-CM | POA: Diagnosis not present

## 2020-09-19 DIAGNOSIS — Z79899 Other long term (current) drug therapy: Secondary | ICD-10-CM | POA: Diagnosis not present

## 2020-09-19 DIAGNOSIS — Y658 Other specified misadventures during surgical and medical care: Secondary | ICD-10-CM | POA: Insufficient documentation

## 2020-09-19 DIAGNOSIS — I10 Essential (primary) hypertension: Secondary | ICD-10-CM | POA: Insufficient documentation

## 2020-09-19 DIAGNOSIS — I2510 Atherosclerotic heart disease of native coronary artery without angina pectoris: Secondary | ICD-10-CM | POA: Diagnosis not present

## 2020-09-19 LAB — URINE CULTURE: Culture: >=100000 — AB

## 2020-09-19 NOTE — Discharge Instructions (Addendum)
Please continue with the antibiotics that I prescribed you from 3 days ago.  Continue all home medication as prescribed.  Please follow-up with your primary care doctor and/or your urologist for reevaluation of your urine.  Please follow-up within the next 5 days.  Come back to the emergency department if you develop chest pain, shortness of breath, severe abdominal pain, uncontrolled nausea, vomiting, diarrhea.

## 2020-09-19 NOTE — ED Notes (Signed)
Initial catheter bag was bent and catheter was kinked. Straightened out bag and catheter. Output 500 cc of urine.

## 2020-09-19 NOTE — ED Provider Notes (Signed)
[facility] EMERGENCY DEPARTMENT Provider Note   CSN: [id number] Arrival date & time: 09/19/20  1138     History Chief Complaint  Patient presents with   Urinary Retention    Mario [facility]sey is a 75 y.o. male.  HPI  Patient with significant medical history of bipolar, CAD, depression, hypertension, bladder spasms currently with Foley catheter in place presents with chief complaint of a Foley issue.  Patient states he was having suprapubic pain, and was unable to drain his catheter.  States it happened earlier today, he denies fevers, chills, abdominal pain, flank pain, nausea, vomiting, diarrhea, he denies penile discharge, testicular pain.  He has no other complaints at this time.  Does not endorse chest pain, shortness of breath, worsening pedal edema.  after  reviewing the patient's chart patient was seen here 3 days ago, for similar complaint, appears  his catheter was kinked, once it was unkinked urine was flowing without difficulty. UA obtained concern for possible UT, culture obtained currently in Keflex which is sensitive to this.  Past Medical History:  Diagnosis Date   Bipolar disorder (HCC)    Coronary artery disease    Depression    Hypertension     Patient Active Problem List   Diagnosis Date Noted   Stiffness of knee joint 01/17/2012   Difficulty in walking(719.7) 01/17/2012    Past Surgical History:  Procedure Laterality Date   KNEE SURGERY     with replacement on right knee       No family history on file.  Social History   Tobacco Use   Smoking status: Never   Smokeless tobacco: Never  Vaping Use   Vaping Use: Never used  Substance Use Topics   Alcohol use: Yes    Comment: occasional   Drug use: No    Home Medications Prior to Admission medications   Medication Sig Start Date End Date Taking? Authorizing Provider  aspirin 81 MG chewable tablet aspirin 81 mg chewable tablet  Chew 1 tablet every day by oral route.    [provider]  aspirin 81 MG EC tablet Take 1 tablet by mouth daily. 12/12/05   [provider]  aspirin 81 MG tablet Take 81 mg by mouth daily.    [provider]  cephALEXin (KEFLEX) 500 MG capsule Take 1 capsule (500 mg total) by mouth 2 (two) times daily for 7 days. 09/16/20 09/23/20  Carroll Sage, PA-C  clotrimazole (LOTRIMIN) 1 % cream Apply to affected area 2 times daily 05/30/19   Pollyann Savoy, MD  clotrimazole (LOTRIMIN) 1 % cream Apply topically. 05/26/20   [provider]  doxazosin (CARDURA) 8 MG tablet Take 8 mg by mouth at bedtime.    [provider]  DULoxetine (CYMBALTA) 20 MG capsule Take 20 mg by mouth daily.    [provider]  finasteride (PROSCAR) 5 MG tablet Take 5 mg by mouth daily.    [provider]  HYDROcodone-acetaminophen (NORCO/VICODIN) 5-325 MG tablet Take 1 tablet by mouth every 6 (six) hours as needed. 10/19/19   Bethann Berkshire, MD  lisinopril (PRINIVIL,ZESTRIL) 5 MG tablet Take 5 mg by mouth daily.    [provider]  nitroGLYCERIN (NITROSTAT) 0.4 MG SL tablet Place 0.4 mg under the tongue every 5 (five) minutes as needed for chest pain.    [provider]  QUEtiapine (SEROQUEL) 25 MG tablet Take by mouth. 02/01/20   [provider]  tamsulosin (FLOMAX) 0.4 MG CAPS Take  0.4 mg by mouth daily.    [provider]  venlafaxine XR (EFFEXOR-XR) 150 MG 24 hr capsule Take 150 mg by mouth daily.    [provider]  venlafaxine XR (EFFEXOR-XR) 150 MG 24 hr capsule Take 1 capsule by mouth daily. 01/10/20   [provider]    Allergies    Patient has no known allergies.  Review of Systems   Review of Systems  Constitutional:  Negative for chills and fever.  HENT:  Negative for congestion.   Respiratory:  Negative for shortness of breath.   Cardiovascular:  Negative for chest pain.  Gastrointestinal:  Negative for abdominal pain.  Genitourinary:  Positive  for difficulty urinating. Negative for enuresis and flank pain.  Musculoskeletal:  Negative for back pain.  Skin:  Negative for rash.  Neurological:  Negative for headaches.  Hematological:  Does not bruise/bleed easily.   Physical Exam Updated Vital Signs BP (!) 147/55   Pulse (!) 43   Temp 98.2 F (36.8 C) (Oral)   Resp 13   SpO2 97%   Physical Exam Vitals and nursing note reviewed.  Constitutional:      General: He is not in acute distress.    Appearance: He is not ill-appearing.  HENT:     Head: Normocephalic and atraumatic.     Nose: No congestion.  Eyes:     Conjunctiva/sclera: Conjunctivae normal.  Cardiovascular:     Rate and Rhythm: Normal rate and regular rhythm.     Pulses: Normal pulses.     Heart sounds: No murmur heard.   No friction rub. No gallop.  Pulmonary:     Effort: No respiratory distress.     Breath sounds: No wheezing, rhonchi or rales.  Abdominal:     Palpations: Abdomen is soft.     Tenderness: There is no abdominal tenderness. There is no right CVA tenderness or left CVA tenderness.     Comments: Patient was assessed after nursing staff unkinked Foley, abdomen nondistended normal bowel sounds, dull to percussion.  Abdomen was nontender to palpation, no guarding, rebound tenderness, peritoneal sign, no flank tenderness.  Genitourinary:    Comments: Foley catheter was visualized appears to be draining well, after catheter was unkinked 500 cc of urine flushed out. Skin:    General: Skin is warm and dry.  Neurological:     Mental Status: He is alert.  Psychiatric:        Mood and Affect: Mood normal.    ED Results / Procedures / Treatments   Labs (all labs ordered are listed, but only abnormal results are displayed) Labs Reviewed - No data to display  EKG None  Radiology No results found.  Procedures Procedures   Medications Ordered in ED Medications - No data to display  ED Course  I have reviewed the triage vital signs and the  nursing notes.  Pertinent labs & imaging results that were available during my care of the patient were reviewed by me and considered in my medical decision making (see chart for details).    MDM Rules/Calculators/A&P                          Initial impression-patient presents with Foley catheter issue.  He is alert, does not appear acute chest, vital signs reassuring.  Work-up-bladder scan obtained after unkinking catheter has less than 12 mL of fluid.  Rule out-low suspicion for systemic infection as patient is nontoxic-appearing, vital signs reassuring.  Low suspicion for Pilo or kidney stone as patient denies flank pain, no CVA tenderness, presentation atypical of etiology.  Patient does have bradycardia this appears to be his baseline, he does not endorse chest pain or shortness of breath, he is hemodynamically stable, no acute abnormality seen on EKG.  Plan-  Foley catheter issue resolved-suspect patient had a obstruction likely due to the kink, this was addressed, urine is flowing well, he has no complaints this time.  will have him continue the antibiotics prescribed to him, follow-up with urology for further evaluation.  Vital signs have remained stable, no indication for hospital admission.  Patient discussed with attending and they agreed with assessment and plan.  Patient given at home care as well strict return precautions.  Patient verbalized that they understood agreed to said plan.  Final Clinical Impression(s) / ED Diagnoses Final diagnoses:  Complication of Foley catheter, initial encounter [facility])    Rx / DC Orders ED Discharge Orders     None        Carroll Sage, PA-C 09/19/20 1411    Sloan Leiter, DO 09/19/20 1754

## 2020-09-20 ENCOUNTER — Telehealth: Payer: Self-pay | Admitting: *Deleted

## 2020-09-20 NOTE — Telephone Encounter (Signed)
Post ED Visit - Positive Culture Follow-up  Culture report reviewed by antimicrobial stewardship pharmacist: [facility] []  , Pharm.D. []  Enzo Bi, Pharm.D., BCPS AQ-ID []  , Pharm.D., BCPS []  Celedonio Miyamoto, Pharm.D., BCPS []  [place], Garvin Fila.D., BCPS, AAHIVP []  , Pharm.D., BCPS, AAHIVP []  Georgina Pillion, PharmD, BCPS []  , PharmD, BCPS []  [place], PharmD, BCPS []  [place], PharmD []  , PharmD, BCPS []  Estella Husk, PharmD  Pharmacy Team []  Lysle Pearl, PharmD []  , PharmD []  Phillips Climes, PharmD []  , Rph []  Agapito Games) , PharmD []  Verlan Friends, PharmD []  , PharmD []  Mervyn Gay, PharmD []  , PharmD []  Vinnie Level, PharmD []  [facility], PharmD []  , PharmD []  Len Childs, PharmD   Positive urine culture Treated with Cephalexin, organism sensitive to the same and no further patient follow-up is required at this time.  Dr.  [name] 09/20/2020, 11:18 AM

## 2020-11-07 ENCOUNTER — Encounter (HOSPITAL_COMMUNITY): Payer: Self-pay | Admitting: *Deleted

## 2020-11-07 ENCOUNTER — Other Ambulatory Visit: Payer: Self-pay

## 2020-11-07 ENCOUNTER — Emergency Department (HOSPITAL_COMMUNITY)
Admission: EM | Admit: 2020-11-07 | Discharge: 2020-11-07 | Disposition: A | Discharge status: Home / Self Care | Payer: No Typology Code available for payment source | Attending: Emergency Medicine | Admitting: Emergency Medicine

## 2020-11-07 DIAGNOSIS — I119 Hypertensive heart disease without heart failure: Secondary | ICD-10-CM | POA: Diagnosis not present

## 2020-11-07 DIAGNOSIS — T839XXA Unspecified complication of genitourinary prosthetic device, implant and graft, initial encounter: Secondary | ICD-10-CM

## 2020-11-07 DIAGNOSIS — R300 Dysuria: Secondary | ICD-10-CM | POA: Diagnosis present

## 2020-11-07 DIAGNOSIS — R339 Retention of urine, unspecified: Secondary | ICD-10-CM | POA: Insufficient documentation

## 2020-11-07 DIAGNOSIS — T83091A Other mechanical complication of indwelling urethral catheter, initial encounter: Secondary | ICD-10-CM | POA: Diagnosis not present

## 2020-11-07 DIAGNOSIS — Z79899 Other long term (current) drug therapy: Secondary | ICD-10-CM | POA: Diagnosis not present

## 2020-11-07 DIAGNOSIS — Y733 Surgical instruments, materials and gastroenterology and urology devices (including sutures) associated with adverse incidents: Secondary | ICD-10-CM | POA: Insufficient documentation

## 2020-11-07 DIAGNOSIS — I2510 Atherosclerotic heart disease of native coronary artery without angina pectoris: Secondary | ICD-10-CM | POA: Insufficient documentation

## 2020-11-07 DIAGNOSIS — Z7982 Long term (current) use of aspirin: Secondary | ICD-10-CM | POA: Diagnosis not present

## 2020-11-07 LAB — URINALYSIS, ROUTINE W REFLEX MICROSCOPIC
Bilirubin Urine: NEGATIVE
Glucose, UA: NEGATIVE mg/dL
Hgb urine dipstick: NEGATIVE
Ketones, ur: NEGATIVE mg/dL
Nitrite: NEGATIVE
Protein, ur: NEGATIVE mg/dL
Specific Gravity, Urine: 1.014 (ref 1.005–1.030)
pH: 7 (ref 5.0–8.0)

## 2020-11-07 MED ORDER — CEPHALEXIN 500 MG PO CAPS: 500.0000 mg | ORAL_CAPSULE | Freq: Four times a day (QID) | ORAL | 0 refills | Status: DC

## 2020-11-07 MED ORDER — LIDOCAINE HCL URETHRAL/MUCOSAL 2 % EX GEL
1.0000 "application " | Freq: Once | CUTANEOUS | Status: AC
  Administered 2020-11-07: 1 via URETHRAL
  Filled 2020-11-07: qty 10

## 2020-11-07 MED ORDER — CEPHALEXIN 500 MG PO CAPS: 500.0000 mg | ORAL_CAPSULE | Freq: Once | ORAL | Status: DC

## 2020-11-07 MED ORDER — ACETAMINOPHEN 500 MG PO TABS: 1000.0000 mg | ORAL_TABLET | Freq: Once | ORAL | Status: DC

## 2020-11-07 NOTE — Discharge Instructions (Addendum)
Follow-up with your family doctor next week for recheck. 

## 2020-11-07 NOTE — ED Triage Notes (Signed)
Urinary retention onset 10am today, has indwelling catheter

## 2020-11-07 NOTE — ED Provider Notes (Signed)
[facility] Provider Note   CSN: [id number] Arrival date & time: 11/07/20  1204     History Chief Complaint  Patient presents with   Urinary Retention    Mario [facility]sey is a 75 y.o. male.  Pt presents to the ED today with foley catheter issues.  Pt said his catheter is leaking around his foley.  He is also having some burning.  Pt denies f/c.      Past Medical History:  Diagnosis Date   Bipolar disorder (HCC)    Coronary artery disease    Depression    Hypertension     Patient Active Problem List   Diagnosis Date Noted   Stiffness of knee joint 01/17/2012   Difficulty in walking(719.7) 01/17/2012    Past Surgical History:  Procedure Laterality Date   KNEE SURGERY     with replacement on right knee       No family history on file.  Social History   Tobacco Use   Smoking status: Never   Smokeless tobacco: Never  Vaping Use   Vaping Use: Never used  Substance Use Topics   Alcohol use: Yes    Comment: occasional   Drug use: No    Home Medications Prior to Admission medications   Medication Sig Start Date End Date Taking? Authorizing Provider  guaifenesin (HUMIBID E) 400 MG TABS tablet Take 1 tablet by mouth 3 (three) times daily as needed. 11/06/20  Yes [provider]  Multiple Vitamin (MULTIVITAMIN) capsule Take 1 tablet by mouth daily. 03/19/05  Yes [provider]  vitamin E 180 MG (400 UNITS) capsule Take 1 tablet by mouth daily. 03/19/05  Yes [provider]  ziprasidone (GEODON) 20 MG capsule TAKE ONE CAPSULE BY MOUTH DAILY (TAKE WITH FOOD) FOR BIPOLAR 01/10/20  Yes [provider]  aspirin 81 MG chewable tablet aspirin 81 mg chewable tablet  Chew 1 tablet every day by oral route.    [provider]  aspirin 81 MG EC tablet Take 1 tablet by mouth daily. 12/12/05   [provider]  aspirin 81 MG tablet Take 81 mg by mouth daily.    [provider]  clotrimazole  (LOTRIMIN) 1 % cream Apply to affected area 2 times daily 05/30/19   Mario Savoy, MD  clotrimazole (LOTRIMIN) 1 % cream Apply topically. 05/26/20   [provider]  doxazosin (CARDURA) 8 MG tablet Take 8 mg by mouth at bedtime.    [provider]  DULoxetine (CYMBALTA) 20 MG capsule Take 20 mg by mouth daily.    [provider]  finasteride (PROSCAR) 5 MG tablet Take 5 mg by mouth daily.    [provider]  HYDROcodone-acetaminophen (NORCO/VICODIN) 5-325 MG tablet Take 1 tablet by mouth every 6 (six) hours as needed. 10/19/19   Mario Berkshire, MD  lisinopril (PRINIVIL,ZESTRIL) 5 MG tablet Take 5 mg by mouth daily.    [provider]  nitroGLYCERIN (NITROSTAT) 0.4 MG SL tablet Place 0.4 mg under the tongue every 5 (five) minutes as needed for chest pain.    [provider]  QUEtiapine (SEROQUEL) 25 MG tablet Take by mouth. 02/01/20   [provider]  tamsulosin (FLOMAX) 0.4 MG CAPS Take 0.4 mg by mouth daily.    [provider]  venlafaxine XR (EFFEXOR-XR) 150 MG 24 hr capsule Take 150 mg by mouth daily.    [provider]  venlafaxine XR (EFFEXOR-XR) 150 MG 24 hr capsule Take 1 capsule  by mouth daily. 01/10/20   [provider]    Allergies    Patient has no known allergies.  Review of Systems   Review of Systems  Genitourinary:  Positive for dysuria.   Physical Exam Updated Vital Signs BP (!) 183/85 (BP Location: Right Arm)   Pulse (!) 53   Temp 98 F (36.7 C) (Oral)   Resp 18   SpO2 97%   Physical Exam Vitals and nursing note reviewed.  Constitutional:      Appearance: Normal appearance.  HENT:     Head: Normocephalic and atraumatic.     Right Ear: External ear normal.     Left Ear: External ear normal.     Nose: Nose normal.     Mouth/Throat:     Mouth: Mucous membranes are moist.     Pharynx: Oropharynx is clear.  Eyes:     Extraocular Movements: Extraocular movements intact.      Conjunctiva/sclera: Conjunctivae normal.     Pupils: Pupils are equal, round, and reactive to light.  Cardiovascular:     Rate and Rhythm: Normal rate and regular rhythm.     Pulses: Normal pulses.     Heart sounds: Normal heart sounds.  Pulmonary:     Effort: Pulmonary effort is normal.     Breath sounds: Normal breath sounds.  Abdominal:     General: Abdomen is flat. Bowel sounds are normal.     Palpations: Abdomen is soft.  Musculoskeletal:        General: Normal range of motion.     Cervical back: Normal range of motion and neck supple.  Skin:    General: Skin is warm.     Capillary Refill: Capillary refill takes less than 2 seconds.  Neurological:     General: No focal deficit present.     Mental Status: He is alert and oriented to person, place, and time.  Psychiatric:        Mood and Affect: Mood normal.        Behavior: Behavior normal.    ED Results / Procedures / Treatments   Labs (all labs ordered are listed, but only abnormal results are displayed) Labs Reviewed  URINE CULTURE  URINALYSIS, ROUTINE W REFLEX MICROSCOPIC    EKG None  Radiology No results found.  Procedures Procedures   Medications Ordered in ED Medications  acetaminophen (TYLENOL) tablet 1,000 mg (1,000 mg Oral Not Given 11/07/20 1516)  lidocaine (XYLOCAINE) 2 % jelly 1 application (1 application Urethral Given 11/07/20 1459)    ED Course  I have reviewed the triage vital signs and the nursing notes.  Pertinent labs & imaging results that were available during my care of the patient were reviewed by me and considered in my medical decision making (see chart for details).    MDM Rules/Calculators/A&P                           Foley replaced.  It is now draining well.  UA pending.  Final Clinical Impression(s) / ED Diagnoses Final diagnoses:  Complication of Foley catheter, initial encounter [facility])    Rx / DC Orders ED Discharge Orders     None        Jacalyn Lefevre,  MD 11/07/20 1536

## 2020-11-10 LAB — URINE CULTURE: Culture: >=100000 — AB

## 2021-02-03 ENCOUNTER — Other Ambulatory Visit: Payer: Self-pay

## 2021-02-03 ENCOUNTER — Emergency Department (HOSPITAL_COMMUNITY)
Admission: EM | Admit: 2021-02-03 | Discharge: 2021-02-03 | Disposition: A | Discharge status: Home / Self Care | Payer: No Typology Code available for payment source | Attending: Emergency Medicine | Admitting: Emergency Medicine

## 2021-02-03 ENCOUNTER — Encounter (HOSPITAL_COMMUNITY): Payer: Self-pay

## 2021-02-03 DIAGNOSIS — I2510 Atherosclerotic heart disease of native coronary artery without angina pectoris: Secondary | ICD-10-CM | POA: Diagnosis not present

## 2021-02-03 DIAGNOSIS — T83098A Other mechanical complication of other indwelling urethral catheter, initial encounter: Secondary | ICD-10-CM | POA: Insufficient documentation

## 2021-02-03 DIAGNOSIS — Z466 Encounter for fitting and adjustment of urinary device: Secondary | ICD-10-CM

## 2021-02-03 DIAGNOSIS — Z7982 Long term (current) use of aspirin: Secondary | ICD-10-CM | POA: Diagnosis not present

## 2021-02-03 DIAGNOSIS — I10 Essential (primary) hypertension: Secondary | ICD-10-CM | POA: Diagnosis not present

## 2021-02-03 DIAGNOSIS — Y732 Prosthetic and other implants, materials and accessory gastroenterology and urology devices associated with adverse incidents: Secondary | ICD-10-CM | POA: Diagnosis not present

## 2021-02-03 NOTE — ED Triage Notes (Signed)
Pt arrived via POV  c/o groin pain from chronic catheter use. Pt reports being followed by Kathryne Sharper, [place] but has not been able to see them for the pain. Pt reports Hx of UTI's associated with his catheter use.

## 2021-02-03 NOTE — ED Provider Notes (Signed)
[facility] EMERGENCY DEPARTMENT Provider Note   CSN: [id number] Arrival date & time: 02/03/21  0253     History  Chief Complaint  Patient presents with   Groin Pain    Mario [facility] is a 76 y.o. male.  The history is provided by the patient.  Groin Pain He has history of hypertension, bipolar disorder, coronary artery disease and has a Foley catheter which has been in place for the last 3 months.  He states that the catheter was last changed by his urologist at the [facility] in October.  He is complaining of pain in his penis.  He denies abdominal pain, nausea, vomiting.  He denies fever or chills.  There has been no hematuria.   Home Medications Prior to Admission medications   Medication Sig Start Date End Date Taking? Authorizing Provider  aspirin 81 MG chewable tablet aspirin 81 mg chewable tablet  Chew 1 tablet every day by oral route. Patient not taking: No sig reported    [provider]  aspirin 81 MG EC tablet Take 1 tablet by mouth daily. Patient not taking: No sig reported 12/12/05   [provider]  aspirin 81 MG tablet Take 81 mg by mouth daily. Patient not taking: No sig reported    [provider]  cephALEXin (KEFLEX) 500 MG capsule Take 1 capsule (500 mg total) by mouth 4 (four) times daily. 11/07/20   Milton Ferguson, MD  clotrimazole (LOTRIMIN) 1 % cream Apply to affected area 2 times daily 05/30/19   Truddie Hidden, MD  clotrimazole (LOTRIMIN) 1 % cream Apply topically. Patient not taking: No sig reported 05/26/20   [provider]  doxazosin (CARDURA) 8 MG tablet Take 8 mg by mouth at bedtime. Patient not taking: No sig reported    [provider]  DULoxetine (CYMBALTA) 20 MG capsule Take 20 mg by mouth daily. Patient not taking: No sig reported    [provider]  finasteride (PROSCAR) 5 MG tablet Take 5 mg by mouth daily. Patient not taking: No sig reported    [provider]  guaifenesin (HUMIBID E) 400 MG TABS tablet Take 1 tablet by mouth 3 (three) times daily as needed. Patient not taking: No sig reported 11/06/20   [provider]  HYDROcodone-acetaminophen (NORCO/VICODIN) 5-325 MG tablet Take 1 tablet by mouth every 6 (six) hours as needed. Patient not taking: No sig reported 10/19/19   Milton Ferguson, MD  lisinopril (PRINIVIL,ZESTRIL) 5 MG tablet Take 5 mg by mouth daily.    [provider]  Multiple Vitamin (MULTIVITAMIN) capsule Take 1 tablet by mouth daily. Patient not taking: No sig reported 03/19/05   [provider]  nitroGLYCERIN (NITROSTAT) 0.4 MG SL tablet Place 0.4 mg under the tongue every 5 (five) minutes as needed for chest pain.    [provider]  QUEtiapine (SEROQUEL) 25 MG tablet Take by mouth. Patient not taking: No sig reported 02/01/20   [provider]  tamsulosin (FLOMAX) 0.4 MG CAPS Take 0.4 mg by mouth daily.    [provider]  venlafaxine XR (EFFEXOR-XR) 150 MG 24 hr capsule Take 150 mg by mouth daily.    [provider]  venlafaxine XR (EFFEXOR-XR) 150 MG 24 hr capsule Take 1 capsule by mouth daily. Patient not taking: No sig reported 01/10/20   [provider]  vitamin E 180 MG (400 UNITS) capsule Take 1 tablet by mouth daily. Patient not taking: No sig reported 03/19/05  [provider]  ziprasidone (GEODON) 20 MG capsule Take 20 mg by mouth 2 (two) times daily with a meal. 01/10/20   [provider]      Allergies    Patient has no known allergies.    Review of Systems   Review of Systems  All other systems reviewed and are negative.  Physical Exam Updated Vital Signs BP (!) 178/90    Pulse 62    Temp 98.5 F (36.9 C) (Oral)    Resp 18    Ht 6' (1.829 m)    Wt 117 kg    SpO2 92%    BMI 34.98 kg/m  Physical Exam Vitals and nursing note reviewed.  76 year old male, resting comfortably and in no acute distress. Vital  signs are significant for elevated blood pressure. Oxygen saturation is 92%, which is normal. Head is normocephalic and atraumatic. PERRLA, EOMI. Oropharynx is clear. Neck is nontender and supple without adenopathy or JVD. Back is nontender and there is no CVA tenderness. Lungs are clear without rales, wheezes, or rhonchi. Chest is nontender. Heart has regular rate and rhythm without murmur. Abdomen is soft, flat, nontender. Genitalia: Uncircumcised penis with Foley catheter in place.  No penile irritation noted. Extremities have no cyanosis or edema, full range of motion is present. Skin is warm and dry without rash. Neurologic: Mental status is normal, cranial nerves are intact, moves all extremities equally.  ED Results / Procedures / Treatments    Procedures Procedures    Medications Ordered in ED Medications - No data to display  ED Course/ Medical Decision Making/ A&P                           Medical Decision Making  Penile pain in the setting of indwelling Foley catheter.  The penis appears normal.  Urine drainage appears clear.  Old records are reviewed confirming Foley catheter which has been in place since October, but it appears that has had a urinary catheter since at least March 2021, several ED visits for catheter replacement.  Catheter is replaced today.  He does not clinically have a UTI, so urinalysis and culture were not sent.        Final Clinical Impression(s) / ED Diagnoses Final diagnoses:  Encounter for replacement of urinary catheter    Rx / DC Orders ED Discharge Orders     None         Delora Fuel, MD [id number] [phone]

## 2021-02-03 NOTE — ED Notes (Signed)
Pt provided Leg Bag and Leg Straps for foley drainage bag and discharge home. Pt ambulatory without assistance.

## 2021-02-03 NOTE — Discharge Instructions (Addendum)
Please follow-up with your care providers at the [facility].  Normally, indwelling catheters are replaced once a month.

## 2021-02-03 NOTE — ED Notes (Signed)
Pt reports he is supposed to have his catheter swapped out every 30 days at the [facility] but he has not had his catheter replaced since October of 2022.

## 2021-04-18 ENCOUNTER — Emergency Department (HOSPITAL_COMMUNITY): Payer: No Typology Code available for payment source

## 2021-04-18 ENCOUNTER — Other Ambulatory Visit: Payer: Self-pay

## 2021-04-18 ENCOUNTER — Inpatient Hospital Stay (HOSPITAL_COMMUNITY)
Admission: EM | Admit: 2021-04-18 | Discharge: 2021-04-23 | DRG: 698 | Disposition: A | Discharge status: Skilled Nursing Facility | Payer: No Typology Code available for payment source | Attending: Internal Medicine | Admitting: Internal Medicine

## 2021-04-18 ENCOUNTER — Encounter (HOSPITAL_COMMUNITY): Payer: Self-pay

## 2021-04-18 DIAGNOSIS — F0393 Unspecified dementia, unspecified severity, with mood disturbance: Secondary | ICD-10-CM | POA: Diagnosis present

## 2021-04-18 DIAGNOSIS — I959 Hypotension, unspecified: Secondary | ICD-10-CM | POA: Diagnosis present

## 2021-04-18 DIAGNOSIS — R6520 Severe sepsis without septic shock: Secondary | ICD-10-CM | POA: Diagnosis present

## 2021-04-18 DIAGNOSIS — A4151 Sepsis due to Escherichia coli [E. coli]: Secondary | ICD-10-CM | POA: Diagnosis present

## 2021-04-18 DIAGNOSIS — F319 Bipolar disorder, unspecified: Secondary | ICD-10-CM | POA: Diagnosis present

## 2021-04-18 DIAGNOSIS — I4891 Unspecified atrial fibrillation: Secondary | ICD-10-CM | POA: Diagnosis present

## 2021-04-18 DIAGNOSIS — E872 Acidosis, unspecified: Secondary | ICD-10-CM | POA: Diagnosis present

## 2021-04-18 DIAGNOSIS — E8770 Fluid overload, unspecified: Secondary | ICD-10-CM | POA: Diagnosis present

## 2021-04-18 DIAGNOSIS — N179 Acute kidney failure, unspecified: Secondary | ICD-10-CM | POA: Diagnosis present

## 2021-04-18 DIAGNOSIS — I10 Essential (primary) hypertension: Secondary | ICD-10-CM | POA: Diagnosis present

## 2021-04-18 DIAGNOSIS — I4892 Unspecified atrial flutter: Secondary | ICD-10-CM | POA: Diagnosis present

## 2021-04-18 DIAGNOSIS — N368 Other specified disorders of urethra: Secondary | ICD-10-CM | POA: Diagnosis not present

## 2021-04-18 DIAGNOSIS — R7989 Other specified abnormal findings of blood chemistry: Secondary | ICD-10-CM | POA: Diagnosis present

## 2021-04-18 DIAGNOSIS — I25110 Atherosclerotic heart disease of native coronary artery with unstable angina pectoris: Secondary | ICD-10-CM | POA: Diagnosis present

## 2021-04-18 DIAGNOSIS — K5730 Diverticulosis of large intestine without perforation or abscess without bleeding: Secondary | ICD-10-CM | POA: Diagnosis present

## 2021-04-18 DIAGNOSIS — R112 Nausea with vomiting, unspecified: Secondary | ICD-10-CM | POA: Diagnosis not present

## 2021-04-18 DIAGNOSIS — N390 Urinary tract infection, site not specified: Secondary | ICD-10-CM | POA: Diagnosis present

## 2021-04-18 DIAGNOSIS — T83511A Infection and inflammatory reaction due to indwelling urethral catheter, initial encounter: Secondary | ICD-10-CM | POA: Diagnosis present

## 2021-04-18 DIAGNOSIS — R778 Other specified abnormalities of plasma proteins: Secondary | ICD-10-CM

## 2021-04-18 DIAGNOSIS — I214 Non-ST elevation (NSTEMI) myocardial infarction: Secondary | ICD-10-CM | POA: Diagnosis present

## 2021-04-18 DIAGNOSIS — A419 Sepsis, unspecified organism: Secondary | ICD-10-CM | POA: Diagnosis present

## 2021-04-18 DIAGNOSIS — K8020 Calculus of gallbladder without cholecystitis without obstruction: Secondary | ICD-10-CM | POA: Diagnosis present

## 2021-04-18 DIAGNOSIS — E669 Obesity, unspecified: Secondary | ICD-10-CM | POA: Diagnosis present

## 2021-04-18 DIAGNOSIS — D696 Thrombocytopenia, unspecified: Secondary | ICD-10-CM | POA: Diagnosis present

## 2021-04-18 DIAGNOSIS — E8809 Other disorders of plasma-protein metabolism, not elsewhere classified: Secondary | ICD-10-CM | POA: Diagnosis present

## 2021-04-18 DIAGNOSIS — Z7982 Long term (current) use of aspirin: Secondary | ICD-10-CM

## 2021-04-18 DIAGNOSIS — D708 Other neutropenia: Secondary | ICD-10-CM | POA: Diagnosis not present

## 2021-04-18 DIAGNOSIS — D72819 Decreased white blood cell count, unspecified: Secondary | ICD-10-CM | POA: Diagnosis present

## 2021-04-18 DIAGNOSIS — I252 Old myocardial infarction: Secondary | ICD-10-CM

## 2021-04-18 DIAGNOSIS — R7401 Elevation of levels of liver transaminase levels: Secondary | ICD-10-CM | POA: Diagnosis present

## 2021-04-18 DIAGNOSIS — Z20822 Contact with and (suspected) exposure to covid-19: Secondary | ICD-10-CM | POA: Diagnosis present

## 2021-04-18 DIAGNOSIS — E46 Unspecified protein-calorie malnutrition: Secondary | ICD-10-CM | POA: Diagnosis present

## 2021-04-18 DIAGNOSIS — I441 Atrioventricular block, second degree: Secondary | ICD-10-CM | POA: Diagnosis present

## 2021-04-18 DIAGNOSIS — I2510 Atherosclerotic heart disease of native coronary artery without angina pectoris: Secondary | ICD-10-CM

## 2021-04-18 DIAGNOSIS — Z79899 Other long term (current) drug therapy: Secondary | ICD-10-CM

## 2021-04-18 DIAGNOSIS — I248 Other forms of acute ischemic heart disease: Secondary | ICD-10-CM | POA: Diagnosis not present

## 2021-04-18 DIAGNOSIS — D649 Anemia, unspecified: Secondary | ICD-10-CM | POA: Diagnosis present

## 2021-04-18 DIAGNOSIS — I4510 Unspecified right bundle-branch block: Secondary | ICD-10-CM | POA: Diagnosis present

## 2021-04-18 DIAGNOSIS — Z6834 Body mass index (BMI) 34.0-34.9, adult: Secondary | ICD-10-CM

## 2021-04-18 DIAGNOSIS — Y846 Urinary catheterization as the cause of abnormal reaction of the patient, or of later complication, without mention of misadventure at the time of the procedure: Secondary | ICD-10-CM | POA: Diagnosis present

## 2021-04-18 DIAGNOSIS — R1110 Vomiting, unspecified: Secondary | ICD-10-CM | POA: Diagnosis present

## 2021-04-18 LAB — CBC WITH DIFFERENTIAL/PLATELET
Abs Immature Granulocytes: 0.05 10*3/uL (ref 0.00–0.07)
Basophils Absolute: 0 10*3/uL (ref 0.0–0.1)
Basophils Relative: 1 %
Eosinophils Absolute: 0 10*3/uL (ref 0.0–0.5)
Eosinophils Relative: 2 %
HCT: 43.5 % (ref 39.0–52.0)
Hemoglobin: 14.7 g/dL (ref 13.0–17.0)
Immature Granulocytes: 3 %
Lymphocytes Relative: 23 %
Lymphs Abs: 0.3 10*3/uL — ABNORMAL LOW (ref 0.7–4.0)
MCH: 32.6 pg (ref 26.0–34.0)
MCHC: 33.8 g/dL (ref 30.0–36.0)
MCV: 96.5 fL (ref 80.0–100.0)
Monocytes Absolute: 0 10*3/uL — ABNORMAL LOW (ref 0.1–1.0)
Monocytes Relative: 1 %
Neutro Abs: 1.1 10*3/uL — ABNORMAL LOW (ref 1.7–7.7)
Neutrophils Relative %: 70 %
Platelets: 94 10*3/uL — ABNORMAL LOW (ref 150–400)
RBC: 4.51 MIL/uL (ref 4.22–5.81)
RDW: 12.9 % (ref 11.5–15.5)
WBC: 1.5 10*3/uL — ABNORMAL LOW (ref 4.0–10.5)
nRBC: 0 % (ref 0.0–0.2)

## 2021-04-18 LAB — URINALYSIS, ROUTINE W REFLEX MICROSCOPIC
Bilirubin Urine: NEGATIVE
Glucose, UA: NEGATIVE mg/dL
Ketones, ur: NEGATIVE mg/dL
Nitrite: NEGATIVE
Protein, ur: 100 mg/dL — AB
RBC / HPF: >50 RBC/hpf — ABNORMAL HIGH (ref 0–5)
Specific Gravity, Urine: 1.013 (ref 1.005–1.030)
WBC, UA: >50 WBC/hpf — ABNORMAL HIGH (ref 0–5)
pH: 6 (ref 5.0–8.0)

## 2021-04-18 LAB — COMPREHENSIVE METABOLIC PANEL
ALT: 20 U/L (ref 0–44)
AST: 27 U/L (ref 15–41)
Albumin: 3.6 g/dL (ref 3.5–5.0)
Alkaline Phosphatase: 98 U/L (ref 38–126)
Anion gap: 11 (ref 5–15)
BUN: 23 mg/dL (ref 8–23)
CO2: 25 mmol/L (ref 22–32)
Calcium: 8.9 mg/dL (ref 8.9–10.3)
Chloride: 103 mmol/L (ref 98–111)
Creatinine, Ser: 1.15 mg/dL (ref 0.61–1.24)
GFR, Estimated: >60 mL/min (ref >60)
Glucose, Bld: 109 mg/dL — ABNORMAL HIGH (ref 70–99)
Potassium: 4 mmol/L (ref 3.5–5.1)
Sodium: 139 mmol/L (ref 135–145)
Total Bilirubin: 1.1 mg/dL (ref 0.3–1.2)
Total Protein: 6.7 g/dL (ref 6.5–8.1)

## 2021-04-18 LAB — TROPONIN I (HIGH SENSITIVITY)
Troponin I (High Sensitivity): 110 ng/L (ref <18)
Troponin I (High Sensitivity): 655 ng/L (ref <18)

## 2021-04-18 LAB — LIPASE, BLOOD: Lipase: 56 U/L — ABNORMAL HIGH (ref 11–51)

## 2021-04-18 LAB — APTT: aPTT: 27 seconds (ref 24–36)

## 2021-04-18 LAB — RESP PANEL BY RT-PCR (FLU A&B, COVID) ARPGX2
Influenza A by PCR: NEGATIVE
Influenza B by PCR: NEGATIVE
SARS Coronavirus 2 by RT PCR: NEGATIVE

## 2021-04-18 LAB — LACTIC ACID, PLASMA: Lactic Acid, Venous: 2.9 mmol/L (ref 0.5–1.9)

## 2021-04-18 LAB — PROTIME-INR
INR: 1.1 (ref 0.8–1.2)
Prothrombin Time: 13.7 seconds (ref 11.4–15.2)

## 2021-04-18 MED ORDER — ACETAMINOPHEN 325 MG PO TABS
650.0000 mg | ORAL_TABLET | Freq: Once | ORAL | Status: AC
  Administered 2021-04-18: 650 mg via ORAL
  Filled 2021-04-18: qty 2

## 2021-04-18 MED ORDER — LACTATED RINGERS IV BOLUS
1000.0000 mL | Freq: Once | INTRAVENOUS | Status: AC
Start: 2021-04-18 — End: 2021-04-18
  Administered 2021-04-18: 1000 mL via INTRAVENOUS

## 2021-04-18 MED ORDER — VANCOMYCIN HCL 1500 MG/300ML IV SOLN: 1500.0000 mg | INTRAVENOUS | Status: DC

## 2021-04-18 MED ORDER — AMIODARONE HCL IN DEXTROSE 360-4.14 MG/200ML-% IV SOLN
60.0000 mg/h | INTRAVENOUS | Status: DC
  Administered 2021-04-18: 60 mg/h via INTRAVENOUS
  Filled 2021-04-18: qty 200

## 2021-04-18 MED ORDER — AMIODARONE HCL IN DEXTROSE 360-4.14 MG/200ML-% IV SOLN: 30.0000 mg/h | INTRAVENOUS | Status: DC

## 2021-04-18 MED ORDER — METRONIDAZOLE 500 MG/100ML IV SOLN
500.0000 mg | Freq: Once | INTRAVENOUS | Status: AC
  Administered 2021-04-18: 500 mg via INTRAVENOUS
  Filled 2021-04-18: qty 100

## 2021-04-18 MED ORDER — KETOROLAC TROMETHAMINE 15 MG/ML IJ SOLN
15.0000 mg | Freq: Four times a day (QID) | INTRAMUSCULAR | Status: DC | PRN
  Administered 2021-04-18: 15 mg via INTRAVENOUS
  Filled 2021-04-18: qty 1

## 2021-04-18 MED ORDER — VANCOMYCIN HCL IN DEXTROSE 1-5 GM/200ML-% IV SOLN
1000.0000 mg | Freq: Once | INTRAVENOUS | Status: AC
  Administered 2021-04-18: 1000 mg via INTRAVENOUS
  Filled 2021-04-18: qty 200

## 2021-04-18 MED ORDER — LACTATED RINGERS IV BOLUS
1000.0000 mL | Freq: Once | INTRAVENOUS | Status: AC
  Administered 2021-04-18: 1000 mL via INTRAVENOUS

## 2021-04-18 MED ORDER — SODIUM CHLORIDE 0.9 % IV SOLN
2.0000 g | Freq: Once | INTRAVENOUS | Status: AC
  Administered 2021-04-18: 2 g via INTRAVENOUS
  Filled 2021-04-18: qty 2

## 2021-04-18 MED ORDER — VANCOMYCIN HCL 1500 MG/300ML IV SOLN
1500.0000 mg | Freq: Once | INTRAVENOUS | Status: AC
  Administered 2021-04-18: 1500 mg via INTRAVENOUS
  Filled 2021-04-18: qty 300

## 2021-04-18 MED ORDER — SODIUM CHLORIDE 0.9 % IV SOLN
2.0000 g | Freq: Three times a day (TID) | INTRAVENOUS | Status: DC
  Administered 2021-04-19: 2 g via INTRAVENOUS
  Filled 2021-04-18: qty 2

## 2021-04-18 MED ORDER — LACTATED RINGERS IV SOLN: INTRAVENOUS | Status: AC

## 2021-04-18 NOTE — ED Triage Notes (Signed)
Pt to ED from home via [facility] c/o vomiting x2 days and blood in urine bag from foley in place pta. Temp 103.4, hr 142, 18 rac with 500 fluid bolus going and 4mg  zofran given by ems. Hx of dementia ?

## 2021-04-18 NOTE — ED Notes (Signed)
EDP informed of pt hr ?

## 2021-04-18 NOTE — ED Notes (Signed)
Foley irrigated with minimal output. Replaced foley, bloody urine return. Will continue to monitor.  ?

## 2021-04-18 NOTE — Consult Note (Signed)
Pharmacy Antibiotic Note ? ?Mario Massey is a 76 y.o. male admitted on 04/18/2021 with sepsis.  Pharmacy has been consulted for cefepime & vanco dosing. ? ?Plan: ?Vanco LD=1gm followed by 1500mg  for total dose=2500mg  (21mg /kg).  Scr=1.15 ?MD=1500mg  q24hrs:  AUC=466.5 ?   Cmax=35 ?   Cmin=10.2 ?Cefepime 2gm dose x 1-followup dosing=2gm q8hrs (per [facility] dosing chart) ? ?Height: 6' (182.9 cm) ?Weight: 117 kg (257 lb 15 oz) ?IBW/kg (Calculated) : 77.6 ? ?Temp (24hrs), Avg:103.4 ?F (39.7 ?C), Min:103.4 ?F (39.7 ?C), Max:103.4 ?F (39.7 ?C) ? ?Recent Labs  ?Lab 04/18/21 ?2029  ?LATICACIDVEN 2.9*  ?  ?CrCl=73.61ml/min (Scr=1.15) ? ?No Known Allergies ? ?Antimicrobials this admission: ?0322 Cefepime 2gm x 1 dose >>  ?0322 vanco 2500mg  LD >>  ?0322  metronidazole 500mg  IV x 1 dose >> ? ?Dose adjustments this admission: ?Further dosing evaluations may be needed if Scr improves due to improving AKI.  Levels to be ordered as indicated. ? ?Microbiology results: ?62 Bcx (x2 sites): pending ?0322 UCx: pending  ? ?Thank you for allowing pharmacy to be a part of this patient?s care. ? ?1m, RPh ?04/18/2021 9:18 PM ? ?

## 2021-04-18 NOTE — ED Provider Notes (Signed)
[facility] EMERGENCY DEPARTMENT Provider Note   CSN: [id number] Arrival date & time: 04/18/21  2018     History  Chief Complaint  Patient presents with   Emesis    Mario Massey is a 76 y.o. male.   Emesis Patient presents with nausea vomiting and feeling bad.  2 days of vomiting.  Also blood in his Foley bag.  Arrives with temperature of 103.  Does have some reported dementia.  Patient does states he feels bad.  No history of atrial fibrillation but arrives in A-fib.   Past Medical History:  Diagnosis Date   Bipolar disorder (HCC)    Coronary artery disease    Depression    Hypertension     Home Medications Prior to Admission medications   Medication Sig Start Date End Date Taking? Authorizing Provider  aspirin 81 MG chewable tablet aspirin 81 mg chewable tablet  Chew 1 tablet every day by oral route. Patient not taking: No sig reported    [provider]  aspirin 81 MG EC tablet Take 1 tablet by mouth daily. Patient not taking: No sig reported 12/12/05   [provider]  aspirin 81 MG tablet Take 81 mg by mouth daily. Patient not taking: No sig reported    [provider]  cephALEXin (KEFLEX) 500 MG capsule Take 1 capsule (500 mg total) by mouth 4 (four) times daily. 11/07/20   Bethann Berkshire, MD  clotrimazole (LOTRIMIN) 1 % cream Apply to affected area 2 times daily 05/30/19   Pollyann Savoy, MD  clotrimazole (LOTRIMIN) 1 % cream Apply topically. Patient not taking: No sig reported 05/26/20   [provider]  doxazosin (CARDURA) 8 MG tablet Take 8 mg by mouth at bedtime. Patient not taking: No sig reported    [provider]  DULoxetine (CYMBALTA) 20 MG capsule Take 20 mg by mouth daily. Patient not taking: No sig reported    [provider]  finasteride (PROSCAR) 5 MG tablet Take 5 mg by mouth daily. Patient not taking: No sig reported    [provider]  guaifenesin (HUMIBID E) 400 MG TABS tablet  Take 1 tablet by mouth 3 (three) times daily as needed. Patient not taking: No sig reported 11/06/20   [provider]  HYDROcodone-acetaminophen (NORCO/VICODIN) 5-325 MG tablet Take 1 tablet by mouth every 6 (six) hours as needed. Patient not taking: No sig reported 10/19/19   Bethann Berkshire, MD  lisinopril (PRINIVIL,ZESTRIL) 5 MG tablet Take 5 mg by mouth daily.    [provider]  Multiple Vitamin (MULTIVITAMIN) capsule Take 1 tablet by mouth daily. Patient not taking: No sig reported 03/19/05   [provider]  nitroGLYCERIN (NITROSTAT) 0.4 MG SL tablet Place 0.4 mg under the tongue every 5 (five) minutes as needed for chest pain.    [provider]  QUEtiapine (SEROQUEL) 25 MG tablet Take by mouth. Patient not taking: No sig reported 02/01/20   [provider]  tamsulosin (FLOMAX) 0.4 MG CAPS Take 0.4 mg by mouth daily.    [provider]  venlafaxine XR (EFFEXOR-XR) 150 MG 24 hr capsule Take 150 mg by mouth daily.    [provider]  venlafaxine XR (EFFEXOR-XR) 150 MG 24 hr capsule Take 1 capsule by mouth daily. Patient not taking: No sig reported 01/10/20   [provider]  vitamin E 180 MG (400 UNITS) capsule Take 1 tablet by mouth daily. Patient not taking: No sig reported 03/19/05   [provider]  ziprasidone (GEODON) 20 MG capsule Take 20 mg by mouth 2 (two) times daily with a meal. 01/10/20   [provider]      Allergies    Patient has no known allergies.    Review of Systems   Review of Systems  Gastrointestinal:  Positive for vomiting.  Psychiatric/Behavioral:  Positive for confusion.    Physical Exam Updated Vital Signs BP 109/72   Pulse (!) 120   Temp (!) 103.3 F (39.6 C)   Resp (!) 22   Ht 6' (1.829 m)   Wt 117 kg   SpO2 94%   BMI 34.98 kg/m  Physical Exam Vitals reviewed.  Constitutional:      Comments: Confused  Cardiovascular:     Rate and Rhythm: Tachycardia  present. Rhythm irregular.  Abdominal:     Comments: Mild upper abdominal tenderness without rebound or guarding.  Genitourinary:    Comments: Foley catheter in place.  Blood in the bag, Foley balloon halfway down penis. Musculoskeletal:        General: No tenderness.  Neurological:     Comments: Patient is confused.    ED Results / Procedures / Treatments   Labs (all labs ordered are listed, but only abnormal results are displayed) Labs Reviewed  COMPREHENSIVE METABOLIC PANEL - Abnormal; Notable for the following components:      Result Value   Glucose, Bld 109 (*)    All other components within normal limits  LACTIC ACID, PLASMA - Abnormal; Notable for the following components:   Lactic Acid, Venous 2.9 (*)    All other components within normal limits  LIPASE, BLOOD - Abnormal; Notable for the following components:   Lipase 56 (*)    All other components within normal limits  CBC WITH DIFFERENTIAL/PLATELET - Abnormal; Notable for the following components:   WBC 1.5 (*)    Platelets 94 (*)    Neutro Abs 1.1 (*)    Lymphs Abs 0.3 (*)    Monocytes Absolute 0.0 (*)    All other components within normal limits  URINALYSIS, ROUTINE W REFLEX MICROSCOPIC - Abnormal; Notable for the following components:   APPearance CLOUDY (*)    Hgb urine dipstick LARGE (*)    Protein, ur 100 (*)    Leukocytes,Ua LARGE (*)    RBC / HPF >50 (*)    WBC, UA >50 (*)    Bacteria, UA MANY (*)    All other components within normal limits  TROPONIN I (HIGH SENSITIVITY) - Abnormal; Notable for the following components:   Troponin I (High Sensitivity) 110 (*)    All other components within normal limits  CULTURE, BLOOD (ROUTINE X 2)  CULTURE, BLOOD (ROUTINE X 2)  RESP PANEL BY RT-PCR (FLU A&B, COVID) [id number]  URINE CULTURE  PROTIME-INR  APTT  LACTIC ACID, PLASMA  CREATININE, SERUM  TROPONIN I (HIGH SENSITIVITY)    EKG EKG Interpretation  Date/Time:  Wednesday April 18 2021 20:28:49  EDT Ventricular Rate:  140 PR Interval:  266 QRS Duration: 138 QT Interval:  337 QTC Calculation: 515 R Axis:   -42 Text Interpretation: afib? 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 Confirmed by Benjiman Core [phone]) on 04/18/2021 8:35:37 PM  Radiology DG Chest Portable 1 View  Result Date: 04/18/2021 CLINICAL DATA:  Fever EXAM: PORTABLE CHEST 1 VIEW COMPARISON:  08/16/2020 FINDINGS: Lung volumes are small, but are symmetric and are clear. No pneumothorax or pleural effusion. Cardiac size within  normal limits when accounting for poor pulmonary insufflation. Vascular crowding at the hila. No acute bone abnormality. Degenerative changes noted within the shoulders bilaterally. IMPRESSION: Pulmonary hypoinflation. Electronically Signed   By: Helyn Numbers M.D.   On: 04/18/2021 21:25    Procedures Procedures    Medications Ordered in ED Medications  lactated ringers infusion ( Intravenous New Bag/Given 04/18/21 2047)  vancomycin (VANCOREADY) IVPB 1500 mg/300 mL (1,500 mg Intravenous New Bag/Given 04/18/21 2213)  ceFEPIme (MAXIPIME) 2 g in sodium chloride 0.9 % 100 mL IVPB (has no administration in time range)  vancomycin (VANCOREADY) IVPB 1500 mg/300 mL (has no administration in time range)  lactated ringers bolus 1,000 mL (0 mLs Intravenous Stopped 04/18/21 2127)  ceFEPIme (MAXIPIME) 2 g in sodium chloride 0.9 % 100 mL IVPB (0 g Intravenous Stopped 04/18/21 2127)  metroNIDAZOLE (FLAGYL) IVPB 500 mg (0 mg Intravenous Stopped 04/18/21 2132)  vancomycin (VANCOCIN) IVPB 1000 mg/200 mL premix (0 mg Intravenous Stopped 04/18/21 2236)  acetaminophen (TYLENOL) tablet 650 mg (650 mg Oral Given 04/18/21 2212)  lactated ringers bolus 1,000 mL (1,000 mLs Intravenous New Bag/Given 04/18/21 2212)    ED Course/ Medical Decision Making/ A&P                           Medical Decision Making Amount and/or Complexity of Data Reviewed Labs: ordered. Radiology:  ordered and independent interpretation performed. ECG/medicine tests: independent interpretation performed.  Risk OTC drugs. Prescription drug management. Decision regarding hospitalization.  Critical Care Total time providing critical care: 30 minutes  Patient presents with fever and mental status change.  Confusion.  Initial tachycardia but not hypotensive.  Lactic acid mildly elevated.  Fluid bolus given.  Has had approximate 2 L of fluid.  Initially started antibiotics for unknown infection but later urine did show infection.  White count somewhat low at 1.5.  Blood and urine cultures been done. Also found to have new onset atrial fibrillation.  Not on blood thinners.  Troponin likely elevated due to rate and demand from the infection.  Initial lactic acid elevated but still under 4.  EKG interpreted by me.  Chest x-ray also independently interpreted and showed no pneumonia.  CHA2DS2-VASc score of 4.  Will discuss with hospitalist for admission         Final Clinical Impression(s) / ED Diagnoses Final diagnoses:  Urinary tract infection associated with indwelling urethral catheter, initial encounter [facility])  Atrial fibrillation with rapid ventricular response (HCC)  Sepsis, due to unspecified organism, unspecified whether acute organ dysfunction present [facility])    Rx / DC Orders ED Discharge Orders     None         Benjiman Core, MD 04/18/21 2301

## 2021-04-18 NOTE — H&P (Signed)
?History and Physical  ? ? ?Patient: Mario Massey YSA:[id number] DOB: 12-20-1945 ?DOA: 04/18/2021 ?DOS: the patient was seen and examined on 04/19/2021 ?PCP: [facility]  ?Patient coming from: Home ? ?Chief Complaint:  ?Chief Complaint  ?Patient presents with  ? Emesis  ? ?HPI: Mario Massey is a 76 y.o. male with medical history significant of hypertension, CAD, depression and bipolar disorder who presents to the emergency department via EMS due to 2-day history of vomiting and blood in Foley bag and was febrile on arrival to the ED with a temperature of 1066F.  Patient was unable to provide more detailed history, patient appears to have a reported dementia per ED medical record. ? ?ED course: ?In the emergency department, Mario Massey, tachycardic, febrile with a temperature of 103.66F, BP was 110/64, O2 sat was 96- 98%.  Work-up in the ED showed normal CBC except for WBC 1.5, platelets 94.  BMP was normal except for bicarb of 21 and BUN/creatinine 27/1.65 (baseline creatinine at 0.8-1.2), magnesium 1.4, phosphorous 2.3.  Lactic acid 2.9 > 3.0, troponin 110 > 655 > 2,362.  Influenza A, B, SARS coronavirus 2 was negative. ?Chest x-ray showed pulmonary hypoinflation ?Patient was treated with IV vancomycin, Flagyl and cefepime, IV hydration was provided.  Hospitalist was asked to admit patient for further evaluation and management. ? ? ?Review of Systems: As mentioned in the history of present illness. All other systems reviewed and are negative. ? ? ?Past Medical History:  ?Diagnosis Date  ? Bipolar disorder ([place])   ? Coronary artery disease   ? Depression   ? Hypertension   ? ?Past Surgical History:  ?Procedure Laterality Date  ? KNEE SURGERY    ? with replacement on right knee  ? ?Social History:  reports that he has never smoked. He has never used smokeless tobacco. He reports current alcohol use. He reports that he does not use drugs. ? ?No Known Allergies ? ?History reviewed. No pertinent family  history. ? ?Prior to Admission medications   ?Medication Sig Start Date End Date Taking? Authorizing Provider  ?aspirin 81 MG chewable tablet aspirin 81 mg chewable tablet ? Chew 1 tablet every day by oral route. ?Patient not taking: No sig reported    [provider]  ?aspirin 81 MG EC tablet Take 1 tablet by mouth daily. ?Patient not taking: No sig reported 12/12/05   [provider]  ?aspirin 81 MG tablet Take 81 mg by mouth daily. ?Patient not taking: No sig reported    [provider]  ?cephALEXin (KEFLEX) 500 MG capsule Take 1 capsule (500 mg total) by mouth 4 (four) times daily. 11/07/20   Milton Ferguson, MD  ?clotrimazole (LOTRIMIN) 1 % cream Apply to affected area 2 times daily 05/30/19   Truddie Hidden, MD  ?clotrimazole (LOTRIMIN) 1 % cream Apply topically. ?Patient not taking: No sig reported 05/26/20   [provider]  ?doxazosin (CARDURA) 8 MG tablet Take 8 mg by mouth at bedtime. ?Patient not taking: No sig reported    [provider]  ?DULoxetine (CYMBALTA) 20 MG capsule Take 20 mg by mouth daily. ?Patient not taking: No sig reported    [provider]  ?finasteride (PROSCAR) 5 MG tablet Take 5 mg by mouth daily. ?Patient not taking: No sig reported    [provider]  ?guaifenesin (HUMIBID E) 400 MG TABS tablet Take 1 tablet by mouth 3 (three) times daily as needed. ?Patient not taking: No sig reported 11/06/20   [provider]  ?HYDROcodone-acetaminophen (NORCO/VICODIN) 5-325 MG tablet Take 1 tablet by mouth every 6 (six) hours as needed. ?Patient not taking: No sig reported 10/19/19   Milton Ferguson, MD  ?lisinopril (PRINIVIL,ZESTRIL) 5 MG tablet Take 5 mg by mouth daily.    [provider]  ?Multiple Vitamin (MULTIVITAMIN) capsule Take 1 tablet by mouth daily. ?Patient not taking: No sig reported 03/19/05   [provider]  ?nitroGLYCERIN (NITROSTAT) 0.4 MG SL tablet Place 0.4 mg under the tongue every 5  (five) minutes as needed for chest pain.    [provider]  ?QUEtiapine (SEROQUEL) 25 MG tablet Take by mouth. ?Patient not taking: No sig reported 02/01/20   [provider]  ?tamsulosin (FLOMAX) 0.4 MG CAPS Take 0.4 mg by mouth daily.    [provider]  ?venlafaxine XR (EFFEXOR-XR) 150 MG 24 hr capsule Take 150 mg by mouth daily.    [provider]  ?venlafaxine XR (EFFEXOR-XR) 150 MG 24 hr capsule Take 1 capsule by mouth daily. ?Patient not taking: No sig reported 01/10/20   [provider]  ?vitamin E 180 MG (400 UNITS) capsule Take 1 tablet by mouth daily. ?Patient not taking: No sig reported 03/19/05   [provider]  ?ziprasidone (GEODON) 20 MG capsule Take 20 mg by mouth 2 (two) times daily with a meal. 01/10/20   [provider]  ? ? ?Physical Exam: ?Vitals:  ? 04/19/21 0000 04/19/21 0100 04/19/21 0400 04/19/21 0507  ?BP: 101/85 99/66 (!) 151/126 (!) 85/50  ?Pulse: (!) 107 87 81   ?Resp: 20 (!) 23 (!) 28   ?Temp: (!) 102.8 ?F (39.3 ?C)     ?TempSrc: Oral     ?SpO2: 96% 96% 98%   ?Weight: 114 kg     ?Height:      ? ?General: Elderly male. Awake and alert and oriented x3. Not in any acute distress.  ?HEENT: NCAT.  PERRLA. EOMI. Sclerae anicteric.  Moist mucosal membranes. ?Neck: Neck supple without lymphadenopathy. No carotid bruits. No masses palpated.  ?Cardiovascular: Tachycardia.  Regular rate with normal S1-S2 sounds. No murmurs, rubs or gallops auscultated. No JVD.  ?Respiratory: Clear breath sounds.  No accessory muscle use. ?Abdomen: Soft, nontender, nondistended. Active bowel sounds. No masses or hepatosplenomegaly  ?Genitourinary: Foley catheter already changed at the time of exam ?Skin: No rashes, lesions, or ulcerations.  Dry, warm to touch. ?Musculoskeletal:  2+ dorsalis pedis and radial pulses. Good ROM.  No contractures  ?Psychiatric: Mild confusion.  Mood appropriate to current condition. ?Neurologic: No focal neurological  deficits. Strength is 5/5 x 4.  CN II - XII grossly intact. ? ?Data Reviewed: ?EKG personally reviewed showed second-degree AV block ([place]obitz 1) at rate of 91 bpm ? ?Assessment and Plan: ?* Sepsis secondary to UTI [facility]) ?Patient met sepsis criteria due to being febrile, tachypneic and tachycardic.  WBC was also 1.5 (less than 4) and this has since resulted in leukocytosis. ?Urinalysis was suggestive of UTI ?Patient has lactic acidosis ?He was started on IV vancomycin, Flagyl and cefepime.  We will continue with same at this time ?IV hydration per sepsis protocol was provided.  Continue IV hydration ?Urine culture and blood culture pending ?Of note, urine culture done on 11/07/2020 was positive for E. coli and this was sensitive to cefepime. ?Continue Tylenol as needed for fever ? ?Elevated troponin ?This is possibly secondary to type II demand ischemia ?Troponin 110 > 655 > 2,362 > 2133; this has started to trend downwards ?Cardiologist (  Dr. Toney Rakes) on-call at [facility] was consulted and he reassured that this was possibly still due to type II demand ischemia considering patient's clinical picture of being septic. ?Aspirin 325 mg x 1 was given ?Cardiology will be consulted to see patient in the morning and we shall await further recommendations ? ?AKI (acute kidney injury) ([place]) ?BUN/creatinine 27/1.65 (baseline creatinine at 0.8-1.2 ?Continue IV hydration ?Renally adjust medications, avoid nephrotoxic agents/dehydration/hypotension ? ? ?Lactic acidosis ? Lactic acid 2.9 > 3.0 ?Continue IV hydration ?Continue to trend lactic acid ? ?Hypophosphatemia ?Phosphorous 2.3, this was replenished ? ?Hypomagnesemia ?Mg level is 1.4 ?This will be replenished ?Please continue to monitor Mg level and correct accordingly ? ? ?Thrombocytopenia ([place]) ?Platelets 94, this has since decreased to 88 ?Anticoagulants will be held at this time ?Continue to monitor platelet levels ? ?CAD (coronary artery disease) ?Continue aspirin 81 mg  daily ?Nitroglycerin will be held at this time due to soft BP ?Patient will was not on any statins per med rec ? ?Essential hypertension ?BP meds will be held at this time due to soft BP ? ?Transaminitis ?AST 124, ALT 60, this is

## 2021-04-18 NOTE — Sepsis Progress Note (Signed)
Following for sepsis monitoring ?

## 2021-04-19 ENCOUNTER — Other Ambulatory Visit: Payer: Self-pay

## 2021-04-19 ENCOUNTER — Other Ambulatory Visit (HOSPITAL_COMMUNITY): Payer: No Typology Code available for payment source

## 2021-04-19 ENCOUNTER — Other Ambulatory Visit (HOSPITAL_COMMUNITY): Payer: Self-pay | Admitting: *Deleted

## 2021-04-19 ENCOUNTER — Inpatient Hospital Stay (HOSPITAL_COMMUNITY): Payer: No Typology Code available for payment source

## 2021-04-19 DIAGNOSIS — E669 Obesity, unspecified: Secondary | ICD-10-CM

## 2021-04-19 DIAGNOSIS — R112 Nausea with vomiting, unspecified: Secondary | ICD-10-CM

## 2021-04-19 DIAGNOSIS — E8809 Other disorders of plasma-protein metabolism, not elsewhere classified: Secondary | ICD-10-CM

## 2021-04-19 DIAGNOSIS — I214 Non-ST elevation (NSTEMI) myocardial infarction: Secondary | ICD-10-CM

## 2021-04-19 DIAGNOSIS — R6520 Severe sepsis without septic shock: Secondary | ICD-10-CM

## 2021-04-19 DIAGNOSIS — B9620 Unspecified Escherichia coli [E. coli] as the cause of diseases classified elsewhere: Secondary | ICD-10-CM

## 2021-04-19 DIAGNOSIS — N179 Acute kidney failure, unspecified: Secondary | ICD-10-CM | POA: Diagnosis not present

## 2021-04-19 DIAGNOSIS — E872 Acidosis, unspecified: Secondary | ICD-10-CM

## 2021-04-19 DIAGNOSIS — I441 Atrioventricular block, second degree: Secondary | ICD-10-CM

## 2021-04-19 DIAGNOSIS — I10 Essential (primary) hypertension: Secondary | ICD-10-CM | POA: Diagnosis not present

## 2021-04-19 DIAGNOSIS — A419 Sepsis, unspecified organism: Secondary | ICD-10-CM | POA: Diagnosis not present

## 2021-04-19 DIAGNOSIS — D649 Anemia, unspecified: Secondary | ICD-10-CM

## 2021-04-19 DIAGNOSIS — I2510 Atherosclerotic heart disease of native coronary artery without angina pectoris: Secondary | ICD-10-CM

## 2021-04-19 DIAGNOSIS — D696 Thrombocytopenia, unspecified: Secondary | ICD-10-CM

## 2021-04-19 DIAGNOSIS — D72819 Decreased white blood cell count, unspecified: Secondary | ICD-10-CM

## 2021-04-19 DIAGNOSIS — R7401 Elevation of levels of liver transaminase levels: Secondary | ICD-10-CM

## 2021-04-19 DIAGNOSIS — D708 Other neutropenia: Secondary | ICD-10-CM

## 2021-04-19 DIAGNOSIS — R7881 Bacteremia: Secondary | ICD-10-CM

## 2021-04-19 LAB — MRSA NEXT GEN BY PCR, NASAL: MRSA by PCR Next Gen: NOT DETECTED

## 2021-04-19 LAB — BLOOD CULTURE ID PANEL (REFLEXED) - BCID2

## 2021-04-19 LAB — CBC
HCT: 39.4 % (ref 39.0–52.0)
Hemoglobin: 12.9 g/dL — ABNORMAL LOW (ref 13.0–17.0)
MCH: 31.2 pg (ref 26.0–34.0)
MCHC: 32.7 g/dL (ref 30.0–36.0)
MCV: 95.2 fL (ref 80.0–100.0)
Platelets: 88 10*3/uL — ABNORMAL LOW (ref 150–400)
RBC: 4.14 MIL/uL — ABNORMAL LOW (ref 4.22–5.81)
RDW: 13.1 % (ref 11.5–15.5)
WBC: 22.9 10*3/uL — ABNORMAL HIGH (ref 4.0–10.5)
nRBC: 0.1 % (ref 0.0–0.2)

## 2021-04-19 LAB — COMPREHENSIVE METABOLIC PANEL
ALT: 60 U/L — ABNORMAL HIGH (ref 0–44)
AST: 124 U/L — ABNORMAL HIGH (ref 15–41)
Albumin: 3 g/dL — ABNORMAL LOW (ref 3.5–5.0)
Alkaline Phosphatase: 61 U/L (ref 38–126)
Anion gap: 11 (ref 5–15)
BUN: 27 mg/dL — ABNORMAL HIGH (ref 8–23)
CO2: 21 mmol/L — ABNORMAL LOW (ref 22–32)
Calcium: 8.4 mg/dL — ABNORMAL LOW (ref 8.9–10.3)
Chloride: 105 mmol/L (ref 98–111)
Creatinine, Ser: 1.65 mg/dL — ABNORMAL HIGH (ref 0.61–1.24)
GFR, Estimated: 43 mL/min — ABNORMAL LOW (ref >60)
Glucose, Bld: 95 mg/dL (ref 70–99)
Potassium: 3.5 mmol/L (ref 3.5–5.1)
Sodium: 137 mmol/L (ref 135–145)
Total Bilirubin: 1.3 mg/dL — ABNORMAL HIGH (ref 0.3–1.2)
Total Protein: 5.8 g/dL — ABNORMAL LOW (ref 6.5–8.1)

## 2021-04-19 LAB — TROPONIN I (HIGH SENSITIVITY)
Troponin I (High Sensitivity): 2362 ng/L (ref <18)
Troponin I (High Sensitivity): 2133 ng/L (ref <18)
Troponin I (High Sensitivity): 1895 ng/L (ref <18)

## 2021-04-19 LAB — PHOSPHORUS: Phosphorus: 2.3 mg/dL — ABNORMAL LOW (ref 2.5–4.6)

## 2021-04-19 LAB — HEPARIN LEVEL (UNFRACTIONATED): Heparin Unfractionated: 0.64 [IU]/mL (ref 0.30–0.70)

## 2021-04-19 LAB — LACTIC ACID, PLASMA
Lactic Acid, Venous: 3 mmol/L (ref 0.5–1.9)
Lactic Acid, Venous: 2.2 mmol/L (ref 0.5–1.9)
Lactic Acid, Venous: 2.7 mmol/L (ref 0.5–1.9)

## 2021-04-19 LAB — MAGNESIUM: Magnesium: 1.4 mg/dL — ABNORMAL LOW (ref 1.7–2.4)

## 2021-04-19 LAB — GLUCOSE, CAPILLARY: Glucose-Capillary: 82 mg/dL (ref 70–99)

## 2021-04-19 MED ORDER — ASPIRIN 325 MG PO TABS: 325.0000 mg | ORAL_TABLET | Freq: Every day | ORAL | Status: DC

## 2021-04-19 MED ORDER — SODIUM CHLORIDE 0.9 % IV SOLN
2.0000 g | INTRAVENOUS | Status: DC
  Administered 2021-04-19 – 2021-04-22 (×4): 2 g via INTRAVENOUS
  Filled 2021-04-19 (×4): qty 20

## 2021-04-19 MED ORDER — CHLORHEXIDINE GLUCONATE CLOTH 2 % EX PADS
6.0000 | MEDICATED_PAD | Freq: Every day | CUTANEOUS | Status: DC
  Administered 2021-04-19 – 2021-04-23 (×5): 6 via TOPICAL

## 2021-04-19 MED ORDER — ONDANSETRON HCL 4 MG/2ML IJ SOLN
4.0000 mg | Freq: Four times a day (QID) | INTRAMUSCULAR | Status: DC | PRN
  Administered 2021-04-19: 4 mg via INTRAVENOUS
  Filled 2021-04-19 (×2): qty 2

## 2021-04-19 MED ORDER — HEPARIN BOLUS VIA INFUSION
4000.0000 [IU] | Freq: Once | INTRAVENOUS | Status: AC
  Administered 2021-04-19: 4000 [IU] via INTRAVENOUS
  Filled 2021-04-19: qty 4000

## 2021-04-19 MED ORDER — VENLAFAXINE HCL ER 75 MG PO CP24
150.0000 mg | ORAL_CAPSULE | Freq: Every day | ORAL | Status: DC
  Administered 2021-04-19 – 2021-04-23 (×5): 150 mg via ORAL
  Filled 2021-04-19 (×5): qty 2

## 2021-04-19 MED ORDER — MAGNESIUM SULFATE 2 GM/50ML IV SOLN
2.0000 g | Freq: Once | INTRAVENOUS | Status: AC
Start: 2021-04-19 — End: 2021-04-19
  Administered 2021-04-19: 2 g via INTRAVENOUS
  Filled 2021-04-19: qty 50

## 2021-04-19 MED ORDER — POTASSIUM PHOSPHATES 15 MMOLE/5ML IV SOLN
30.0000 mmol | Freq: Once | INTRAVENOUS | Status: AC
  Administered 2021-04-19: 30 mmol via INTRAVENOUS
  Filled 2021-04-19 (×2): qty 10

## 2021-04-19 MED ORDER — SODIUM CHLORIDE 0.9 % IV BOLUS
500.0000 mL | Freq: Once | INTRAVENOUS | Status: AC
  Administered 2021-04-19: 500 mL via INTRAVENOUS

## 2021-04-19 MED ORDER — HEPARIN (PORCINE) 25000 UT/250ML-% IV SOLN
1200.0000 [IU]/h | INTRAVENOUS | Status: DC
  Administered 2021-04-19 – 2021-04-20 (×2): 1200 [IU]/h via INTRAVENOUS
  Filled 2021-04-19 (×2): qty 250

## 2021-04-19 MED ORDER — ALUM & MAG HYDROXIDE-SIMETH 200-200-20 MG/5ML PO SUSP
30.0000 mL | ORAL | Status: DC | PRN
  Administered 2021-04-19 (×2): 30 mL via ORAL
  Filled 2021-04-19 (×3): qty 30

## 2021-04-19 MED ORDER — ACETAMINOPHEN 325 MG PO TABS: 650.0000 mg | ORAL_TABLET | Freq: Four times a day (QID) | ORAL | Status: DC | PRN

## 2021-04-19 MED ORDER — ASPIRIN EC 81 MG PO TBEC
81.0000 mg | DELAYED_RELEASE_TABLET | Freq: Every day | ORAL | Status: DC
  Administered 2021-04-20 – 2021-04-23 (×4): 81 mg via ORAL
  Filled 2021-04-19 (×4): qty 1

## 2021-04-19 MED ORDER — ZIPRASIDONE HCL 20 MG PO CAPS
20.0000 mg | ORAL_CAPSULE | Freq: Every day | ORAL | Status: DC
Start: 2021-04-19 — End: 2021-04-23
  Administered 2021-04-19 – 2021-04-23 (×5): 20 mg via ORAL
  Filled 2021-04-19 (×7): qty 1

## 2021-04-19 MED ORDER — ENSURE ENLIVE PO LIQD
237.0000 mL | Freq: Two times a day (BID) | ORAL | Status: DC
  Administered 2021-04-23 (×2): 237 mL via ORAL

## 2021-04-19 MED ORDER — LACTATED RINGERS IV BOLUS
1000.0000 mL | Freq: Once | INTRAVENOUS | Status: AC
  Administered 2021-04-19: 1000 mL via INTRAVENOUS

## 2021-04-19 MED ORDER — PROCHLORPERAZINE EDISYLATE 10 MG/2ML IJ SOLN: 10.0000 mg | Freq: Four times a day (QID) | INTRAMUSCULAR | Status: DC | PRN

## 2021-04-19 MED ORDER — ASPIRIN 325 MG PO TABS
325.0000 mg | ORAL_TABLET | Freq: Once | ORAL | Status: AC
  Administered 2021-04-19: 325 mg via ORAL
  Filled 2021-04-19: qty 1

## 2021-04-19 MED ORDER — IOHEXOL 9 MG/ML PO SOLN
ORAL | Status: AC
  Filled 2021-04-19: qty 1000

## 2021-04-19 MED ORDER — LACTATED RINGERS IV SOLN: INTRAVENOUS | Status: DC

## 2021-04-19 MED ORDER — ASPIRIN 325 MG PO TABS
ORAL_TABLET | ORAL | Status: AC
  Filled 2021-04-19: qty 1

## 2021-04-19 MED ORDER — LACTATED RINGERS IV BOLUS
500.0000 mL | Freq: Once | INTRAVENOUS | Status: AC
  Administered 2021-04-19: 500 mL via INTRAVENOUS

## 2021-04-19 NOTE — Assessment & Plan Note (Signed)
-  Complicates overall prognosis and care ?-Estimated body mass index is 34.09 kg/m? as calculated from the following: ?  Height as of this encounter: 6' (1.829 m). ?  Weight as of this encounter: 114 kg.  ?-Weight Loss and Dietary Counseling given ? ?

## 2021-04-19 NOTE — Assessment & Plan Note (Addendum)
-  Lactic acid 2.9 > 3.0 -> 2.2 -> 2.7 -> 1.9 ?-IV fluid hydration has now stopped ?-Continue to monitor given that his lactic acid has resolved ?

## 2021-04-19 NOTE — Assessment & Plan Note (Addendum)
-   In the setting of his E. coli bacteremia and infection, improved ?-Checking a CT scan of the abdomen pelvis without IV contrast but with oral contrast and it was done and showed no evidence of acute intra abdominal pathology causing his symptoms ?-Continue supportive care and antiemetics and added Compazine as needed ? ?

## 2021-04-19 NOTE — Consult Note (Signed)
?Cardiology Consultation:  ? ?Patient ID: Mario Massey ?MRN: [id number]; DOB: May 26, 1945 ? ?Admit date: 04/18/2021 ?Date of Consult: 04/19/2021 ? ?PCP:  [facility] ?  ?[facility] Providers ?Cardiologist: New to [facility] ? ?Patient Profile:  ? ?Mario Massey is a 76 y.o. male with a hx of CAD, HTN, HLD and Bipolar Disorder who is being seen 04/19/2021 for the evaluation of elevated troponin values and Wenckebach at the request of Dr. Thomes Dinning. ? ?History of Present Illness:  ? ?Mario Massey presented to [facility] ED on 04/18/2021 for evaluation of worsening nausea and vomiting for the past several days along with hematuria. In talking with the patient today, he reports he lives at home by himself with his cat but family members check on him frequently. Says over the past 2-3 days, he has experienced worsening nausea and vomiting with minimal food consumption. This morning, he consumed coffee and one sausage link and vomited everything at that time. When asked about any recent chest pain, he says it "hurts around his heart" but he points to his epigastric and LUQ region. Denies any specific orthopnea, PND or pitting edema. Does report having dyspnea on exertion. Listed as having a history of CAD by chart review but the patient denies any known history of CAD, CHF or cardiac arrhythmias.  Unaware of any family history of cardiac issues.  ? ?He was febrile to 103.4 upon arrival. Initial labs showed WBC 1.5, Hgb 14.7, platelets 94, Na+ 139, K+ 4.0 and creatinine 1.15.  AST 27 and ALT 20.  Lactic acid 2.9 with repeat of 3.0. Initial Hs Troponin 110 with repeat values of 655, 2362 and 2133.  UA concerning for UTI with culture results pending.  Negative for COVID and influenza.  CXR showing hypoinflation with no acute changes. Initial EKG showing sinus tachycardia versus atrial fibrillation, HR 140 with underlying RBBB. ? ?He was admitted for sepsis in the setting of a UTI and has been started on IV Vancomycin  and Cefepime. Also receiving IV fluids per sepsis protocol. Repeat labs this AM show his creatinine is trending up to 1.65 and WBC's at 22.9. ? ? ?Past Medical History:  ?Diagnosis Date  ? Bipolar disorder ([facility])   ? Coronary artery disease   ? Depression   ? Hypertension   ? ? ?Past Surgical History:  ?Procedure Laterality Date  ? KNEE SURGERY    ? with replacement on right knee  ?  ? ?Home Medications:  ?Prior to Admission medications   ?Medication Sig Start Date End Date Taking? Authorizing Provider  ?aspirin 81 MG chewable tablet aspirin 81 mg chewable tablet ? Chew 1 tablet every day by oral route. ?Patient not taking: No sig reported    [provider]  ?aspirin 81 MG EC tablet Take 1 tablet by mouth daily. ?Patient not taking: No sig reported 12/12/05   [provider]  ?aspirin 81 MG tablet Take 81 mg by mouth daily. ?Patient not taking: No sig reported    [provider]  ?cephALEXin (KEFLEX) 500 MG capsule Take 1 capsule (500 mg total) by mouth 4 (four) times daily. 11/07/20   Bethann Berkshire, MD  ?clotrimazole (LOTRIMIN) 1 % cream Apply to affected area 2 times daily 05/30/19   Pollyann Savoy, MD  ?clotrimazole (LOTRIMIN) 1 % cream Apply topically. ?Patient not taking: No sig reported 05/26/20   [provider]  ?doxazosin (CARDURA) 8 MG tablet Take 8 mg by mouth at bedtime. ?Patient not taking: No sig reported  [provider]  ?DULoxetine (CYMBALTA) 20 MG capsule Take 20 mg by mouth daily. ?Patient not taking: No sig reported    [provider]  ?finasteride (PROSCAR) 5 MG tablet Take 5 mg by mouth daily. ?Patient not taking: No sig reported    [provider]  ?guaifenesin (HUMIBID E) 400 MG TABS tablet Take 1 tablet by mouth 3 (three) times daily as needed. ?Patient not taking: No sig reported 11/06/20   [provider]  ?HYDROcodone-acetaminophen (NORCO/VICODIN) 5-325 MG tablet Take 1 tablet by mouth every 6 (six) hours as  needed. ?Patient not taking: No sig reported 10/19/19   Bethann BerkshireZammit, Joseph, MD  ?lisinopril (PRINIVIL,ZESTRIL) 5 MG tablet Take 5 mg by mouth daily.    [provider]  ?Multiple Vitamin (MULTIVITAMIN) capsule Take 1 tablet by mouth daily. ?Patient not taking: No sig reported 03/19/05   [provider]  ?nitroGLYCERIN (NITROSTAT) 0.4 MG SL tablet Place 0.4 mg under the tongue every 5 (five) minutes as needed for chest pain.    [provider]  ?QUEtiapine (SEROQUEL) 25 MG tablet Take by mouth. ?Patient not taking: No sig reported 02/01/20   [provider]  ?tamsulosin (FLOMAX) 0.4 MG CAPS Take 0.4 mg by mouth daily.    [provider]  ?venlafaxine XR (EFFEXOR-XR) 150 MG 24 hr capsule Take 150 mg by mouth daily.    [provider]  ?venlafaxine XR (EFFEXOR-XR) 150 MG 24 hr capsule Take 1 capsule by mouth daily. ?Patient not taking: No sig reported 01/10/20   [provider]  ?vitamin E 180 MG (400 UNITS) capsule Take 1 tablet by mouth daily. ?Patient not taking: No sig reported 03/19/05   [provider]  ?ziprasidone (GEODON) 20 MG capsule Take 20 mg by mouth 2 (two) times daily with a meal. 01/10/20   [provider]  ? ? ?Inpatient Medications: ?Scheduled Meds: ? aspirin EC  81 mg Oral Daily  ? feeding supplement  237 mL Oral BID BM  ? heparin  4,000 Units Intravenous Once  ? ?Continuous Infusions: ? ceFEPime (MAXIPIME) IV Stopped (04/19/21 16100638)  ? heparin    ? lactated ringers    ? lactated ringers 150 mL/hr at 04/18/21 2047  ? potassium PHOSPHATE IVPB (in mmol)    ? vancomycin    ? ?PRN Meds: ?acetaminophen, alum & mag hydroxide-simeth, ketorolac, ondansetron (ZOFRAN) IV ? ?Allergies:   No Known Allergies ? ?Social History:   ?Social History  ? ?Socioeconomic History  ? Marital status: Divorced  ?  Spouse name: Not on file  ? Number of children: Not on file  ? Years of education: Not on file  ? Highest education level: Not on file   ?Occupational History  ? Not on file  ?Tobacco Use  ? Smoking status: Never  ? Smokeless tobacco: Never  ?Vaping Use  ? Vaping Use: Never used  ?Substance and Sexual Activity  ? Alcohol use: Yes  ?  Comment: occasional  ? Drug use: No  ? Sexual activity: Not on file  ?Other Topics Concern  ? Not on file  ?Social History Narrative  ? Not on file  ? ?Social Determinants of Health  ? ?Financial Resource Strain: Not on file  ?Food Insecurity: Not on file  ?Transportation Needs: Not on file  ?Physical Activity: Not on file  ?Stress: Not on file  ?Social Connections: Not on file  ?Intimate Partner Violence: Not on file  ?  ?Family History:   ?History reviewed. No  pertinent family history. Patient unaware of any family history of cardiac issues.  ? ?ROS:  ?Please see the history of present illness.  ? ?All other ROS reviewed and negative.    ? ?Physical Exam/Data:  ? ?Vitals:  ? 04/19/21 0748 04/19/21 0800 04/19/21 0815 04/19/21 0900  ?BP:  (!) 80/59 (!) 113/57 (!) 90/48  ?Pulse:  60 65 (!) 56  ?Resp:  (!) 21 (!) 21 (!) 31  ?Temp: 97.7 ?F (36.5 ?C)     ?TempSrc: Axillary     ?SpO2:  95% 100% 96%  ?Weight:      ?Height:      ? ? ?Intake/Output Summary (Last 24 hours) at 04/19/2021 0915 ?Last data filed at 04/19/2021 4235 ?Gross per 24 hour  ?Intake 5319.61 ml  ?Output 501 ml  ?Net 4818.61 ml  ? ? ?  04/19/2021  ? 12:00 AM 04/18/2021  ?  8:31 PM 02/03/2021  ?  3:26 AM  ?Last 3 Weights  ?Weight (lbs) 251 lb 5.2 oz 257 lb 15 oz 257 lb 15 oz  ?Weight (kg) 114 kg 117 kg 117 kg  ?   ?Body mass index is 34.09 kg/m?.  ?General:  Pleasant elderly male appearing in no acute distress.  ?HEENT: normal ?Neck: no JVD ?Vascular: No carotid bruits; Distal pulses 2+ bilaterally ?Cardiac:  normal S1, S2; RRR; no murmur. ?Lungs: no wheezing or rales. ?Abd: soft, nontender, no hepatomegaly  ?Ext: no pitting edema ?Musculoskeletal:  No deformities, BUE and BLE strength normal and equal ?Skin: warm and dry  ?Neuro:  CNs 2-12 intact, no focal  abnormalities noted ?Psych:  Normal affect  ? 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  ? ? ?Relevant CV Studies: ? ?Echocardiogram: [place]

## 2021-04-19 NOTE — Assessment & Plan Note (Addendum)
-  BP meds will be held at this time due to soft BP ?-Per cardiology avoiding AV nodal agents given his Josue Hector ?-Continue to Monitor BP per Protocol ?-Last BP reading is 109/62 ?

## 2021-04-19 NOTE — Assessment & Plan Note (Addendum)
-   Patient's hemoglobin/hematocrit went from 14.7/43.5 ->12.9/39.4 ->11.7/34.9 -> 11.5/34.2 -> 11.2/33.3 -> 11.9/34.8 and is stable ?-Anemia Panel was checked and showed an iron level of 27, U IBC of 207, TIBC of 234, saturation ratios of 12, ferritin level of 185, folate level 10.6, vitamin B12 2092 ?-We will start p.o. Niferex 150 mg po Daily  ?-Continue to monitor for signs and symptoms of bleeding; had some evidence of bleeding from his urethral meatus yesterday and is improving  ?-Repeat CBC in a.m. ?

## 2021-04-19 NOTE — Assessment & Plan Note (Addendum)
-  AST 124, ALT 60 on admission, this is possibly due early shock liver/hypoperfusion in the setting of severe sepsis ?-Was Improving and AST is now 87 and ALT is 50 yesterday but today is slightly bumped and is now AST is 93 and ALT is 69 yesterday; now LFTs are trending back down his AST is 77 ALT 66 ?-We are checking a abdominal and pelvis CT scan with oral contrast given his abnormal renal function ?-His CT scan of the abdomen and pelvis done and showed "Cholelithiasis without pericholecystic inflammatory change. Liver unremarkable on this noncontrast examination. No intra or extrahepatic biliary ductal dilation" ?-We will consider an right upper quadrant ultrasound as well as a an acute hepatitis panel if continues to worsen in the morning ?-Continue monitor and trend and repeat CMP in within 1 week ?

## 2021-04-19 NOTE — Assessment & Plan Note (Addendum)
-  Platelets 94 on admitted and dropped down to 45 but is now trending back up in a 67 -> 71 ?-He was on a heparin drip but this not been discontinued given his bleeding ?-Repeating a CBC this afternoon given that he had some bleeding from his urethral meatus ?-Continue to monitor for signs and symptoms bleeding; no overt bleeding noted and will need to repeat his CBC within 1 week  ?

## 2021-04-19 NOTE — Hospital Course (Addendum)
HPI per Dr. Bernadette Hoit on 04/18/21 ?Mario Massey is a 76 y.o. male with medical history significant of hypertension, CAD, depression and bipolar disorder who presents to the emergency department via EMS due to 2-day history of vomiting and blood in Foley bag and was febrile on arrival to the ED with a temperature of 1018F.  Patient was unable to provide more detailed history, patient appears to have a reported dementia per ED medical record. ? ?ED course: ?In the emergency department, [name], tachycardic, febrile with a temperature of 103.18F, BP was 110/64, O2 sat was 96- 98%.  Work-up in the ED showed normal CBC except for WBC 1.5, platelets 94.  BMP was normal except for bicarb of 21 and BUN/creatinine 27/1.65 (baseline creatinine at 0.8-1.2), magnesium 1.4, phosphorous 2.3.  Lactic acid 2.9 > 3.0, troponin 110 > 655 > 2,362.  Influenza A, B, SARS coronavirus 2 was negative. ?Chest x-ray showed pulmonary hypoinflation ?Patient was treated with IV vancomycin, Flagyl and cefepime, IV hydration was provided.  Hospitalist was asked to admit patient for further evaluation and management. ? ?**Interim History ?Cardiology was consulted for further evaluation recommendations and they feel the patient had a NSTEMI related to his sepsis demand ischemia.  They are recommending a heparin drip.  He was also noted to be in a flutter with a new right bundle branch block but does subsequently had signs of Wenke block.  Cardiology recommends continuing antibiotics and avoiding AV nodal blocking agent due to his underlying condition.  Given that he continued of some abdominal discomfort and nausea vomiting we have ordered a CT scan of the abdomen pelvis without contrast given his renal function being worsened.  Patient was found to be septic secondary to UTI from caudae as well as as an E. coli bacteremia.  His Foley catheter was changed.  Patient had abnormal LFTs and abnormal AKI in the setting of his sepsis. ? ?His  echocardiogram was done and showed a normal systolic function.  His heparin drip was then discontinued given that he also had some bleeding from his urethral meatus where his Foley catheter was inserted and worsening thrombocytopenia.  Cardiology recommending planning for an ischemic evaluation once he recovers from sepsis and renal function stabilized which can be done in outpatient setting.  Cardiology recommends continuing aspirin 81 mg daily.  IV fluid hydration has now stopped. ? ?04/21/21: PT OT evaluated and they are recommending SNF.  Patient's WBC is trending down finally and is now 25.3.  Hemoglobin/hematocrit is stable and his hepatic function is also improving.  The bleeding from his urethral meatus is improved significantly. ? ?04/22/21: WBC is trending down and is now 18.7.  LFTs were trending down-but slightly bumped today so we will need to continue monitor him carefully.  Phos level is low at 2.4 so this was repleted.  PT OT evaluated and recommending SNF and patient is agreeable.  We will continue antibiotics for now ? ?04/23/2021: Patient is much improved and his liver numbers are trending down further.  He does have a slight anemia but is stable and his anemia panel shows that his iron level is low we will start him on iron supplementation.  Overall he is remarkably improved and denies any complaints and he is medically stable to be discharged to skilled nursing facility today as cardiology has cleared him from their perspective and recommending outpatient ischemic evaluation.  Daughter was updated and she had no further questions and he will be transported to his SNF for  continued rehabilitative efforts and will need outpatient urology follow-up as well as cardiology follow-up. ?

## 2021-04-19 NOTE — Progress Notes (Signed)
[facility] Notification  ?Your notification ID is: ?[phone] ?

## 2021-04-19 NOTE — Assessment & Plan Note (Addendum)
-  Albumin Level went from 3.6 -> 3.0 -> 2.7 -> 2.6 -> 2.5 x2, protein supplement will be provided ? ?

## 2021-04-19 NOTE — Assessment & Plan Note (Addendum)
-  SEVERE SEPSIS in the setting of E. coli bacteremia and subsequent CAUTI poA, improving  ?-Patient met sepsis criteria due to being febrile, tachypneic and tachycardic.  WBC was also 1.5 (less than 4) and this has since resulted in leukocytosis.  Patient's WBC is trended up to 22.9 and has now further trended up to 29.9 and he has endorgan damage with an abnormal LFTs and abnormal renal function now; WBC is improving and normalized at 10.5 ?-Urinalysis was suggestive of UTI as it showed a cloudy appearance with large hemoglobin, large leukocytes, many bacteria, greater than 50 RBCs per high-power field, and 50 WBCs with urine culture showing less than 10,000 colony-forming units of insignificant growth ?-Patient has lactic acidosis and lactic acid trended from 3 and trended down to 2.2 but then bumped to 2.7 and now normalized at 1.9 ?-He was started on IV vancomycin, Flagyl and cefepime.  His antibiotics were continued but but we de-escalated antibiotics to just IV ceftriaxone given that his E. coli is pansensitive; Now that he is being discharged will change to po Cefadroxil 1000 mg po BID  ?-IV hydration per sepsis protocol was provided.  -IV fluid hydration is not to stop ?-Urine culture pending but blood cultures resulted in the E. coli bacteremia ?-CT scan of the abdomen pelvis showed "No acute intra-abdominal pathology identified. No definite radiographic explanation for the patient's reported symptoms. Trace bilateral pleural effusions. Mild coronary artery calcification. Cholelithiasis. Pancolonic diverticulosis without superimposed acute inflammatory change. Minimal urolithiasis with 7 mm dependently layering calculus with the bladder." ?-Of note, urine culture done on 11/07/2020 was positive for E. coli and this was sensitive to cefepime. ?-Continue Tylenol as needed for fever ?-Foley catheter was changed and he gets his catheter changed every month and is changed about a month ago ?-Continue to monitor  and trend and sepsis physiology is improving significantly and resolving ?-CBC in the a.m. ?-PT OT evaluated given his generalized weakness and recommending SNF and he is agreeable to go ?

## 2021-04-19 NOTE — Progress Notes (Signed)
ANTICOAGULATION CONSULT NOTE - Initial Consult ? ?Pharmacy Consult for heparin gtt  ?Indication: chest pain/ACS ? ?No Known Allergies ? ?Patient Measurements: ?Height: 6' (182.9 cm) ?Weight: 114 kg (251 lb 5.2 oz) ?IBW/kg (Calculated) : 77.6 ?Heparin Dosing Weight: HEPARIN DW (KG): 103 ? ? ?Vital Signs: ?Temp: 97.7 ?F (36.5 ?C) (03/23 3845) ?Temp Source: Axillary (03/23 0748) ?BP: 80/59 (03/23 0800) ?Pulse Rate: 60 (03/23 0800) ? ?Labs: ?Recent Labs  ?  04/18/21 ?2029 04/19/21 ?0352  ?HGB 14.7 12.9*  ?HCT 43.5 39.4  ?PLT 94* 88*  ?APTT 27  --   ?LABPROT 13.7  --   ?INR 1.1  --   ?CREATININE 1.15 1.65*  ? ? ?Estimated Creatinine Clearance: 50.4 mL/min (A) (by C-G formula based on SCr of 1.65 mg/dL (H)). ? ? ?Medical History: ?Past Medical History:  ?Diagnosis Date  ? Bipolar disorder (HCC)   ? Coronary artery disease   ? Depression   ? Hypertension   ? ? ?Medications:  ?Medications Prior to Admission  ?Medication Sig Dispense Refill Last Dose  ? aspirin 81 MG chewable tablet aspirin 81 mg chewable tablet ? Chew 1 tablet every day by oral route.   Past Week  ? clotrimazole (LOTRIMIN) 1 % cream Apply to affected area 2 times daily 24 g 0 Past Week  ? guaifenesin (HUMIBID E) 400 MG TABS tablet Take 1 tablet by mouth 3 (three) times daily as needed.   Past Week  ? lisinopril (PRINIVIL,ZESTRIL) 5 MG tablet Take 5 mg by mouth daily.   Past Week  ? Multiple Vitamin (MULTIVITAMIN) capsule Take 1 tablet by mouth daily.   Past Week  ? nitroGLYCERIN (NITROSTAT) 0.4 MG SL tablet Place 0.4 mg under the tongue every 5 (five) minutes as needed for chest pain.   unk  ? vitamin E 180 MG (400 UNITS) capsule Take 1 tablet by mouth daily.     ? cephALEXin (KEFLEX) 500 MG capsule Take 1 capsule (500 mg total) by mouth 4 (four) times daily. (Patient not taking: Reported on 04/19/2021) 28 capsule 0 Completed Course  ? doxazosin (CARDURA) 8 MG tablet Take 8 mg by mouth at bedtime. (Patient not taking: Reported on 11/07/2020)   Not Taking  ?  DULoxetine (CYMBALTA) 20 MG capsule Take 20 mg by mouth daily. (Patient not taking: Reported on 11/07/2020)     ? finasteride (PROSCAR) 5 MG tablet Take 5 mg by mouth daily. (Patient not taking: Reported on 11/07/2020)   Not Taking  ? HYDROcodone-acetaminophen (NORCO/VICODIN) 5-325 MG tablet Take 1 tablet by mouth every 6 (six) hours as needed. (Patient not taking: Reported on 11/07/2020) 10 tablet 0 Not Taking  ? QUEtiapine (SEROQUEL) 25 MG tablet Take by mouth. (Patient not taking: Reported on 11/07/2020)   Not Taking  ? tamsulosin (FLOMAX) 0.4 MG CAPS Take 0.4 mg by mouth daily. (Patient not taking: Reported on 04/19/2021)   Not Taking  ? venlafaxine XR (EFFEXOR-XR) 150 MG 24 hr capsule Take 150 mg by mouth daily. (Patient not taking: Reported on 04/19/2021)   Not Taking  ? ziprasidone (GEODON) 20 MG capsule Take 20 mg by mouth 2 (two) times daily with a meal. (Patient not taking: Reported on 04/19/2021)   Not Taking  ? ?Scheduled:  ? feeding supplement  237 mL Oral BID BM  ? heparin  4,000 Units Intravenous Once  ? ?Infusions:  ? ceFEPime (MAXIPIME) IV Stopped (04/19/21 3646)  ? heparin    ? lactated ringers    ? lactated ringers 150  mL/hr at 04/18/21 2047  ? potassium PHOSPHATE IVPB (in mmol)    ? vancomycin    ? ?PRN: acetaminophen, alum & mag hydroxide-simeth, ketorolac, ondansetron (ZOFRAN) IV ?Anti-infectives (From admission, onward)  ? ? Start     Dose/Rate Route Frequency Ordered Stop  ? 04/19/21 2000  vancomycin (VANCOREADY) IVPB 1500 mg/300 mL       ? 1,500 mg ?150 mL/hr over 120 Minutes Intravenous Every 24 hours 04/18/21 2151    ? 04/19/21 0600  ceFEPIme (MAXIPIME) 2 g in sodium chloride 0.9 % 100 mL IVPB       ? 2 g ?200 mL/hr over 30 Minutes Intravenous Every 8 hours 04/18/21 2143    ? 04/18/21 2130  vancomycin (VANCOREADY) IVPB 1500 mg/300 mL       ? 1,500 mg ?150 mL/hr over 120 Minutes Intravenous  Once 04/18/21 2116 04/19/21 0716  ? 04/18/21 2045  ceFEPIme (MAXIPIME) 2 g in sodium chloride 0.9 % 100  mL IVPB       ? 2 g ?200 mL/hr over 30 Minutes Intravenous  Once 04/18/21 2038 04/18/21 2127  ? 04/18/21 2045  metroNIDAZOLE (FLAGYL) IVPB 500 mg       ? 500 mg ?100 mL/hr over 60 Minutes Intravenous  Once 04/18/21 2038 04/18/21 2132  ? 04/18/21 2045  vancomycin (VANCOCIN) IVPB 1000 mg/200 mL premix       ? 1,000 mg ?200 mL/hr over 60 Minutes Intravenous  Once 04/18/21 2038 04/18/21 2236  ? ?  ? ? ?Assessment: ?Mario Massey a 76 y.o. male requires anticoagulation with a heparin iv infusion for the indication of  chest pain/ACS. Heparin gtt will be started following pharmacy protocol per pharmacy consult. Patient is not on previous oral anticoagulant that will require aPTT/HL correlation before transitioning to only HL monitoring.  ? ?Goal of Therapy:  ?Heparin level 0.3-0.7 units/ml ?Monitor platelets by anticoagulation protocol: Yes ?  ?Plan:  ?Give 4000 units bolus x 1 ?Start heparin infusion at 1200 units/hr ?Check anti-Xa level in 8 hours and daily while on heparin ?Continue to monitor H&H and platelets ? ?Mario Massey ?04/19/2021,9:03 AM ? m ?

## 2021-04-19 NOTE — Assessment & Plan Note (Addendum)
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

## 2021-04-19 NOTE — Assessment & Plan Note (Addendum)
-  Phosphorous is now 2.4 -> 3.2 ?-Continue monitor and replete as necessary ?-Repeat phosphorus level within 1 week  ?

## 2021-04-19 NOTE — Assessment & Plan Note (Addendum)
-  WBC 1.5, this has resulted in leukocytosis in a repeat CBC and trended up to 22.9 -> 29.9 -> 28.1 -> 25.3 -> 18.7 -> 10.5 ?-Continue management as described for severe sepsis ?

## 2021-04-19 NOTE — Assessment & Plan Note (Addendum)
-  Continue aspirin 81 mg daily ?-Nitroglycerin will be held at this time due to soft BP ?-Patient will was not on any statins per med rec and will not start now given his abnormal LFTs.  Cardiology is planning for a cardiac catheterization later during this admission but now they are saying that they can defer to the outpatient setting for ischemic evaluation ?

## 2021-04-19 NOTE — Assessment & Plan Note (Addendum)
-  Mg level is now 1.9 ?-Continue to monitor and replete as necessary ?-Repeat magnesium level in the a.m. ? ?

## 2021-04-19 NOTE — Progress Notes (Signed)
ANTICOAGULATION CONSULT NOTE - Follow Up Consult ? ?Pharmacy Consult for heparin gtt ?Indication: chest pain/ACS ? ?No Known Allergies ? ?Patient Measurements: ?Height: 6' (182.9 cm) ?Weight: 114 kg (251 lb 5.2 oz) ?IBW/kg (Calculated) : 77.6 ?Heparin Dosing Weight: 103 ? ?Vital Signs: ?Temp: 97.7 ?F (36.5 ?C) (03/23 1148) ?Temp Source: Oral (03/23 1148) ?BP: 109/48 (03/23 1834) ?Pulse Rate: 46 (03/23 1834) ? ?Labs: ?Recent Labs  ?  04/18/21 ?2029 04/18/21 ?2234 04/19/21 ?3151 04/19/21 ?7616 04/19/21 ?0737 04/19/21 ?1823  ?HGB 14.7  --  12.9*  --   --   --   ?HCT 43.5  --  39.4  --   --   --   ?PLT 94*  --  88*  --   --   --   ?APTT 27  --   --   --   --   --   ?LABPROT 13.7  --   --   --   --   --   ?INR 1.1  --   --   --   --   --   ?HEPARINUNFRC  --   --   --   --   --  0.64  ?CREATININE 1.15  --  1.65*  --   --   --   ?TROPONINIHS 110*   < > 2,362* 2,133* 1,895*  --   ? < > = values in this interval not displayed.  ? ? ?Estimated Creatinine Clearance: 50.4 mL/min (A) (by C-G formula based on SCr of 1.65 mg/dL (H)). ? ? ?Medications:  ?Scheduled:  ? aspirin EC  81 mg Oral Daily  ? Chlorhexidine Gluconate Cloth  6 each Topical Daily  ? feeding supplement  237 mL Oral BID BM  ? venlafaxine XR  150 mg Oral Daily  ? ziprasidone  20 mg Oral Daily  ? ? ?Assessment: ?Mario Massey a 76 y.o. male requires anticoagulation with a heparin iv infusion for the indication of  chest pain/ACS. His heparin level is therapeutic at 0.64. ? ?Goal of Therapy:  ?Heparin level 0.3-0.7 units/ml ?Monitor platelets by anticoagulation protocol: Yes ?  ?Plan:  ?Continue heparin at 1200 units/hr ?Repeat anti-Xa level in 8 hours and daily while on heparin ?Continue to monitor H&H and platelets ? ?[name] ?04/19/2021,7:13 PM ? ? ?

## 2021-04-19 NOTE — Assessment & Plan Note (Addendum)
This is possibly secondary to type II demand ischemia in the setting of severe sepsis and E. coli bacteremia ?-Cardiology is ordering echocardiogram to assess for any structural abnormalities and this was done and showed a normal systolic function as it showed "Left ventricular ejection fraction, by estimation, is 55 to 60%. The left ventricle has normal function. The left ventricle has no regional  wall motion abnormalities. There is moderate left ventricular hypertrophy. Left ventricular diastolic  parameters are indeterminate" ?-Troponin 110 > 655 > 2,362 > 2133 and trended down to [place]; this has started to trend downwards ?-Cardiologist (Dr. Toney Rakes) on-call at [facility] was consulted and he reassured that this was possibly still due to type II demand ischemia considering patient's clinical picture of being septic. ?-Given ASA 325 mg x 1 was given is now been started on aspirin 81 mg p.o. daily and will continue ?-Cardiology has been consulted and recommending no beta-blocker due to intermittent bradycardia and hypotension and no statin given his abnormal LFTs.  They are planning for a cardiac catheterization at a later time during this admission once he is recovered from his sepsis and once his renal function stabilizes ?-He was on a heparin drip but this has been discontinued given his bleeding and given his normal systolic function ?-Per cardiology he will need an outpatient ischemic evaluation ?

## 2021-04-19 NOTE — Assessment & Plan Note (Addendum)
-  Patient's T. bili trended up to 1.3 is now normalized at 0.8 x2 -> 0.6 ?-Likely reactive ?-IV fluid hydration has now stopped ?-Repeat CMP in a.m. ?

## 2021-04-19 NOTE — Progress Notes (Signed)
?PROGRESS NOTE ? ? ? ?Mario Massey  QXI:[id number] DOB: 08-May-1945 DOA: 04/18/2021 ?PCP: [facility]  ? ?Brief Narrative:  ?HPI per Dr. Bernadette Hoit on 04/18/21 ?Mario Massey is a 76 y.o. male with medical history significant of hypertension, CAD, depression and bipolar disorder who presents to the emergency department via EMS due to 2-day history of vomiting and blood in Foley bag and was febrile on arrival to the ED with a temperature of 103F.  Patient was unable to provide more detailed history, patient appears to have a reported dementia per ED medical record. ? ?ED course: ?In the emergency department, [name], tachycardic, febrile with a temperature of 103.3F, BP was 110/64, O2 sat was 96- 98%.  Work-up in the ED showed normal CBC except for WBC 1.5, platelets 94.  BMP was normal except for bicarb of 21 and BUN/creatinine 27/1.65 (baseline creatinine at 0.8-1.2), magnesium 1.4, phosphorous 2.3.  Lactic acid 2.9 > 3.0, troponin 110 > 655 > 2,362.  Influenza A, B, SARS coronavirus 2 was negative. ?Chest x-ray showed pulmonary hypoinflation ?Patient was treated with IV vancomycin, Flagyl and cefepime, IV hydration was provided.  Hospitalist was asked to admit patient for further evaluation and management. ? ?**Interim History ?Cardiology was consulted for further evaluation recommendations and they feel the patient had a NSTEMI related to his sepsis demand ischemia.  They are recommending a heparin drip.  He was also noted to be in a flutter with a new right bundle branch block but does subsequently had signs of Wenke block.  Cardiology recommends continuing antibiotics and avoiding AV nodal blocking agent due to his underlying condition.  Given that he continued of some abdominal discomfort and nausea vomiting we have ordered a CT scan of the abdomen pelvis without contrast given his renal function being worsened.  Patient was found to be septic secondary to UTI from caudae as well as as an E.  coli bacteremia.  His Foley catheter was changed.  Patient had abnormal LFTs and abnormal AKI in the setting of his sepsis.  ? ? ?Assessment and Plan: ?* Sepsis secondary to UTI [facility]) ?-SEVERE SEPSIS in the setting of E. coli bacteremia and subsequent Khary UTI, poA ?Patient met sepsis criteria due to being febrile, tachypneic and tachycardic.  WBC was also 1.5 (less than 4) and this has since resulted in leukocytosis.  Patient's WBC is trended up to 22.9 and he has endorgan damage with an abnormal LFTs and abnormal renal function now ?-Urinalysis was suggestive of UTI as it showed a cloudy appearance with large hemoglobin, large leukocytes, many bacteria, greater than 50 RBCs per high-power field, and 50 WBCs with urine culture pending ?-Patient has lactic acidosis and lactic acid trended from 3 and trended down to 2.2 but then bumped to 2.7 ?-He was started on IV vancomycin, Flagyl and cefepime.  We will continue with same at this time but will de-escalate antibiotics to just IV ceftriaxone given that his E. coli is pansensitive ?-IV hydration per sepsis protocol was provided.  -Continue IV hydration ?-Urine culture pending but blood cultures resulted in the E. coli bacteremia ?-Of note, urine culture done on 11/07/2020 was positive for E. coli and this was sensitive to cefepime. ?-Continue Tylenol as needed for fever ?-Foley catheter was changed and he gets his catheter changed every month and is changed about a month ago ? ?NSTEMI (non-ST elevated myocardial infarction) ([place]) ?This is possibly secondary to type II demand ischemia in the setting of severe sepsis and E. coli  bacteremia ?-Cardiology is ordering echocardiogram to assess for any structural abnormalities and this is pending to be done ?-Troponin 110 > 655 > 2,362 > 2133 and trended down to [place]; this has started to trend downwards ?-Cardiologist (Dr. Toney Rakes) on-call at [facility] was consulted and he reassured that this was possibly still due to type II  demand ischemia considering patient's clinical picture of being septic. ?-Given ASA 325 mg x 1 was given is now been started on aspirin 81 mg p.o. daily ?-Cardiology has been consulted and recommending no beta-blocker due to intermittent bradycardia and hypotension and no statin given his abnormal LFTs.  They are planning for a cardiac catheterization at a later time during this admission once he is recovered from his sepsis and once his renal function stabilizes ? ?AKI (acute kidney injury) ([place]) ?Metabolic Acidosis ?-BUN/creatinine 27/1.65 (baseline creatinine at 0.8-1.2 ?-Continue IV hydration as delineated as above ?-Patient has a slight metabolic acidosis with a CO2 of 21, anion gap of 11, chloride level of 105 ?-Renally adjust medications, avoid nephrotoxic agents/dehydration/hypotension ?-Continue monitor and trend renal function carefully and repeat CMP in the a.m. ? ? ?Lactic acidosis ?-Lactic acid 2.9 > 3.0 -> 2.2 -> 2.7 ?-Continue IV hydration ?-Continue to trend lactic acid ?-Continue with IV fluid hydration and he received a bolus fluids and also maintenance fluids which have expired and now resumed at 75 MLS per hour ? ?Hypophosphatemia ?-Phosphorous 2.3, this was replenished ?-Continue monitor and replete as necessary ?-Repeat phosphorus level in a.m. ? ?Hypomagnesemia ?-Mg level is 1.4 ?-Mag level was replete ?-Continue to monitor and replete as necessary ?-Repeat magnesium level in the a.m. ? ? ?Thrombocytopenia ([place]) ?-Platelets 94, this has since decreased to 88 ?-Anticoagulants were initially held but have been started on a heparin drip with close monitoring ?-Continue to monitor for signs and symptoms bleeding; no overt bleeding noted and will need to repeat his CBC in a.m. ? ?Normocytic anemia ?- Patient's hemoglobin/hematocrit went from 14.7/43.5 and likely was hemoconcentrated on admission is now 12.9/39.4 after IV for hydration ?Likely dilutional drop ?-Check anemia panel in the  a.m. ?-Continue to monitor for signs and symptoms of bleeding; currently no overt bleeding noted ?-Repeat CBC in a.m. ? ?Hyperbilirubinemia ?-Patient's T. bili trended up from 1.1 is now 1.3 ?-Likely reactive ?-Continue with IV fluid hydration with normal saline ?-Repeat CMP in a.m. ? ?Wenckebach ?- Noted to be on EKG and telemetry during admission ?-Per cardiology he has had prior conduction disease but his right bundle branch appears to be new as well ?-They are recommending continue to follow telemetry and avoiding AV nodal blocking agents ? ?Nausea & vomiting ?- In the setting of his E. coli bacteremia and infection ?-Checking a CT scan of the abdomen pelvis without IV contrast but with oral contrast ?-Continue supportive care and antiemetics and added Compazine as needed ? ? ?CAD (coronary artery disease) ?-Continue aspirin 81 mg daily ?-Nitroglycerin will be held at this time due to soft BP ?-Patient will was not on any statins per med rec and will not start now given his abnormal LFTs.  Cardiology is planning for a cardiac catheterization later during this admission ? ?Essential hypertension ?-BP meds will be held at this time due to soft BP ?-Per cardiology avoiding AV nodal agents given his Colen Darling ? ?Transaminitis ?-AST 124, ALT 60, this is possibly due early shock liver/hypoperfusion in the setting of severe sepsis ?-We are checking a abdominal and pelvis CT scan with oral contrast given his abnormal  renal function ?-We will consider an right upper quadrant ultrasound as well as a an acute hepatitis panel if continues to be elevated ?-Continue monitor and trend and repeat CMP in a.m. ? ?Hypoalbuminemia due to protein-calorie malnutrition ([place]) ?-Albumin 3.0, protein supplement will be provided ? ? ?Leukopenia ?-WBC 1.5, this has resulted in leukocytosis in a repeat CBC and trended up to 22.9 ?-Continue management as described for severe sepsis ? ? ?DVT prophylaxis: SCDs Start: 04/19/21 0018; heparin  drip ? ?  Code Status: Full Code ?Family Communication: No family currently at bedside ? ?Disposition Plan:  ?Level of care: Stepdown ?Status is: Inpatient ?Remains inpatient appropriate because: Needs further cardiac w

## 2021-04-19 NOTE — Assessment & Plan Note (Addendum)
Metabolic Acidosis ?-BUN/creatinine 27/1.65 (baseline creatinine at 0.8-1.2 and improved to 20/0.63 now ?-Patient's metabolic acidosis is improved and he has a CO2 of 27, anion gap of 3, chloride level 107  now ?-IV fluid hydration is stopped ?-Renal ultrasound done and showed no hydronephrosis; will order a bladder scan ?-Renally adjust medications, avoid nephrotoxic agents/dehydration/hypotension ?-Continue monitor and trend renal function carefully and repeat CMP within 1 week  ? ?

## 2021-04-20 ENCOUNTER — Inpatient Hospital Stay (HOSPITAL_COMMUNITY): Payer: No Typology Code available for payment source

## 2021-04-20 DIAGNOSIS — I248 Other forms of acute ischemic heart disease: Secondary | ICD-10-CM

## 2021-04-20 DIAGNOSIS — R778 Other specified abnormalities of plasma proteins: Secondary | ICD-10-CM

## 2021-04-20 DIAGNOSIS — N368 Other specified disorders of urethra: Secondary | ICD-10-CM

## 2021-04-20 LAB — URINE CULTURE: Culture: <10000 — AB

## 2021-04-20 LAB — CBC WITH DIFFERENTIAL/PLATELET
Band Neutrophils: 16 %
Basophils Absolute: 0 10*3/uL (ref 0.0–0.1)
Basophils Relative: 0 %
Eosinophils Absolute: 0.3 10*3/uL (ref 0.0–0.5)
Eosinophils Relative: 1 %
HCT: 35.1 % — ABNORMAL LOW (ref 39.0–52.0)
Hemoglobin: 11.7 g/dL — ABNORMAL LOW (ref 13.0–17.0)
Lymphocytes Relative: 3 %
Lymphs Abs: 0.8 10*3/uL (ref 0.7–4.0)
MCH: 31.8 pg (ref 26.0–34.0)
MCHC: 33.3 g/dL (ref 30.0–36.0)
MCV: 95.4 fL (ref 80.0–100.0)
Metamyelocytes Relative: 2 %
Monocytes Absolute: 0.8 10*3/uL (ref 0.1–1.0)
Monocytes Relative: 3 %
Neutro Abs: 25.6 10*3/uL — ABNORMAL HIGH (ref 1.7–7.7)
Neutrophils Relative %: 75 %
Platelets: 45 10*3/uL — ABNORMAL LOW (ref 150–400)
RBC: 3.68 MIL/uL — ABNORMAL LOW (ref 4.22–5.81)
RDW: 13.3 % (ref 11.5–15.5)
WBC: 28.1 10*3/uL — ABNORMAL HIGH (ref 4.0–10.5)
nRBC: 0 % (ref 0.0–0.2)

## 2021-04-20 LAB — PHOSPHORUS: Phosphorus: 3.1 mg/dL (ref 2.5–4.6)

## 2021-04-20 LAB — ECHOCARDIOGRAM COMPLETE
AR max vel: 2.61 cm2
AV Area VTI: 2.19 cm2
AV Area mean vel: 2.72 cm2
AV Mean grad: 3 mmHg
AV Peak grad: 7.5 mmHg
Ao pk vel: 1.37 m/s
Area-P 1/2: 3.48 cm2
Calc EF: 55.1 %
Height: 72 in
MV VTI: 1.89 cm2
P 1/2 time: 582 msec
S' Lateral: 3.5 cm
Single Plane A2C EF: 45.5 %
Single Plane A4C EF: 63.7 %
Weight: 4091.74 oz

## 2021-04-20 LAB — CBC
HCT: 34.9 % — ABNORMAL LOW (ref 39.0–52.0)
Hemoglobin: 11.7 g/dL — ABNORMAL LOW (ref 13.0–17.0)
MCH: 32.6 pg (ref 26.0–34.0)
MCHC: 33.5 g/dL (ref 30.0–36.0)
MCV: 97.2 fL (ref 80.0–100.0)
Platelets: 58 10*3/uL — ABNORMAL LOW (ref 150–400)
RBC: 3.59 MIL/uL — ABNORMAL LOW (ref 4.22–5.81)
RDW: 13.3 % (ref 11.5–15.5)
WBC: 29.9 10*3/uL — ABNORMAL HIGH (ref 4.0–10.5)
nRBC: 0 % (ref 0.0–0.2)

## 2021-04-20 LAB — MAGNESIUM: Magnesium: 2.1 mg/dL (ref 1.7–2.4)

## 2021-04-20 LAB — COMPREHENSIVE METABOLIC PANEL
ALT: 57 U/L — ABNORMAL HIGH (ref 0–44)
AST: 119 U/L — ABNORMAL HIGH (ref 15–41)
Albumin: 2.7 g/dL — ABNORMAL LOW (ref 3.5–5.0)
Alkaline Phosphatase: 54 U/L (ref 38–126)
Anion gap: 9 (ref 5–15)
BUN: 30 mg/dL — ABNORMAL HIGH (ref 8–23)
CO2: 24 mmol/L (ref 22–32)
Calcium: 7.6 mg/dL — ABNORMAL LOW (ref 8.9–10.3)
Chloride: 102 mmol/L (ref 98–111)
Creatinine, Ser: 0.99 mg/dL (ref 0.61–1.24)
GFR, Estimated: >60 mL/min (ref >60)
Glucose, Bld: 69 mg/dL — ABNORMAL LOW (ref 70–99)
Potassium: 4.3 mmol/L (ref 3.5–5.1)
Sodium: 135 mmol/L (ref 135–145)
Total Bilirubin: 0.7 mg/dL (ref 0.3–1.2)
Total Protein: 5.7 g/dL — ABNORMAL LOW (ref 6.5–8.1)

## 2021-04-20 LAB — HEPARIN LEVEL (UNFRACTIONATED)
Heparin Unfractionated: 0.58 IU/mL (ref 0.30–0.70)
Heparin Unfractionated: 0.47 IU/mL (ref 0.30–0.70)

## 2021-04-20 LAB — LACTIC ACID, PLASMA: Lactic Acid, Venous: 1.9 mmol/L (ref 0.5–1.9)

## 2021-04-20 MED ORDER — BACITRACIN ZINC 500 UNIT/GM EX OINT
TOPICAL_OINTMENT | Freq: Two times a day (BID) | CUTANEOUS | Status: DC
  Administered 2021-04-21: 1 via TOPICAL
  Filled 2021-04-20: qty 28.35

## 2021-04-20 NOTE — Assessment & Plan Note (Addendum)
-  He has a indwelling Foley catheter and this was changed a few days ago. ?-Likely worsened in the setting of his heparin drip and likely bleeding from his bladder and not his prostate ?-Heparin drip has now stopped ?-I spoke with urology who feels that the patient does not need to formally consult but recommended bacitracin to the urethral meatus and close monitoring ?-Bleeding is improving and platelet count is also improved ?-Follow up with Cardiology as an outpatient  ? ? ? ?

## 2021-04-20 NOTE — Progress Notes (Signed)
? ?Progress Note ? ?Patient Name: Mario Massey ?Date of Encounter: 04/20/2021 ? ?[facility] Cardiologist: None  ? ?Subjective  ? ?BP 115/49 this morning.  Labs not drawn this morning.  He denies any chest pain or dyspnea. ? ?Inpatient Medications  ?  ?Scheduled Meds: ? aspirin EC  81 mg Oral Daily  ? Chlorhexidine Gluconate Cloth  6 each Topical Daily  ? feeding supplement  237 mL Oral BID BM  ? venlafaxine XR  150 mg Oral Daily  ? ziprasidone  20 mg Oral Daily  ? ?Continuous Infusions: ? cefTRIAXone (ROCEPHIN)  IV Stopped (04/19/21 1313)  ? heparin 1,200 Units/hr (04/20/21 0700)  ? lactated ringers Stopped (04/19/21 2209)  ? ?PRN Meds: ?acetaminophen, alum & mag hydroxide-simeth, ondansetron (ZOFRAN) IV, prochlorperazine  ? ?Vital Signs  ?  ?Vitals:  ? 04/20/21 0600 04/20/21 0700 04/20/21 0800 04/20/21 0900  ?BP: (!) 102/51 (!) 121/57 (!) 123/47 (!) 115/49  ?Pulse: (!) 110 (!) 59 (!) 58 69  ?Resp: (!) 26 20 20 19   ?Temp:  97.6 ?F (36.4 ?C)    ?TempSrc:  Oral    ?SpO2: 95% 97% 93% 94%  ?Weight:      ?Height:      ? ? ?Intake/Output Summary (Last 24 hours) at 04/20/2021 0940 ?Last data filed at 04/20/2021 0700 ?Gross per 24 hour  ?Intake 2476.95 ml  ?Output 1650 ml  ?Net 826.95 ml  ? ? ?  04/20/2021  ?  4:55 AM 04/19/2021  ? 12:00 AM 04/18/2021  ?  8:31 PM  ?Last 3 Weights  ?Weight (lbs) 255 lb 11.7 oz 251 lb 5.2 oz 257 lb 15 oz  ?Weight (kg) 116 kg 114 kg 117 kg  ?   ? ?Telemetry  ?  ?Normal sinus rhythm in 60s- Personally Reviewed ? ?ECG  ?  ?No new ECG- Personally Reviewed ? ?Physical Exam  ? ?GEN: No acute distress.   ?Neck: +JVD ?Cardiac: RRR, no murmurs, rubs, or gallops.  ?Respiratory: Clear to auscultation bilaterally. ?GI: Soft, nontender, non-distended  ?MS: No edema; No deformity. ?Neuro:  Nonfocal  ?Psych: Normal affect  ? ?Labs  ?  ?High Sensitivity Troponin:   ?Recent Labs  ?Lab 04/18/21 ?2029 04/18/21 ?2234 04/19/21 ?[id number] 04/19/21 ?[id number] 04/19/21 ?0755  ?TROPONINIHS 110* 655* 2,362* 2,133* 1,895*  ?    ?Chemistry ?Recent Labs  ?Lab 04/18/21 ?2029 04/19/21 ?0352  ?NA 139 137  ?K 4.0 3.5  ?CL 103 105  ?CO2 25 21*  ?GLUCOSE 109* 95  ?BUN 23 27*  ?CREATININE 1.15 1.65*  ?CALCIUM 8.9 8.4*  ?MG  --  1.4*  ?PROT 6.7 5.8*  ?ALBUMIN 3.6 3.0*  ?AST 27 124*  ?ALT 20 60*  ?ALKPHOS 98 61  ?BILITOT 1.1 1.3*  ?GFRNONAA >60 43*  ?ANIONGAP 11 11  ?  ?Lipids No results for input(s): CHOL, TRIG, HDL, LABVLDL, LDLCALC, CHOLHDL in the last 168 hours.  ?Hematology ?Recent Labs  ?Lab 04/18/21 ?2029 04/19/21 ?0352 04/20/21 ?0241  ?WBC 1.5* 22.9* 29.9*  ?RBC 4.51 4.14* 3.59*  ?HGB 14.7 12.9* 11.7*  ?HCT 43.5 39.4 34.9*  ?MCV 96.5 95.2 97.2  ?MCH 32.6 31.2 32.6  ?MCHC 33.8 32.7 33.5  ?RDW 12.9 13.1 13.3  ?PLT 94* 88* 58*  ? ?Thyroid No results for input(s): TSH, FREET4 in the last 168 hours.  ?BNPNo results for input(s): BNP, PROBNP in the last 168 hours.  ?DDimer No results for input(s): DDIMER in the last 168 hours.  ? ?Radiology  ?  ?CT ABDOMEN PELVIS  WO CONTRAST ? ?Result Date: 04/19/2021 ?CLINICAL DATA:  Sepsis. Fever, vomiting, hematuria. Abdominal pain, acute, nonlocalized EXAM: CT ABDOMEN AND PELVIS WITHOUT CONTRAST TECHNIQUE: Multidetector CT imaging of the abdomen and pelvis was performed following the standard protocol without IV contrast. RADIATION DOSE REDUCTION: This exam was performed according to the departmental dose-optimization program which includes automated exposure control, adjustment of the mA and/or kV according to patient size and/or use of iterative reconstruction technique. COMPARISON:  None FINDINGS: Lower chest: Trace bilateral pleural effusions with associated bibasilar atelectasis. Mild coronary artery calcification. Global cardiac size within normal limits. Hepatobiliary: Cholelithiasis without pericholecystic inflammatory change. Liver unremarkable on this noncontrast examination. No intra or extrahepatic biliary ductal dilation. Pancreas: Unremarkable Spleen: Unremarkable Adrenals/Urinary Tract: The  adrenal glands are unremarkable. The kidneys are normal on this noncontrast examination. Foley catheter balloon is seen within a decompressed bladder lumen. Dependently layering 8 mm calculus is seen within the decompressed bladder lumen. Stomach/Bowel: Moderate to severe pancolonic diverticulosis without superimposed acute inflammatory change. Stomach, small bowel, and large bowel are otherwise unremarkable. No evidence of obstruction or focal inflammation. Appendix normal. No free intraperitoneal gas or fluid. Vascular/Lymphatic: No significant vascular findings are present. No enlarged abdominal or pelvic lymph nodes. Reproductive: Prostate is unremarkable. Other: No abdominal wall hernia.  Rectum unremarkable. Musculoskeletal: No acute bone abnormality. No lytic or blastic bone lesions. IMPRESSION: No acute intra-abdominal pathology identified. No definite radiographic explanation for the patient's reported symptoms. Trace bilateral pleural effusions. Mild coronary artery calcification. Cholelithiasis. Pancolonic diverticulosis without superimposed acute inflammatory change. Minimal urolithiasis with 7 mm dependently layering calculus with the bladder. Electronically Signed   By: Fidela Salisbury M.D.   On: 04/19/2021 20:04  ? ?DG Chest Portable 1 View ? ?Result Date: 04/18/2021 ?CLINICAL DATA:  Fever EXAM: PORTABLE CHEST 1 VIEW COMPARISON:  08/16/2020 FINDINGS: Lung volumes are small, but are symmetric and are clear. No pneumothorax or pleural effusion. Cardiac size within normal limits when accounting for poor pulmonary insufflation. Vascular crowding at the hila. No acute bone abnormality. Degenerative changes noted within the shoulders bilaterally. IMPRESSION: Pulmonary hypoinflation. Electronically Signed   By: Fidela Salisbury M.D.   On: 04/18/2021 21:25   ? ?Cardiac Studies  ? ?Echo 04/20/21: ? 1. Left ventricular ejection fraction, by estimation, is 55 to 60%. The  ?left ventricle has normal function. The left  ventricle has no regional  ?wall motion abnormalities. There is moderate left ventricular hypertrophy.  ?Left ventricular diastolic  ?parameters are indeterminate.  ? 2. Right ventricular systolic function is normal. The right ventricular  ?size is mildly enlarged. There is mildly elevated pulmonary artery  ?systolic pressure. The estimated right ventricular systolic pressure is  ?0000000 mmHg.  ? 3. Left atrial size was moderately dilated.  ? 4. Right atrial size was mildly dilated.  ? 5. The mitral valve is normal in structure. Trivial mitral valve  ?regurgitation. No evidence of mitral stenosis.  ? 6. The aortic valve is tricuspid. Aortic valve regurgitation is mild. No  ?aortic stenosis is present.  ? 7. Aortic dilatation noted. There is dilatation of the ascending aorta,  ?measuring 40 mm.  ? ?Patient Profile  ?   ?76 y.o. male with a hx of CAD, HTN, HLD and Bipolar Disorder who is being seen 04/19/2021 for the evaluation of elevated troponin values and Wenckebach  ? ?Assessment & Plan  ?  ?NSTEMI: Presented with nausea, vomiting and hematuria and found to have urosepsis and currently on antibiotic therapy. Cardiology consulted as [facility] Troponin  values have been elevated at 110, 655, 2362 and 2133. Possibly secondary to demand ischemia in the setting of Urosepsis but he does report fatigue and dyspnea on exertion prior to admission. ?-Echo today shows normal systolic function ?-Started heparin gtt, would favor stopping given worsening thrombocytopenia and he is denying chest pain and echo with normal systolic function ?-Continue ASA 81mg  daily.  ?-No statin for now due to elevated LFT's ?-no BB due to intermittent bradycardia and hypotension.  ?-Would likely plan for ischemia evaluation once he has recovered from sepsis and renal function stabilizes.  Can be done as outpatient ?  ?HTN: He was on Lisinopril prior to admission but currently held secondary to hypotension. Receiving IV fluids but would be cautious with  this.  Appears mildly volume overloaded on exam.  Echo with normal systolic function ?  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

## 2021-04-20 NOTE — Progress Notes (Signed)
?PROGRESS NOTE ? ? ? ?Mario Massey  YIR:[id number] DOB: [birth year] DOA: 04/18/2021 ?PCP: [facility]  ? ?Brief Narrative:  ?HPI per Dr. Bernadette Hoit on 04/18/21 ?Mario Massey is a 76 y.o. male with medical history significant of hypertension, CAD, depression and bipolar disorder who presents to the emergency department via EMS due to 2-day history of vomiting and blood in Foley bag and was febrile on arrival to the ED with a temperature of 1084F.  Patient was unable to provide more detailed history, patient appears to have a reported dementia per ED medical record. ? ?ED course: ?In the emergency department, [name], tachycardic, febrile with a temperature of 103.84F, BP was 110/64, O2 sat was 96- 98%.  Work-up in the ED showed normal CBC except for WBC 1.5, platelets 94.  BMP was normal except for bicarb of 21 and BUN/creatinine 27/1.65 (baseline creatinine at 0.8-1.2), magnesium 1.4, phosphorous 2.3.  Lactic acid 75 > 3.0, troponin 110 > 655 > 2,362.  Influenza A, B, SARS coronavirus 2 was negative. ?Chest x-ray showed pulmonary hypoinflation ?Patient was treated with IV vancomycin, Flagyl and cefepime, IV hydration was provided.  Hospitalist was asked to admit patient for further evaluation and management. ? ?**Interim History ?Cardiology was consulted for further evaluation recommendations and they feel the patient had a NSTEMI related to his sepsis demand ischemia.  They are recommending a heparin drip.  He was also noted to be in a flutter with a new right bundle branch block but does subsequently had signs of Wenke block.  Cardiology recommends continuing antibiotics and avoiding AV nodal blocking agent due to his underlying condition.  Given that he continued of some abdominal discomfort and nausea vomiting we have ordered a CT scan of the abdomen pelvis without contrast given his renal function being worsened.  Patient was found to be septic secondary to UTI from caudae as well as as an E.  coli bacteremia.  His Foley catheter was changed.  Patient had abnormal LFTs and abnormal AKI in the setting of his sepsis. ? ?His echocardiogram was done and showed a normal systolic function.  His heparin drip was then discontinued given that he also had some bleeding from his urethral meatus where his Foley catheter was inserted and worsening thrombocytopenia.  Cardiology recommending planning for an ischemic evaluation once he recovers from sepsis and renal function stabilized which can be done in outpatient setting.  Cardiology recommends continuing aspirin 81 mg daily.  IV fluid hydration has now stopped.  ? ? ?Assessment and Plan: ?* Sepsis secondary to UTI [name]) ?-SEVERE SEPSIS in the setting of E. coli bacteremia and subsequent Khary UTI, poA ?Patient met sepsis criteria due to being febrile, tachypneic and tachycardic.  WBC was also 1.5 (less than 4) and this has since resulted in leukocytosis.  Patient's WBC is trended up to 22.9 and has now further trended up to 29.9 and he has endorgan damage with an abnormal LFTs and abnormal renal function now ?-Urinalysis was suggestive of UTI as it showed a cloudy appearance with large hemoglobin, large leukocytes, many bacteria, greater than 50 RBCs per high-power field, and 50 WBCs with urine culture showing less than 10,000 colony-forming units of insignificant growth ?-Patient has lactic acidosis and lactic acid trended from 3 and trended down to 2.2 but then bumped to 2.7 ?-He was started on IV vancomycin, Flagyl and cefepime.  His antibiotics were continued but but we de-escalated antibiotics to just IV ceftriaxone given that his E. coli is pansensitive ?-IV  hydration per sepsis protocol was provided.  -IV fluid hydration is not to stop ?-Urine culture pending but blood cultures resulted in the E. coli bacteremia ?-CT scan of the abdomen pelvis showed "No acute intra-abdominal pathology identified. No definite radiographic explanation for the patient's  reported symptoms. Trace bilateral pleural effusions. Mild coronary artery calcification. Cholelithiasis. Pancolonic diverticulosis without superimposed acute inflammatory change. Minimal urolithiasis with 7 mm dependently layering calculus with the bladder." ?-Of note, urine culture done on 11/07/2020 was positive for E. coli and this was sensitive to cefepime. ?-Continue Tylenol as needed for fever ?-Foley catheter was changed and he gets his catheter changed every month and is changed about a month ago ?-Continue to monitor and trend ? ?NSTEMI (non-ST elevated myocardial infarction) ([place]) ?This is possibly secondary to type II demand ischemia in the setting of severe sepsis and E. coli bacteremia ?-Cardiology is ordering echocardiogram to assess for any structural abnormalities and this was done and showed a normal systolic function as it showed "Left ventricular ejection fraction, by estimation, is 55 to 60%. The left ventricle has normal function. The left ventricle has no regional  wall motion abnormalities. There is moderate left ventricular hypertrophy. Left ventricular diastolic  parameters are indeterminate" ?-Troponin 110 > 655 > 2,362 > 2133 and trended down to [employer]; this has started to trend downwards ?-Cardiologist (Dr. Toney Rakes) on-call at [facility] was consulted and he reassured that this was possibly still due to type II demand ischemia considering patient's clinical picture of being septic. ?-Given ASA 325 mg x 1 was given is now been started on aspirin 81 mg p.o. daily and will continue ?-Cardiology has been consulted and recommending no beta-blocker due to intermittent bradycardia and hypotension and no statin given his abnormal LFTs.  They are planning for a cardiac catheterization at a later time during this admission once he is recovered from his sepsis and once his renal function stabilizes ?-He was on a heparin drip but this has been discontinued given his bleeding and given his normal systolic  function ? ?AKI (acute kidney injury) ([place]) ?Metabolic Acidosis ?-BUN/creatinine 27/1.65 (baseline creatinine at 0.8-1.2 and improved to 30/0.99 today ?-Patient's metabolic acidosis is improved and he has a CO2 of 24, anion gap of 9, chloride level 102 now ?-IV fluid hydration is stopped ?-Renal ultrasound done and showed no hydronephrosis; will order a bladder scan ?-Renally adjust medications, avoid nephrotoxic agents/dehydration/hypotension ?-Continue monitor and trend renal function carefully and repeat CMP in the a.m. ? ? ?Lactic acidosis ?-Lactic acid 75 > 3.0 -> 2.2 -> 2.7 -> 1.9 ?-IV fluid hydration has now stopped ?-Continue to monitor given that his lactic acid has resolved ? ?Hypophosphatemia ?-Phosphorous 2.3, this was replenished and is now 3.1 ?-Continue monitor and replete as necessary ?-Repeat phosphorus level in a.m. ? ?Hypomagnesemia ?-Mg level was 1.4 and is now improved to 2.1 ?-Mag level was replete yesterday ?-Continue to monitor and replete as necessary ?-Repeat magnesium level in the a.m. ? ? ?Thrombocytopenia ([place]) ?-Platelets 94, this has since decreased to 88 and then further worsened to 58 ?-He was on a heparin drip but this not been discontinued ?-Repeating a CBC this afternoon given that he had some bleeding from his urethral meatus ?-Continue to monitor for signs and symptoms bleeding; no overt bleeding noted and will need to repeat his CBC in a.m. ? ?Bleeding from urethra in male ?Bleeding- He has a indwelling Foley catheter and this was changed a few days ago. ?-Likely worsened in the setting of  his heparin drip and likely bleeding from his bladder and not his prostate ?-Heparin drip has now stopped ?-I spoke with urology who feels that the patient does not need to formally consult but recommended bacitracin to the urethral meatus and close monitoring ? ? ? ? ?Obesity (BMI 30-39.9) ?-Complicates overall prognosis and care ?-Estimated body mass index is 34.09 kg/m? as calculated from  the following: ?  Height as of this encounter: 6' (1.829 m). ?  Weight as of this encounter: 114 kg.  ?-Weight Loss and Dietary Counseling given ? ? ?Normocytic anemia ?- Patient's hemoglobin/hematocrit went fro

## 2021-04-20 NOTE — Care Management Important Message (Signed)
Important Message ? ?Patient Details  ?Name: Mario Massey ?MRN: [id number] ?Date of Birth: January 26, 1946 ? ? ?Medicare Important Message Given:  Yes ? ? ? ? ?Corey Doran ?04/20/2021, 4:47 PM ?

## 2021-04-20 NOTE — Progress Notes (Signed)
*  PRELIMINARY RESULTS* ?Echocardiogram ?2D Echocardiogram has been performed. ? ?Carolyne Fiscal ?04/20/2021, 9:09 AM ?

## 2021-04-20 NOTE — Progress Notes (Signed)
ANTICOAGULATION CONSULT NOTE - Follow Up Consult ? ?Pharmacy Consult for heparin gtt ?Indication: chest pain/ACS ? ?No Known Allergies ? ?Patient Measurements: ?Height: 6' (182.9 cm) ?Weight: 116 kg (255 lb 11.7 oz) ?IBW/kg (Calculated) : 77.6 ?Heparin Dosing Weight: 103 ? ?Vital Signs: ?Temp: 97.6 ?F (36.4 ?C) (03/24 0700) ?Temp Source: Oral (03/24 0700) ?BP: 102/51 (03/24 0600) ?Pulse Rate: 110 (03/24 0600) ? ?Labs: ?Recent Labs  ?  04/18/21 ?2029 04/18/21 ?2234 04/19/21 ?4010 04/19/21 ?2725 04/19/21 ?3664 04/19/21 ?1823 04/20/21 ?4034 04/20/21 ?7425  ?HGB 14.7  --  12.9*  --   --   --  11.7*  --   ?HCT 43.5  --  39.4  --   --   --  34.9*  --   ?PLT 94*  --  88*  --   --   --  58*  --   ?APTT 27  --   --   --   --   --   --   --   ?LABPROT 13.7  --   --   --   --   --   --   --   ?INR 1.1  --   --   --   --   --   --   --   ?HEPARINUNFRC  --   --   --   --   --  0.64 0.58 0.47  ?CREATININE 1.15  --  1.65*  --   --   --   --   --   ?TROPONINIHS 110*   < > 2,362* 2,133* 1,895*  --   --   --   ? < > = values in this interval not displayed.  ? ? ? ?Estimated Creatinine Clearance: 50.9 mL/min (A) (by C-G formula based on SCr of 1.65 mg/dL (H)). ? ? ?Medications:  ?Scheduled:  ? aspirin EC  81 mg Oral Daily  ? Chlorhexidine Gluconate Cloth  6 each Topical Daily  ? feeding supplement  237 mL Oral BID BM  ? venlafaxine XR  150 mg Oral Daily  ? ziprasidone  20 mg Oral Daily  ? ? ?Assessment: ?Mario Massey a 76 y.o. male requires anticoagulation with a heparin iv infusion for the indication of  chest pain/ACS. His heparin level is therapeutic at 0.47. ? ?Goal of Therapy:  ?Heparin level 0.3-0.7 units/ml ?Monitor platelets by anticoagulation protocol: Yes ?  ?Plan:  ?Continue heparin at 1200 units/hr ?Repeat anti-Xa level daily while on heparin ?Continue to monitor H&H and platelets ? ?[name] ?04/20/2021,7:53 AM ? ? ?

## 2021-04-21 LAB — CBC WITH DIFFERENTIAL/PLATELET
Basophils Absolute: 0.3 10*3/uL — ABNORMAL HIGH (ref 0.0–0.1)
Basophils Relative: 1 %
Eosinophils Absolute: 1.3 10*3/uL — ABNORMAL HIGH (ref 0.0–0.5)
Eosinophils Relative: 5 %
HCT: 34.2 % — ABNORMAL LOW (ref 39.0–52.0)
Hemoglobin: 11.5 g/dL — ABNORMAL LOW (ref 13.0–17.0)
Lymphocytes Relative: 3 %
Lymphs Abs: 0.8 10*3/uL (ref 0.7–4.0)
MCH: 33 pg (ref 26.0–34.0)
MCHC: 33.6 g/dL (ref 30.0–36.0)
MCV: 98 fL (ref 80.0–100.0)
Monocytes Absolute: 0.5 10*3/uL (ref 0.1–1.0)
Monocytes Relative: 2 %
Neutro Abs: 22.5 10*3/uL — ABNORMAL HIGH (ref 1.7–7.7)
Neutrophils Relative %: 89 %
Platelets: 57 10*3/uL — ABNORMAL LOW (ref 150–400)
RBC: 3.49 MIL/uL — ABNORMAL LOW (ref 4.22–5.81)
RDW: 13.4 % (ref 11.5–15.5)
WBC: 25.3 10*3/uL — ABNORMAL HIGH (ref 4.0–10.5)
nRBC: 0 % (ref 0.0–0.2)

## 2021-04-21 LAB — COMPREHENSIVE METABOLIC PANEL
ALT: 50 U/L — ABNORMAL HIGH (ref 0–44)
AST: 87 U/L — ABNORMAL HIGH (ref 15–41)
Albumin: 2.6 g/dL — ABNORMAL LOW (ref 3.5–5.0)
Alkaline Phosphatase: 72 U/L (ref 38–126)
Anion gap: 6 (ref 5–15)
BUN: 25 mg/dL — ABNORMAL HIGH (ref 8–23)
CO2: 26 mmol/L (ref 22–32)
Calcium: 7.9 mg/dL — ABNORMAL LOW (ref 8.9–10.3)
Chloride: 103 mmol/L (ref 98–111)
Creatinine, Ser: 0.85 mg/dL (ref 0.61–1.24)
GFR, Estimated: >60 mL/min (ref >60)
Glucose, Bld: 73 mg/dL (ref 70–99)
Potassium: 4.2 mmol/L (ref 3.5–5.1)
Sodium: 135 mmol/L (ref 135–145)
Total Bilirubin: 0.8 mg/dL (ref 0.3–1.2)
Total Protein: 5.6 g/dL — ABNORMAL LOW (ref 6.5–8.1)

## 2021-04-21 LAB — PHOSPHORUS: Phosphorus: 1.6 mg/dL — ABNORMAL LOW (ref 2.5–4.6)

## 2021-04-21 LAB — CULTURE, BLOOD (ROUTINE X 2)
Special Requests: ADEQUATE
Special Requests: ADEQUATE

## 2021-04-21 LAB — HEPARIN LEVEL (UNFRACTIONATED): Heparin Unfractionated: <0.1 IU/mL — ABNORMAL LOW (ref 0.30–0.70)

## 2021-04-21 LAB — MAGNESIUM: Magnesium: 2.4 mg/dL (ref 1.7–2.4)

## 2021-04-21 MED ORDER — K PHOS MONO-SOD PHOS DI & MONO 155-852-130 MG PO TABS
500.0000 mg | ORAL_TABLET | Freq: Two times a day (BID) | ORAL | Status: AC
  Administered 2021-04-21 (×2): 500 mg via ORAL
  Filled 2021-04-21 (×2): qty 2

## 2021-04-21 NOTE — Evaluation (Signed)
Physical Therapy Evaluation ?Patient Details ?Name: Mario Massey ?MRN: [id number] ?DOB: [birth year] ?Today's Date: 04/21/2021 ? ?History of Present Illness ? Mario Massey is a 76 y.o. male with medical history significant of hypertension, CAD, depression and bipolar disorder who presents to the emergency department via EMS due to 2-day history of vomiting and blood in Foley bag and was febrile on arrival to the ED with a temperature of 103F.  Patient was unable to provide more detailed history, patient appears to have a reported dementia per ED medical record. ?  ?Clinical Impression ? Patient presents seated in chair (assisted by nursing staff) and agreeable for therapy.  Patient unable to stand without AD due to weakness, required use of RW and Mod assist, very unsteady on feet and limited to a few side steps at bedside due to generalized weakness, poor standing balance and severe fall risk.  Patient put back to bed after therapy due to c/o fatigue after having sat up in chair for a couple of hours.  Patient will benefit from continued skilled physical therapy in hospital and recommended venue below to increase strength, balance, endurance for safe ADLs and gait.    ?   ? ?Recommendations for follow up therapy are one component of a multi-disciplinary discharge planning process, led by the attending physician.  Recommendations may be updated based on patient status, additional functional criteria and insurance authorization. ? ?Follow Up Recommendations Skilled nursing-short term rehab (<3 hours/day) ? ?  ?Assistance Recommended at Discharge Intermittent Supervision/Assistance  ?Patient can return home with the following ? A lot of help with bathing/dressing/bathroom;A lot of help with walking and/or transfers;Help with stairs or ramp for entrance;Assistance with cooking/housework ? ?  ?Equipment Recommendations Rolling walker (2 wheels)  ?Recommendations for Other Services ?    ?  ?Functional Status Assessment  Patient has had a recent decline in their functional status and demonstrates the ability to make significant improvements in function in a reasonable and predictable amount of time.  ? ?  ?Precautions / Restrictions Precautions ?Precautions: Fall ?Restrictions ?Weight Bearing Restrictions: No  ? ?  ? ?Mobility ? Bed Mobility ?Overal bed mobility: Needs Assistance ?Bed Mobility: Supine to Sit, Sit to Supine ?  ?  ?Supine to sit: Mod assist ?Sit to supine: Mod assist ?  ?General bed mobility comments: slow labored movement ?  ? ?Transfers ?Overall transfer level: Needs assistance ?Equipment used: Rolling walker (2 wheels) ?Transfers: Sit to/from Stand, Bed to chair/wheelchair/BSC ?Sit to Stand: Mod assist ?  ?Step pivot transfers: Mod assist ?  ?  ?  ?General transfer comment: unable to stand without AD due to weakness, required use of RW with Mod assist ?  ? ?Ambulation/Gait ?  ?Gait Distance (Feet): 5 Feet ?Assistive device: Rolling walker (2 wheels) ?Gait Pattern/deviations: Decreased step length - right, Decreased step length - left, Decreased stride length ?Gait velocity: decreased ?  ?  ?General Gait Details: limited to a few slow labored side steps due to poor standing balance and generalized weakness ? ?Stairs ?  ?  ?  ?  ?  ? ?Wheelchair Mobility ?  ? ?Modified Rankin (Stroke Patients Only) ?  ? ?  ? ?Balance Overall balance assessment: Needs assistance ?Sitting-balance support: Feet supported, No upper extremity supported ?Sitting balance-Leahy Scale: Fair ?Sitting balance - Comments: seated at EOB ?  ?Standing balance support: During functional activity, No upper extremity supported ?Standing balance-Leahy Scale: Poor ?Standing balance comment: fair/poor using RW ?  ?  ?  ?  ?  ?  ?  ?  ?  ?  ?  ?   ? ? ? ?  Pertinent Vitals/Pain Pain Assessment ?Pain Assessment: No/denies pain  ? ? ?Home Living Family/patient expects to be discharged to:: Private residence ?Living Arrangements: Alone ?Available Help at  Discharge: Family;Available PRN/intermittently ?Type of Home: Apartment ?Home Access: Level entry ?  ?Entrance Stairs-Number of Steps: stay on ground level ?  ?Home Layout: One level ?Home Equipment: Grab bars - tub/shower ?   ?  ?Prior Function Prior Level of Function : Independent/Modified Independent ?  ?  ?  ?  ?  ?  ?Mobility Comments: [employer], drives, shops ?ADLs Comments: Independent ?  ? ? ?Hand Dominance  ?   ? ?  ?Extremity/Trunk Assessment  ? Upper Extremity Assessment ?Upper Extremity Assessment: Generalized weakness ?  ? ?Lower Extremity Assessment ?Lower Extremity Assessment: Generalized weakness ?  ? ?Cervical / Trunk Assessment ?Cervical / Trunk Assessment: Normal  ?Communication  ? Communication: No difficulties  ?Cognition Arousal/Alertness: Awake/alert ?Behavior During Therapy: [facility] for tasks assessed/performed ?Overall Cognitive Status: Within Functional Limits for tasks assessed ?  ?  ?  ?  ?  ?  ?  ?  ?  ?  ?  ?  ?  ?  ?  ?  ?General Comments: requires occasional repeated verbal/tactile cueing due to mild confusion ?  ?  ? ?  ?General Comments   ? ?  ?Exercises    ? ?Assessment/Plan  ?  ?PT Assessment Patient needs continued PT services  ?PT Problem List Decreased strength;Decreased activity tolerance;Decreased balance;Decreased mobility ? ?   ?  ?PT Treatment Interventions DME instruction;Gait training;Stair training;Functional mobility training;Therapeutic activities;Therapeutic exercise;Patient/family education;Balance training   ? ?PT Goals (Current goals can be found in the Care Plan section)  ?Acute Rehab PT Goals ?Patient Stated Goal: return home after rehab ?PT Goal Formulation: With patient/family ?Time For Goal Achievement: 05/05/21 ?Potential to Achieve Goals: Good ? ?  ?Frequency Min 3X/week ?  ? ? ?Co-evaluation   ?  ?  ?  ?  ? ? ?  ?AM-PAC PT "6 Clicks" Mobility  ?Outcome Measure Help needed turning from your back to your side while in a flat bed without using  bedrails?: A Lot ?Help needed moving from lying on your back to sitting on the side of a flat bed without using bedrails?: A Lot ?Help needed moving to and from a bed to a chair (including a wheelchair)?: A Lot ?Help needed standing up from a chair using your arms (e.g., wheelchair or bedside chair)?: A Lot ?Help needed to walk in hospital room?: A Lot ?Help needed climbing 3-5 steps with a railing? : Total ?6 Click Score: 11 ? ?  ?End of Session   ?Activity Tolerance: Patient tolerated treatment well;Patient limited by fatigue ?Patient left: in bed;with call bell/phone within reach;with family/visitor present ?Nurse Communication: Mobility status ?PT Visit Diagnosis: Unsteadiness on feet (R26.81);Other abnormalities of gait and mobility (R26.89);Muscle weakness (generalized) (M62.81) ?  ? ?Time: [id number] ?PT Time Calculation (min) (ACUTE ONLY): 20 min ? ? ?Charges:   PT Evaluation ?$PT Eval Moderate Complexity: 1 Mod ?PT Treatments ?$Therapeutic Activity: 8-22 mins ?  ?   ? ? ?12:53 PM, 04/21/21 ?Lonell Grandchild, MPT ?Physical Therapist with [place] ?[facility] ?[phone] office ?[phone] mobile phone ? ? ?

## 2021-04-21 NOTE — Progress Notes (Signed)
?PROGRESS NOTE ? ? ? ?Mario Massey  NOB:[id number] DOB: Dec 24, 1945 DOA: 04/18/2021 ?PCP: [facility]  ? ?Brief Narrative:  ?HPI per Dr. Bernadette Hoit on 04/18/21 ?Mario Massey is a 76 y.o. male with medical history significant of hypertension, CAD, depression and bipolar disorder who presents to the emergency department via EMS due to 2-day history of vomiting and blood in Foley bag and was febrile on arrival to the ED with a temperature of 1043F.  Patient was unable to provide more detailed history, patient appears to have a reported dementia per ED medical record. ? ?ED course: ?In the emergency department, [name], tachycardic, febrile with a temperature of 103.43F, BP was 110/64, O2 sat was 96- 98%.  Work-up in the ED showed normal CBC except for WBC 1.5, platelets 94.  BMP was normal except for bicarb of 21 and BUN/creatinine 27/1.65 (baseline creatinine at 0.8-1.2), magnesium 1.4, phosphorous 2.3.  Lactic acid 2.9 > 3.0, troponin 110 > 655 > 2,362.  Influenza A, B, SARS coronavirus 2 was negative. ?Chest x-ray showed pulmonary hypoinflation ?Patient was treated with IV vancomycin, Flagyl and cefepime, IV hydration was provided.  Hospitalist was asked to admit patient for further evaluation and management. ? ?**Interim History ?Cardiology was consulted for further evaluation recommendations and they feel the patient had a NSTEMI related to his sepsis demand ischemia.  They are recommending a heparin drip.  He was also noted to be in a flutter with a new right bundle branch block but does subsequently had signs of Wenke block.  Cardiology recommends continuing antibiotics and avoiding AV nodal blocking agent due to his underlying condition.  Given that he continued of some abdominal discomfort and nausea vomiting we have ordered a CT scan of the abdomen pelvis without contrast given his renal function being worsened.  Patient was found to be septic secondary to UTI from caudae as well as as an E.  coli bacteremia.  His Foley catheter was changed.  Patient had abnormal LFTs and abnormal AKI in the setting of his sepsis. ? ?His echocardiogram was done and showed a normal systolic function.  His heparin drip was then discontinued given that he also had some bleeding from his urethral meatus where his Foley catheter was inserted and worsening thrombocytopenia.  Cardiology recommending planning for an ischemic evaluation once he recovers from sepsis and renal function stabilized which can be done in outpatient setting.  Cardiology recommends continuing aspirin 81 mg daily.  IV fluid hydration has now stopped. ? ?04/21/21: PT OT evaluated and they are recommending SNF.  Patient's WBC is trending down finally and is now 25.3.  Hemoglobin/hematocrit is stable and his hepatic function is also improving.  The bleeding from his urethral meatus is improved significantly.  ? ? ?Assessment and Plan: ?* Sepsis secondary to UTI [facility]) ?-SEVERE SEPSIS in the setting of E. coli bacteremia and subsequent CAUTI poA ?-Patient met sepsis criteria due to being febrile, tachypneic and tachycardic.  WBC was also 1.5 (less than 4) and this has since resulted in leukocytosis.  Patient's WBC is trended up to 22.9 and has now further trended up to 29.9 and he has endorgan damage with an abnormal LFTs and abnormal renal function now; WBC is improving as it is 25.3 ?-Urinalysis was suggestive of UTI as it showed a cloudy appearance with large hemoglobin, large leukocytes, many bacteria, greater than 50 RBCs per high-power field, and 50 WBCs with urine culture showing less than 10,000 colony-forming units of insignificant growth ?-Patient has lactic  acidosis and lactic acid trended from 3 and trended down to 2.2 but then bumped to 2.7 and now normalized at 1.9 ?-He was started on IV vancomycin, Flagyl and cefepime.  His antibiotics were continued but but we de-escalated antibiotics to just IV ceftriaxone given that his E. coli is  pansensitive ?-IV hydration per sepsis protocol was provided.  -IV fluid hydration is not to stop ?-Urine culture pending but blood cultures resulted in the E. coli bacteremia ?-CT scan of the abdomen pelvis showed "No acute intra-abdominal pathology identified. No definite radiographic explanation for the patient's reported symptoms. Trace bilateral pleural effusions. Mild coronary artery calcification. Cholelithiasis. Pancolonic diverticulosis without superimposed acute inflammatory change. Minimal urolithiasis with 7 mm dependently layering calculus with the bladder." ?-Of note, urine culture done on 11/07/2020 was positive for E. coli and this was sensitive to cefepime. ?-Continue Tylenol as needed for fever ?-Foley catheter was changed and he gets his catheter changed every month and is changed about a month ago ?-Continue to monitor and trend and sepsis physiology is improving significantly ?-CBC in the a.m. ?-PT OT evaluated given his generalized weakness and recommending SNF ? ?NSTEMI (non-ST elevated myocardial infarction) ([place]) ?This is possibly secondary to type II demand ischemia in the setting of severe sepsis and E. coli bacteremia ?-Cardiology is ordering echocardiogram to assess for any structural abnormalities and this was done and showed a normal systolic function as it showed "Left ventricular ejection fraction, by estimation, is 55 to 60%. The left ventricle has normal function. The left ventricle has no regional  wall motion abnormalities. There is moderate left ventricular hypertrophy. Left ventricular diastolic  parameters are indeterminate" ?-Troponin 110 > 655 > 2,362 > 2133 and trended down to [place]; this has started to trend downwards ?-Cardiologist (Dr. Toney Rakes) on-call at [facility] was consulted and he reassured that this was possibly still due to type II demand ischemia considering patient's clinical picture of being septic. ?-Given ASA 325 mg x 1 was given is now been started on aspirin 81  mg p.o. daily and will continue ?-Cardiology has been consulted and recommending no beta-blocker due to intermittent bradycardia and hypotension and no statin given his abnormal LFTs.  They are planning for a cardiac catheterization at a later time during this admission once he is recovered from his sepsis and once his renal function stabilizes ?-He was on a heparin drip but this has been discontinued given his bleeding and given his normal systolic function ? ?AKI (acute kidney injury) ([place]) ?Metabolic Acidosis ?-BUN/creatinine 27/1.65 (baseline creatinine at 0.8-1.2 and improved to 30/0.99 yesterday and today is 25/2.5 ?-Patient's metabolic acidosis is improved and he has a CO2 of 26, anion gap of 6, chloride level 103  now ?-IV fluid hydration is stopped ?-Renal ultrasound done and showed no hydronephrosis; will order a bladder scan ?-Renally adjust medications, avoid nephrotoxic agents/dehydration/hypotension ?-Continue monitor and trend renal function carefully and repeat CMP in the a.m. ? ? ?Lactic acidosis ?-Lactic acid 2.9 > 3.0 -> 2.2 -> 2.7 -> 1.9 ?-IV fluid hydration has now stopped ?-Continue to monitor given that his lactic acid has resolved ? ?Hypophosphatemia ?-Phosphorous is now 1.6 ?-Replete with p.o. K-Phos Neutral 500 mg p.o. twice daily x2 doses ?-Continue monitor and replete as necessary ?-Repeat phosphorus level in a.m. ? ?Hypomagnesemia ?-Mg level is now improved to 2.4 ?-Continue to monitor and replete as necessary ?-Repeat magnesium level in the a.m. ? ? ?Thrombocytopenia ([place]) ?-Platelets 94, this has since decreased to 88 and then further  worsened to 58 and dropped to 6 yesterday but then upon repeat this morning is 57 ?-He was on a heparin drip but this not been discontinued given his bleeding ?-Repeating a CBC this afternoon given that he had some bleeding from his urethral meatus ?-Continue to monitor for signs and symptoms bleeding; no overt bleeding noted and will need to repeat his  CBC in a.m. ? ?Bleeding from urethra in male ?-He has a indwelling Foley catheter and this was changed a few days ago. ?-Likely worsened in the setting of his heparin drip and likely bleeding from his bladder and not

## 2021-04-21 NOTE — Plan of Care (Signed)
?  Problem: Acute Rehab PT Goals(only PT should resolve) ?Goal: Pt Will Go Supine/Side To Sit ?Outcome: Progressing ?Flowsheets (Taken 04/21/2021 1254) ?Pt will go Supine/Side to Sit: with minimal assist ?Goal: Patient Will Transfer Sit To/From Stand ?Outcome: Progressing ?Flowsheets (Taken 04/21/2021 1254) ?Patient will transfer sit to/from stand: with minimal assist ?Goal: Pt Will Transfer Bed To Chair/Chair To Bed ?Outcome: Progressing ?Flowsheets (Taken 04/21/2021 1254) ?Pt will Transfer Bed to Chair/Chair to Bed: ? with mod assist ? with min assist ?Goal: Pt Will Ambulate ?Outcome: Progressing ?Flowsheets (Taken 04/21/2021 1254) ?Pt will Ambulate: ? 25 feet ? with minimal assist ? with moderate assist ? with rolling walker ?  ?12:55 PM, 04/21/21 ?Ocie Bob, MPT ?Physical Therapist with [place] ?[facility] ?[phone] office ?[phone] mobile phone ? ?

## 2021-04-22 LAB — COMPREHENSIVE METABOLIC PANEL
ALT: 69 U/L — ABNORMAL HIGH (ref 0–44)
AST: 93 U/L — ABNORMAL HIGH (ref 15–41)
Albumin: 2.5 g/dL — ABNORMAL LOW (ref 3.5–5.0)
Alkaline Phosphatase: 118 U/L (ref 38–126)
Anion gap: 3 — ABNORMAL LOW (ref 5–15)
BUN: 21 mg/dL (ref 8–23)
CO2: 27 mmol/L (ref 22–32)
Calcium: 7.9 mg/dL — ABNORMAL LOW (ref 8.9–10.3)
Chloride: 107 mmol/L (ref 98–111)
Creatinine, Ser: 0.74 mg/dL (ref 0.61–1.24)
GFR, Estimated: >60 mL/min (ref >60)
Glucose, Bld: 96 mg/dL (ref 70–99)
Potassium: 4.3 mmol/L (ref 3.5–5.1)
Sodium: 137 mmol/L (ref 135–145)
Total Bilirubin: 0.8 mg/dL (ref 0.3–1.2)
Total Protein: 5.4 g/dL — ABNORMAL LOW (ref 6.5–8.1)

## 2021-04-22 LAB — CBC WITH DIFFERENTIAL/PLATELET
Abs Immature Granulocytes: 0.08 10*3/uL — ABNORMAL HIGH (ref 0.00–0.07)
Basophils Absolute: 0.1 10*3/uL (ref 0.0–0.1)
Basophils Relative: 0 %
Eosinophils Absolute: 0.3 10*3/uL (ref 0.0–0.5)
Eosinophils Relative: 2 %
HCT: 33.3 % — ABNORMAL LOW (ref 39.0–52.0)
Hemoglobin: 11.2 g/dL — ABNORMAL LOW (ref 13.0–17.0)
Immature Granulocytes: 0 %
Lymphocytes Relative: 7 %
Lymphs Abs: 1.3 10*3/uL (ref 0.7–4.0)
MCH: 32.6 pg (ref 26.0–34.0)
MCHC: 33.6 g/dL (ref 30.0–36.0)
MCV: 96.8 fL (ref 80.0–100.0)
Monocytes Absolute: 0.8 10*3/uL (ref 0.1–1.0)
Monocytes Relative: 4 %
Neutro Abs: 16.1 10*3/uL — ABNORMAL HIGH (ref 1.7–7.7)
Neutrophils Relative %: 87 %
Platelets: 67 10*3/uL — ABNORMAL LOW (ref 150–400)
RBC: 3.44 MIL/uL — ABNORMAL LOW (ref 4.22–5.81)
RDW: 13.4 % (ref 11.5–15.5)
WBC: 18.7 10*3/uL — ABNORMAL HIGH (ref 4.0–10.5)
nRBC: 0 % (ref 0.0–0.2)

## 2021-04-22 LAB — PHOSPHORUS: Phosphorus: 2.4 mg/dL — ABNORMAL LOW (ref 2.5–4.6)

## 2021-04-22 LAB — MAGNESIUM: Magnesium: 2.1 mg/dL (ref 1.7–2.4)

## 2021-04-22 MED ORDER — K PHOS MONO-SOD PHOS DI & MONO 155-852-130 MG PO TABS
500.0000 mg | ORAL_TABLET | Freq: Once | ORAL | Status: AC
  Administered 2021-04-22: 500 mg via ORAL
  Filled 2021-04-22: qty 2

## 2021-04-22 MED ORDER — INFLUENZA VAC A&B SA ADJ QUAD 0.5 ML IM PRSY: 0.5000 mL | PREFILLED_SYRINGE | Freq: Once | INTRAMUSCULAR | Status: DC

## 2021-04-22 NOTE — Progress Notes (Signed)
?PROGRESS NOTE ? ? ? ?Mario Massey  NGE:[id number] DOB: 08-20-1945 DOA: 04/18/2021 ?PCP: [facility]  ? ?Brief Narrative:  ?HPI per Dr. Bernadette Hoit on 04/18/21 ?Mario Massey is a 76 y.o. male with medical history significant of hypertension, CAD, depression and bipolar disorder who presents to the emergency department via EMS due to 2-day history of vomiting and blood in Foley bag and was febrile on arrival to the ED with a temperature of 1027F.  Patient was unable to provide more detailed history, patient appears to have a reported dementia per ED medical record. ? ?ED course: ?In the emergency department, [name], tachycardic, febrile with a temperature of 103.27F, BP was 110/64, O2 sat was 96- 98%.  Work-up in the ED showed normal CBC except for WBC 1.5, platelets 94.  BMP was normal except for bicarb of 21 and BUN/creatinine 27/1.65 (baseline creatinine at 0.8-1.2), magnesium 1.4, phosphorous 2.3.  Lactic acid 2.9 > 3.0, troponin 110 > 655 > 2,362.  Influenza A, B, SARS coronavirus 2 was negative. ?Chest x-ray showed pulmonary hypoinflation ?Patient was treated with IV vancomycin, Flagyl and cefepime, IV hydration was provided.  Hospitalist was asked to admit patient for further evaluation and management. ? ?**Interim History ?Cardiology was consulted for further evaluation recommendations and they feel the patient had a NSTEMI related to his sepsis demand ischemia.  They are recommending a heparin drip.  He was also noted to be in a flutter with a new right bundle branch block but does subsequently had signs of Wenke block.  Cardiology recommends continuing antibiotics and avoiding AV nodal blocking agent due to his underlying condition.  Given that he continued of some abdominal discomfort and nausea vomiting we have ordered a CT scan of the abdomen pelvis without contrast given his renal function being worsened.  Patient was found to be septic secondary to UTI from caudae as well as as an E.  coli bacteremia.  His Foley catheter was changed.  Patient had abnormal LFTs and abnormal AKI in the setting of his sepsis. ? ?His echocardiogram was done and showed a normal systolic function.  His heparin drip was then discontinued given that he also had some bleeding from his urethral meatus where his Foley catheter was inserted and worsening thrombocytopenia.  Cardiology recommending planning for an ischemic evaluation once he recovers from sepsis and renal function stabilized which can be done in outpatient setting.  Cardiology recommends continuing aspirin 81 mg daily.  IV fluid hydration has now stopped. ? ?04/21/21: PT OT evaluated and they are recommending SNF.  Patient's WBC is trending down finally and is now 25.3.  Hemoglobin/hematocrit is stable and his hepatic function is also improving.  The bleeding from his urethral meatus is improved significantly. ? ?04/22/21: WBC is trending down and is now 18.7.  LFTs were trending down-but slightly bumped today so we will need to continue monitor him carefully.  Phos level is low at 2.4 so this was repleted.  PT OT evaluated and recommending SNF and patient is agreeable.  We will continue antibiotics for now  ? ? ?Assessment and Plan: ?* Sepsis secondary to UTI [facility]) ?-SEVERE SEPSIS in the setting of E. coli bacteremia and subsequent CAUTI poA ?-Patient met sepsis criteria due to being febrile, tachypneic and tachycardic.  WBC was also 1.5 (less than 4) and this has since resulted in leukocytosis.  Patient's WBC is trended up to 22.9 and has now further trended up to 29.9 and he has endorgan damage with an abnormal  LFTs and abnormal renal function now; WBC is improving as it is 18.7 ?-Urinalysis was suggestive of UTI as it showed a cloudy appearance with large hemoglobin, large leukocytes, many bacteria, greater than 50 RBCs per high-power field, and 50 WBCs with urine culture showing less than 10,000 colony-forming units of insignificant growth ?-Patient has  lactic acidosis and lactic acid trended from 3 and trended down to 2.2 but then bumped to 2.7 and now normalized at 1.9 ?-He was started on IV vancomycin, Flagyl and cefepime.  His antibiotics were continued but but we de-escalated antibiotics to just IV ceftriaxone given that his E. coli is pansensitive ?-IV hydration per sepsis protocol was provided.  -IV fluid hydration is not to stop ?-Urine culture pending but blood cultures resulted in the E. coli bacteremia ?-CT scan of the abdomen pelvis showed "No acute intra-abdominal pathology identified. No definite radiographic explanation for the patient's reported symptoms. Trace bilateral pleural effusions. Mild coronary artery calcification. Cholelithiasis. Pancolonic diverticulosis without superimposed acute inflammatory change. Minimal urolithiasis with 7 mm dependently layering calculus with the bladder." ?-Of note, urine culture done on 11/07/2020 was positive for E. coli and this was sensitive to cefepime. ?-Continue Tylenol as needed for fever ?-Foley catheter was changed and he gets his catheter changed every month and is changed about a month ago ?-Continue to monitor and trend and sepsis physiology is improving significantly and resolving ?-CBC in the a.m. ?-PT OT evaluated given his generalized weakness and recommending SNF and he is agreeable to go ? ?NSTEMI (non-ST elevated myocardial infarction) ([place]) ?This is possibly secondary to type II demand ischemia in the setting of severe sepsis and E. coli bacteremia ?-Cardiology is ordering echocardiogram to assess for any structural abnormalities and this was done and showed a normal systolic function as it showed "Left ventricular ejection fraction, by estimation, is 55 to 60%. The left ventricle has normal function. The left ventricle has no regional  wall motion abnormalities. There is moderate left ventricular hypertrophy. Left ventricular diastolic  parameters are indeterminate" ?-Troponin 110 > 655 >  2,362 > 2133 and trended down to [place]; this has started to trend downwards ?-Cardiologist (Dr. Toney Rakes) on-call at [facility] was consulted and he reassured that this was possibly still due to type II demand ischemia considering patient's clinical picture of being septic. ?-Given ASA 325 mg x 1 was given is now been started on aspirin 81 mg p.o. daily and will continue ?-Cardiology has been consulted and recommending no beta-blocker due to intermittent bradycardia and hypotension and no statin given his abnormal LFTs.  They are planning for a cardiac catheterization at a later time during this admission once he is recovered from his sepsis and once his renal function stabilizes ?-He was on a heparin drip but this has been discontinued given his bleeding and given his normal systolic function ? ?AKI (acute kidney injury) ([place]) ?Metabolic Acidosis ?-BUN/creatinine 27/1.65 (baseline creatinine at 0.8-1.2 and improved to 21/0.74 now ?-Patient's metabolic acidosis is improved and he has a CO2 of 27, anion gap of 3, chloride level 107  now ?-IV fluid hydration is stopped ?-Renal ultrasound done and showed no hydronephrosis; will order a bladder scan ?-Renally adjust medications, avoid nephrotoxic agents/dehydration/hypotension ?-Continue monitor and trend renal function carefully and repeat CMP in the a.m. ? ? ?Lactic acidosis ?-Lactic acid 2.9 > 3.0 -> 2.2 -> 2.7 -> 1.9 ?-IV fluid hydration has now stopped ?-Continue to monitor given that his lactic acid has resolved ? ?Hypophosphatemia ?-Phosphorous is now 2.4 ?-  Replete with p.o. K-Phos Neutral 500 mg x1 ?-Continue monitor and replete as necessary ?-Repeat phosphorus level in a.m. ? ?Hypomagnesemia ?-Mg level is now improved to 2.4 yesterday and is now 2.1 ?-Continue to monitor and replete as necessary ?-Repeat magnesium level in the a.m. ? ? ?Thrombocytopenia ([place]) ?-Platelets 94 on admitted and dropped down to 45 but is now trending back up in a 67 ?-He was on a heparin  drip but this not been discontinued given his bleeding ?-Repeating a CBC this afternoon given that he had some bleeding from his urethral meatus ?-Continue to monitor for signs and symptoms bleeding; no overt bl

## 2021-04-22 NOTE — Progress Notes (Signed)
CSW completed PASSR screening and FL2.  ? ?CSW spoke with patient's daughter Lilyan Punt who states she has spoken with patient and that he is agreeable for placement. Lilyan Punt reports she does not have a preference for facility just as close to [facility] as possible. ? ?CSW will fax patient's clinicals out for review. ? ?Edwin Dada, MSW, LCSW ?Transitions of Care  Clinical Social Worker II ?[phone] ? ?

## 2021-04-22 NOTE — [place] FL2 (Signed)
?[place] MEDICAID FL2 LEVEL OF CARE SCREENING TOOL  ?  ? ?IDENTIFICATION  ?Patient Name: ?Mario Massey Birthdate: Jun 08, 1945 Sex: male Admission Date (Current Location): ?04/18/2021  ?[place] and [place] Number: ? [place] and Address:  ?[place] [address], [place] ?     Provider Number: ?[id number]  ?Attending Physician Name and Address:  ?Raiford Noble [place], DO ? Relative Name and Phone Number:  ?  ?   ?Current Level of Care: ?Hospital Recommended Level of Care: ?[place] Prior Approval Number: ?  ? ?Date Approved/Denied: ?04/22/21 PASRR Number: ?[id number] ? ?Discharge Plan: ?SNF ?  ? ?Current Diagnoses: ?Patient Active Problem List  ? Diagnosis Date Noted  ? Bleeding from urethra in male 04/20/2021  ? Lactic acidosis 04/19/2021  ? Leukopenia 04/19/2021  ? Thrombocytopenia ([place]) 04/19/2021  ? AKI (acute kidney injury) ([place]) 04/19/2021  ? Hypomagnesemia 04/19/2021  ? Hypophosphatemia 04/19/2021  ? Hypoalbuminemia due to protein-calorie malnutrition ([place]) 04/19/2021  ? Transaminitis 04/19/2021  ? NSTEMI (non-ST elevated myocardial infarction) ([place]) 04/19/2021  ? Essential hypertension 04/19/2021  ? CAD (coronary artery disease) 04/19/2021  ? Nausea & vomiting 04/19/2021  ? Wenckebach 04/19/2021  ? Hyperbilirubinemia 04/19/2021  ? Normocytic anemia 04/19/2021  ? Obesity (BMI 30-39.9) 04/19/2021  ? Sepsis secondary to UTI ([place]) 04/18/2021  ? Stiffness of knee joint 01/17/2012  ? Difficulty in walking(719.7) 01/17/2012  ? ? ?Orientation RESPIRATION BLADDER Height & Weight   ?  ?Self, Place ? Normal Continent, Indwelling catheter Weight: 255 lb 11.7 oz (116 kg) ?Height:  6' (182.9 cm)  ?BEHAVIORAL SYMPTOMS/MOOD NEUROLOGICAL BOWEL NUTRITION STATUS  ?    Continent Diet  ?AMBULATORY STATUS COMMUNICATION OF NEEDS Skin   ?Extensive Assist Verbally Normal ?  ?  ?  ?    ?     ?     ? ? ?Personal Care Assistance Level of Assistance  ?Bathing, Feeding,  Dressing Bathing Assistance: Maximum assistance ?Feeding assistance: Limited assistance ?Dressing Assistance: Maximum assistance ?   ? ?Functional Limitations Info  ?Speech, Sight, Hearing Sight Info: Adequate ?Hearing Info: Adequate ?Speech Info: Adequate  ? ? ?SPECIAL CARE FACTORS FREQUENCY  ?PT (By licensed PT), OT (By licensed OT)   ?  ?PT Frequency: 5x weekly ?OT Frequency: 5x weekly ?  ?  ?  ?   ? ? ?Contractures Contractures Info: Not present  ? ? ?Additional Factors Info  ?Code Status, Allergies Code Status Info: Full Code ?Allergies Info: No known allergies ?  ?  ?  ?   ? ?Current Medications (04/22/2021):  This is the current hospital active medication list ?Current Facility-Administered Medications  ?Medication Dose Route Frequency Provider Last Rate Last Admin  ? acetaminophen (TYLENOL) tablet 650 mg  650 mg Oral Q6H PRN Adefeso, Oladapo, DO      ? alum & mag hydroxide-simeth (MAALOX/MYLANTA) 200-200-20 MG/5ML suspension 30 mL  30 mL Oral Q4H PRN Adefeso, Oladapo, DO   30 mL at 04/19/21 1840  ? aspirin EC tablet 81 mg  81 mg Oral Daily Erma Heritage, PA-C   81 mg at 04/22/21 [id number]  ? bacitracin ointment   Topical BID Raiford Noble [place], [place]   Given at 04/22/21 [id number]  ? cefTRIAXone (ROCEPHIN) 2 g in sodium chloride 0.9 % 100 mL IVPB  2 g Intravenous Q24H Raiford Noble [place], DO 200 mL/hr at 04/22/21 0946 2 g at 04/22/21 0946  ? Chlorhexidine Gluconate Cloth 2 % PADS 6 each  6 each  Topical Daily Raiford Noble [place], [place]   6 each at 04/22/21 [id number]  ? feeding supplement (ENSURE ENLIVE / ENSURE PLUS) liquid 237 mL  237 mL Oral BID BM Adefeso, Oladapo, DO      ? ondansetron (ZOFRAN) injection 4 mg  4 mg Intravenous Q6H PRN Adefeso, Oladapo, DO   4 mg at 04/19/21 0836  ? prochlorperazine (COMPAZINE) injection 10 mg  10 mg Intravenous Q6H PRN Raiford Noble [place], DO      ? venlafaxine XR (EFFEXOR-XR) 24 hr capsule 150 mg  150 mg Oral Daily Raiford Noble [place], DO   150 mg at 04/22/21 0815  ? ziprasidone  (GEODON) capsule 20 mg  20 mg Oral Daily Raiford Noble [place], DO   20 mg at 04/22/21 [id number]  ? ? ? ?Discharge Medications: ?Please see discharge summary for a list of discharge medications. ? ?Relevant Imaging Results: ? ?Relevant Lab Results: ? ? ?Additional Information ?SSN: [id number] ? ?Archie Endo, LCSW ? ? ? ? ?

## 2021-04-23 DIAGNOSIS — I4892 Unspecified atrial flutter: Secondary | ICD-10-CM

## 2021-04-23 LAB — COMPREHENSIVE METABOLIC PANEL
ALT: 66 U/L — ABNORMAL HIGH (ref 0–44)
AST: 72 U/L — ABNORMAL HIGH (ref 15–41)
Albumin: 2.5 g/dL — ABNORMAL LOW (ref 3.5–5.0)
Alkaline Phosphatase: 114 U/L (ref 38–126)
Anion gap: 5 (ref 5–15)
BUN: 20 mg/dL (ref 8–23)
CO2: 25 mmol/L (ref 22–32)
Calcium: 8 mg/dL — ABNORMAL LOW (ref 8.9–10.3)
Chloride: 109 mmol/L (ref 98–111)
Creatinine, Ser: 0.63 mg/dL (ref 0.61–1.24)
GFR, Estimated: >60 mL/min (ref >60)
Glucose, Bld: 88 mg/dL (ref 70–99)
Potassium: 3.7 mmol/L (ref 3.5–5.1)
Sodium: 139 mmol/L (ref 135–145)
Total Bilirubin: 0.6 mg/dL (ref 0.3–1.2)
Total Protein: 5.2 g/dL — ABNORMAL LOW (ref 6.5–8.1)

## 2021-04-23 LAB — CBC WITH DIFFERENTIAL/PLATELET
Abs Immature Granulocytes: 0.09 10*3/uL — ABNORMAL HIGH (ref 0.00–0.07)
Basophils Absolute: 0.1 10*3/uL (ref 0.0–0.1)
Basophils Relative: 1 %
Eosinophils Absolute: 0.3 10*3/uL (ref 0.0–0.5)
Eosinophils Relative: 3 %
HCT: 34.8 % — ABNORMAL LOW (ref 39.0–52.0)
Hemoglobin: 11.9 g/dL — ABNORMAL LOW (ref 13.0–17.0)
Immature Granulocytes: 1 %
Lymphocytes Relative: 12 %
Lymphs Abs: 1.3 10*3/uL (ref 0.7–4.0)
MCH: 33 pg (ref 26.0–34.0)
MCHC: 34.2 g/dL (ref 30.0–36.0)
MCV: 96.4 fL (ref 80.0–100.0)
Monocytes Absolute: 1 10*3/uL (ref 0.1–1.0)
Monocytes Relative: 9 %
Neutro Abs: 7.9 10*3/uL — ABNORMAL HIGH (ref 1.7–7.7)
Neutrophils Relative %: 74 %
Platelets: 71 10*3/uL — ABNORMAL LOW (ref 150–400)
RBC: 3.61 MIL/uL — ABNORMAL LOW (ref 4.22–5.81)
RDW: 13.6 % (ref 11.5–15.5)
WBC: 10.5 10*3/uL (ref 4.0–10.5)
nRBC: 0 % (ref 0.0–0.2)

## 2021-04-23 LAB — IRON AND TIBC
Iron: 27 ug/dL — ABNORMAL LOW (ref 45–182)
Saturation Ratios: 12 % — ABNORMAL LOW (ref 17.9–39.5)
TIBC: 234 ug/dL — ABNORMAL LOW (ref 250–450)
UIBC: 207 ug/dL

## 2021-04-23 LAB — FERRITIN: Ferritin: 185 ng/mL (ref 24–336)

## 2021-04-23 LAB — PHOSPHORUS: Phosphorus: 3.2 mg/dL (ref 2.5–4.6)

## 2021-04-23 LAB — VITAMIN B12: Vitamin B-12: 2092 pg/mL — ABNORMAL HIGH (ref 180–914)

## 2021-04-23 LAB — RETICULOCYTES
Immature Retic Fract: 4.9 % (ref 2.3–15.9)
RBC.: 3.63 MIL/uL — ABNORMAL LOW (ref 4.22–5.81)
Retic Count, Absolute: 38.5 10*3/uL (ref 19.0–186.0)
Retic Ct Pct: 1.1 % (ref 0.4–3.1)

## 2021-04-23 LAB — MAGNESIUM: Magnesium: 1.9 mg/dL (ref 1.7–2.4)

## 2021-04-23 LAB — FOLATE: Folate: 10.6 ng/mL (ref >5.9)

## 2021-04-23 MED ORDER — POLYSACCHARIDE IRON COMPLEX 150 MG PO CAPS
150.0000 mg | ORAL_CAPSULE | Freq: Every day | ORAL | Status: DC
Start: 2021-04-23 — End: 2021-04-23
  Administered 2021-04-23: 150 mg via ORAL
  Filled 2021-04-23: qty 1

## 2021-04-23 MED ORDER — ENSURE ENLIVE PO LIQD: 237.0000 mL | Freq: Two times a day (BID) | ORAL | 12 refills | Status: DC

## 2021-04-23 MED ORDER — POLYSACCHARIDE IRON COMPLEX 150 MG PO CAPS
150.0000 mg | ORAL_CAPSULE | Freq: Every day | ORAL | 0 refills | Status: DC
Start: 2021-04-23 — End: 2021-05-11

## 2021-04-23 MED ORDER — CEFADROXIL 500 MG PO CAPS: 1000.0000 mg | ORAL_CAPSULE | Freq: Two times a day (BID) | ORAL | 0 refills | Status: AC

## 2021-04-23 MED ORDER — BACITRACIN ZINC 500 UNIT/GM EX OINT: TOPICAL_OINTMENT | Freq: Two times a day (BID) | CUTANEOUS | 0 refills | Status: DC

## 2021-04-23 MED ORDER — CEFADROXIL 500 MG PO CAPS
1000.0000 mg | ORAL_CAPSULE | Freq: Two times a day (BID) | ORAL | Status: DC
  Administered 2021-04-23: 1000 mg via ORAL
  Filled 2021-04-23 (×4): qty 2

## 2021-04-23 NOTE — Assessment & Plan Note (Signed)
-   Was seen on telemetry and he is not a candidate for anticoagulation given his ongoing thrombocytopenia and bleeding from his urethral meatus ?-Cardiology feels he does not need cardioversion and they feel that he will probably auto convert as his illness improves ?

## 2021-04-23 NOTE — TOC Transition Note (Signed)
Transition of Care (TOC) - CM/SW Discharge Note ? ? ?Patient Details  ?Name: Mario Massey ?MRN: [id number] ?Date of Birth: January 15, 1946 ? ?Transition of Care (TOC) CM/SW Contact:  ?Karn Cassis, LCSW ?Phone Number: ?04/23/2021, 1:02 PM ? ? ?Clinical Narrative: Pt d/c today to SNF. Pt's daughter accepts bed at [facility]. She reports she will discuss with pt, but he has had some confusion. RN updated. D/C summary sent to SNF. Pt will transfer with staff. RN given number for report.    ? ? ? ?Final next level of care: Skilled Nursing Facility ?Barriers to Discharge: Barriers Resolved ? ? ?Patient Goals and CMS Choice ?  ?  ?Choice offered to / list presented to : Adult Children ? ?Discharge Placement ?  ?           ?Patient chooses bed at: [facility] ?Patient to be transferred to facility by: staff ?Name of family member notified: daughter ?Patient and family notified of of transfer: 04/23/21 ? ?Discharge Plan and Services ?  ?  ?           ?  ?  ?  ?  ?  ?  ?  ?  ?  ?  ? ?Social Determinants of Health (SDOH) Interventions ?  ? ? ?Readmission Risk Interventions ?   ? View : No data to display.  ?  ?  ?  ? ? ? ? ? ?

## 2021-04-23 NOTE — Discharge Summary (Signed)
?Physician Discharge Summary ?  ?Patient: Mario Massey MRN: [id number] DOB: 10/27/1945  ?Admit date:     04/18/2021  ?Discharge date: 04/23/21  ?Discharge Physician:  , DO  ? ?PCP: [facility]  ? ?Recommendations at discharge:  ? ?Follow-up with PCP within 1 to 2 weeks and repeat CBC, CMP, mag, Phos in 1 week ?Follow-up with urology in the outpatient setting and have frequent catheter changes ?Follow-up with cardiology in outpatient setting for ischemic evaluation and further titration of medications as we are unable to use beta-blocker and AV nodal blocking agents/GDMT because of hypotension and bradycardia; continue aspirin 81 mg per cardiology recommendations ?Continue rehabilitative efforts at SNF ? ?Discharge Diagnoses: ?Principal Problem: ?  Sepsis secondary to UTI (HCC) ?Active Problems: ?  Lactic acidosis ?  AKI (acute kidney injury) (HCC) ?  NSTEMI (non-ST elevated myocardial infarction) (HCC) ?  Thrombocytopenia (HCC) ?  Hypomagnesemia ?  Hypophosphatemia ?  Leukopenia ?  Hypoalbuminemia due to protein-calorie malnutrition (HCC) ?  Transaminitis ?  Essential hypertension ?  CAD (coronary artery disease) ?  Nausea & vomiting ?  Wenckebach ?  Hyperbilirubinemia ?  Normocytic anemia ?  Obesity (BMI 30-39.9) ?  Bleeding from urethra in male ?  Atrial flutter (HCC) ? ?Resolved Problems: ?  * No resolved hospital problems. * ? ?Hospital Course: ?HPI per Dr. Oladapo Adefeso on 04/18/21 ?Mario Massey is a 76 y.o. male with medical history significant of hypertension, CAD, depression and bipolar disorder who presents to the emergency department via EMS due to 2-day history of vomiting and blood in Foley bag and was febrile on arrival to the ED with a temperature of 103F.  Patient was unable to provide more detailed history, patient appears to have a reported dementia per ED medical record. ? ?ED course: ?In the emergency department, Mario Massey, tachycardic, febrile with a temperature of 103.3F,  BP was 110/64, O2 sat was 96- 98%.  Work-up in the ED showed normal CBC except for WBC 1.5, platelets 94.  BMP was normal except for bicarb of 21 and BUN/creatinine 27/1.65 (baseline creatinine at 0.8-1.2), magnesium 1.4, phosphorous 2.3.  Lactic acid 2.9 > 3.0, troponin 110 > 655 > 2,362.  Influenza A, B, SARS coronavirus 2 was negative. ?Chest x-ray showed pulmonary hypoinflation ?Patient was treated with IV vancomycin, Flagyl and cefepime, IV hydration was provided.  Hospitalist was asked to admit patient for further evaluation and management. ? ?**Interim History ?Cardiology was consulted for further evaluation recommendations and they feel the patient had a NSTEMI related to his sepsis demand ischemia.  They are recommending a heparin drip.  He was also noted to be in a flutter with a new right bundle branch block but does subsequently had signs of Wenke block.  Cardiology recommends continuing antibiotics and avoiding AV nodal blocking agent due to his underlying condition.  Given that he continued of some abdominal discomfort and nausea vomiting we have ordered a CT scan of the abdomen pelvis without contrast given his renal function being worsened.  Patient was found to be septic secondary to UTI from caudae as well as as an E. coli bacteremia.  His Foley catheter was changed.  Patient had abnormal LFTs and abnormal AKI in the setting of his sepsis. ? ?His echocardiogram was done and showed a normal systolic function.  His heparin drip was then discontinued given that he also had some bleeding from his urethral meatus where his Foley catheter was inserted and worsening thrombocytopenia.  Cardiology recommending planning for an   ischemic evaluation once he recovers from sepsis and renal function stabilized which can be done in outpatient setting.  Cardiology recommends continuing aspirin 81 mg daily.  IV fluid hydration has now stopped. ? ?04/21/21: PT OT evaluated and they are recommending SNF.  Patient's WBC  is trending down finally and is now 25.3.  Hemoglobin/hematocrit is stable and his hepatic function is also improving.  The bleeding from his urethral meatus is improved significantly. ? ?04/22/21: WBC is trending down and is now 18.7.  LFTs were trending down-but slightly bumped today so we will need to continue monitor him carefully.  Phos level is low at 2.4 so this was repleted.  PT OT evaluated and recommending SNF and patient is agreeable.  We will continue antibiotics for now ? ?04/23/2021: Patient is much improved and his liver numbers are trending down further.  He does have a slight anemia but is stable and his anemia panel shows that his iron level is low we will start him on iron supplementation.  Overall he is remarkably improved and denies any complaints and he is medically stable to be discharged to skilled nursing facility today as cardiology has cleared him from their perspective and recommending outpatient ischemic evaluation.  Daughter was updated and she had no further questions and he will be transported to his SNF for continued rehabilitative efforts and will need outpatient urology follow-up as well as cardiology follow-up. ? ?Assessment and Plan: ?* Sepsis secondary to UTI (HCC) ?-SEVERE SEPSIS in the setting of E. coli bacteremia and subsequent CAUTI poA, improving  ?-Patient met sepsis criteria due to being febrile, tachypneic and tachycardic.  WBC was also 1.5 (less than 4) and this has since resulted in leukocytosis.  Patient's WBC is trended up to 22.9 and has now further trended up to 29.9 and he has endorgan damage with an abnormal LFTs and abnormal renal function now; WBC is improving and normalized at 10.5 ?-Urinalysis was suggestive of UTI as it showed a cloudy appearance with large hemoglobin, large leukocytes, many bacteria, greater than 50 RBCs per high-power field, and 50 WBCs with urine culture showing less than 10,000 colony-forming units of insignificant growth ?-Patient has  lactic acidosis and lactic acid trended from 3 and trended down to 2.2 but then bumped to 2.7 and now normalized at 1.9 ?-He was started on IV vancomycin, Flagyl and cefepime.  His antibiotics were continued but but we de-escalated antibiotics to just IV ceftriaxone given that his E. coli is pansensitive; Now that he is being discharged will change to po Cefadroxil 1000 mg po BID  ?-IV hydration per sepsis protocol was provided.  -IV fluid hydration is not to stop ?-Urine culture pending but blood cultures resulted in the E. coli bacteremia ?-CT scan of the abdomen pelvis showed "No acute intra-abdominal pathology identified. No definite radiographic explanation for the patient's reported symptoms. Trace bilateral pleural effusions. Mild coronary artery calcification. Cholelithiasis. Pancolonic diverticulosis without superimposed acute inflammatory change. Minimal urolithiasis with 7 mm dependently layering calculus with the bladder." ?-Of note, urine culture done on 11/07/2020 was positive for E. coli and this was sensitive to cefepime. ?-Continue Tylenol as needed for fever ?-Foley catheter was changed and he gets his catheter changed every month and is changed about a month ago ?-Continue to monitor and trend and sepsis physiology is improving significantly and resolving ?-CBC in the a.m. ?-PT OT evaluated given his generalized weakness and recommending SNF and he is agreeable to go ? ?NSTEMI (non-ST elevated myocardial infarction) (  [place]) ?This is possibly secondary to type II demand ischemia in the setting of severe sepsis and E. coli bacteremia ?-Cardiology is ordering echocardiogram to assess for any structural abnormalities and this was done and showed a normal systolic function as it showed "Left ventricular ejection fraction, by estimation, is 55 to 60%. The left ventricle has normal function. The left ventricle has no regional  wall motion abnormalities. There is moderate left ventricular hypertrophy. Left  ventricular diastolic  parameters are indeterminate" ?-Troponin 110 > 655 > 2,362 > 2133 and trended down to [place]; this has started to trend downwards ?-Cardiologist (Dr. Toney Rakes) on-call at [facility] was co

## 2021-04-23 NOTE — Plan of Care (Signed)
?  Problem: Acute Rehab OT Goals (only OT should resolve) ?Goal: Pt. Will Perform Grooming ?Flowsheets (Taken 04/23/2021 [phone]) ?Pt Will Perform Grooming: ? with modified independence ? sitting ?Goal: Pt. Will Perform Upper Body Bathing ?Flowsheets (Taken 04/23/2021 [phone]) ?Pt Will Perform Upper Body Bathing: ? with set-up ? sitting ?Goal: Pt. Will Perform Lower Body Bathing ?Flowsheets (Taken 04/23/2021 [phone]) ?Pt Will Perform Lower Body Bathing: ? with mod assist ? sit to/from stand ? sitting/lateral leans ?Goal: Pt. Will Perform Upper Body Dressing ?Flowsheets (Taken 04/23/2021 [phone]) ?Pt Will Perform Upper Body Dressing: ? with set-up ? sitting ?Goal: Pt. Will Perform Lower Body Dressing ?Flowsheets (Taken 04/23/2021 [phone]) ?Pt Will Perform Lower Body Dressing: ? with mod assist ? sit to/from stand ? sitting/lateral leans ?Goal: Pt. Will Transfer To Toilet ?Flowsheets (Taken 04/23/2021 [phone]) ?Pt Will Transfer to Toilet: ? with supervision ? ambulating ? bedside commode ?Goal: Pt. Will Perform Toileting-Clothing Manipulation ?Flowsheets (Taken 04/23/2021 [phone]) ?Pt Will Perform Toileting - Clothing Manipulation and hygiene: ? with supervision ? sitting/lateral leans ? sit to/from stand ?Goal: Pt/Caregiver Will Perform Home Exercise Program ?Flowsheets (Taken 04/23/2021 [phone]) ?Pt/caregiver will Perform Home Exercise Program: ? Increased strength ? Both right and left upper extremity ? With Supervision ? With written HEP provided ?  ?

## 2021-04-23 NOTE — Evaluation (Signed)
Occupational Therapy Evaluation ?Patient Details ?Name: Mario Massey ?MRN: [id number] ?DOB: 12/29/1945 ?Today's Date: 04/23/2021 ? ? ?History of Present Illness Rockwell Zentz is a 76 y.o. male with medical history significant of hypertension, CAD, depression and bipolar disorder who presents to the emergency department via EMS due to 2-day history of vomiting and blood in Foley bag and was febrile on arrival to the ED with a temperature of 103F.  Patient was unable to provide more detailed history, patient appears to have a reported dementia per ED medical record.  ? ?Clinical Impression ?  ?Pt in recliner finishing breakfast upon therapy arrival and agreeable to participate in skilled OT services. Patient presents with baseline bilateral shoulder issues with limited ROM. Patient demonstrates decreased BUE strength and activity tolerance requiring increased assistance to complete ADL tasks. Recommend discharge to SNF to focus on mentioned deficits prior to returning home safely. OT will follow patient acutely.   ?   ? ?Recommendations for follow up therapy are one component of a multi-disciplinary discharge planning process, led by the attending physician.  Recommendations may be updated based on patient status, additional functional criteria and insurance authorization.  ? ?Follow Up Recommendations ? Skilled nursing-short term rehab (<3 hours/day)  ?  ?Assistance Recommended at Discharge Intermittent Supervision/Assistance  ?Patient can return home with the following A lot of help with walking and/or transfers;A lot of help with bathing/dressing/bathroom;Assistance with cooking/housework;Assist for transportation ? ?  ?Functional Status Assessment ? Patient has had a recent decline in their functional status and demonstrates the ability to make significant improvements in function in a reasonable and predictable amount of time.  ?Equipment Recommendations ? None recommended by OT  ?  ?   ?Precautions /  Restrictions Precautions ?Precautions: Fall ?Restrictions ?Weight Bearing Restrictions: No  ? ?  ? ?   ?   ? ?ADL either performed or assessed with clinical judgement  ? ?ADL Overall ADL's : Needs assistance/impaired ?Eating/Feeding: Set up;Sitting ?  ?Grooming: Oral care;Wash/dry face;Wash/dry hands;Set up;Sitting ?  ?Upper Body Bathing: Sitting;Minimal assistance ?  ?Lower Body Bathing: Maximal assistance;Sit to/from stand;Sitting/lateral leans ?  ?Upper Body Dressing : Minimal assistance;Sitting ?  ?Lower Body Dressing: Maximal assistance;Sit to/from stand;Sitting/lateral leans ?  ?Toilet Transfer: Moderate assistance;BSC/3in1;Rolling walker (2 wheels);Stand-pivot ?  ?Toileting- Clothing Manipulation and Hygiene: Total assistance;Sit to/from stand;Sitting/lateral lean ?  ?  ?  ?  ?   ? ? ? ?Vision Baseline Vision/History: 1 Wears glasses ?Ability to See in Adequate Light: 0 Adequate ?Patient Visual Report: No change from baseline ?   ?   ?   ?   ? ?Pertinent Vitals/Pain Pain Assessment ?Pain Assessment: No/denies pain  ? ? ? ?Hand Dominance Right ?  ?Extremity/Trunk Assessment Upper Extremity Assessment ?Upper Extremity Assessment: Generalized weakness (Baseline chronic bilateral shoulder issues with crepitus noted during passive ROM. Unable to demonstrate A/ROM shoulder flexion passed 90 degrees. Unable to complete Active abduction bilaterally due to pain.) ?  ?Lower Extremity Assessment ?Lower Extremity Assessment: Defer to PT evaluation ?  ?  ?  ?Communication Communication ?Communication: No difficulties ?  ?Cognition Arousal/Alertness: Awake/alert ?Behavior During Therapy: [facility] for tasks assessed/performed ?Overall Cognitive Status: Within Functional Limits for tasks assessed ?  ?  ?  ?  ?   ?   ?   ? ? ?Home Living Family/patient expects to be discharged to:: Skilled nursing facility ?Living Arrangements: Alone ?Available Help at Discharge: Family;Available PRN/intermittently ?Type of Home: Apartment ?Home  Access: Level entry ?Entrance Stairs-Number of Steps: stay  on ground level ?  ?Home Layout: One level ?  ?  ?Bathroom Shower/Tub: Tub/shower unit ?  ?Bathroom Toilet: Handicapped height ?Bathroom Accessibility: Yes ?  ?Home Equipment: Grab bars - tub/shower ?  ?  ?  ? ?  ?Prior Functioning/Environment Prior Level of Function : Independent/Modified Independent ?  ?  ?  ?  ?  ?  ?Mobility Comments: [employer], drives, shops ?ADLs Comments: Modified independent. Pt reports that due to chronic bilateral shoulder issues, he has difficulty getting long sleeved shirts on. Does not wear socks at baseline. ?  ? ?  ?  ?OT Problem List: Decreased strength ?  ?   ?OT Treatment/Interventions: Self-care/ADL training;Therapeutic exercise;Therapeutic activities;Neuromuscular education;DME and/or AE instruction;Energy conservation;Patient/family education;Manual therapy;Modalities;Balance training  ?  ?OT Goals(Current goals can be found in the care plan section) Acute Rehab OT Goals ?Patient Stated Goal: to get stronger ?OT Goal Formulation: With patient ?Time For Goal Achievement: 05/07/21 ?Potential to Achieve Goals: Good  ?OT Frequency: Min 2X/week ?  ? ?   ?AM-PAC OT "6 Clicks" Daily Activity     ?Outcome Measure Help from another person eating meals?: None ?Help from another person taking care of personal grooming?: A Little ?Help from another person toileting, which includes using toliet, bedpan, or urinal?: A Lot ?Help from another person bathing (including washing, rinsing, drying)?: A Lot ?Help from another person to put on and taking off regular upper body clothing?: A Little ?Help from another person to put on and taking off regular lower body clothing?: A Lot ?6 Click Score: 16 ?  ?End of Session   ? ?Activity Tolerance: Patient tolerated treatment well ?Patient left: in chair;with call bell/phone within reach ? ?OT Visit Diagnosis: Muscle weakness (generalized) (M62.81);History of falling (Z91.81)  ?               ?Time: 1610-9604 ?OT Time Calculation (min): 30 min ?Charges:  OT General Charges ?$OT Visit: 1 Visit ?OT Evaluation ?$OT Eval Low Complexity: 1 Low ? ?Limmie Patricia, OTR/L,CBIS  ?[phone] ? ? ?Caddie Randle, Vernona Rieger D ?04/23/2021, 9:22 AM ?

## 2021-04-23 NOTE — Progress Notes (Signed)
Pt transported to [facility] through connecting tunnel via w/c. No further questions from receiving staff at [facility]  ?

## 2021-04-23 NOTE — Progress Notes (Signed)
? ?Progress Note ? ?Patient Name: Mario Massey ?Date of Encounter: 04/23/2021 ? ?[facility] Cardiologist: None Dr. Rennis Golden performed original consult ? ?Subjective  ? ?States that he has a new car waiting for him at home.  Denies any shortness of breath or chest pain.  He states that he previously had had a mild heart attack in the past. ? ?Inpatient Medications  ?  ?Scheduled Meds: ? aspirin EC  81 mg Oral Daily  ? bacitracin   Topical BID  ? cefadroxil  1,000 mg Oral BID  ? Chlorhexidine Gluconate Cloth  6 each Topical Daily  ? feeding supplement  237 mL Oral BID BM  ? influenza vaccine adjuvanted  0.5 mL Intramuscular Once  ? venlafaxine XR  150 mg Oral Daily  ? ziprasidone  20 mg Oral Daily  ? ?Continuous Infusions: ? ?PRN Meds: ?acetaminophen, alum & mag hydroxide-simeth, ondansetron (ZOFRAN) IV, prochlorperazine  ? ?Vital Signs  ?  ?Vitals:  ? 04/22/21 1845 04/22/21 2156 04/23/21 0200 04/23/21 0558  ?BP: 137/72 (!) 124/57 127/68 109/62  ?Pulse: 64 (!) 43 70 (!) 42  ?Resp: 20 20 18 20   ?Temp: 98.4 ?F (36.9 ?C) (!) 97.4 ?F (36.3 ?C) 98.1 ?F (36.7 ?C) 98.2 ?F (36.8 ?C)  ?TempSrc: Oral Oral Oral Oral  ?SpO2: 99% 97% 97% 96%  ?Weight:      ?Height:      ? ? ?Intake/Output Summary (Last 24 hours) at 04/23/2021 04/25/2021 ?Last data filed at 04/23/2021 04/25/2021 ?Gross per 24 hour  ?Intake 240 ml  ?Output 1450 ml  ?Net -1210 ml  ? ? ?  04/20/2021  ?  4:55 AM 04/19/2021  ? 12:00 AM 04/18/2021  ?  8:31 PM  ?Last 3 Weights  ?Weight (lbs) 255 lb 11.7 oz 251 lb 5.2 oz 257 lb 15 oz  ?Weight (kg) 116 kg 114 kg 117 kg  ?   ? ?Telemetry  ?  ?Atrial flutter with variable conduction bradycardic- Personally Reviewed ? ?ECG  ?  ?No new- Personally Reviewed ? ?Physical Exam  ? ?GEN: No acute distress.   ?Neck: No JVD ?Cardiac: Bradycardic slightly irregular, no murmurs, rubs, or gallops.  ?Respiratory: Clear to auscultation bilaterally. ?GI: Soft, nontender, non-distended  ?MS: 2+ bilateral lower extremity edema; No deformity. ?Neuro:   Nonfocal  ?Psych: Normal affect  ? ?Labs  ?  ?High Sensitivity Troponin:   ?Recent Labs  ?Lab 04/18/21 ?2029 04/18/21 ?2234 04/19/21 ?04/21/21 04/19/21 ?04/21/21 04/19/21 ?0755  ?TROPONINIHS 110* 655* 2,362* 2,133* 1,895*  ?   ?Chemistry ?Recent Labs  ?Lab 04/21/21 ?04/23/21 04/22/21 ?0321 04/23/21 ?0510  ?NA 135 137 139  ?K 4.2 4.3 3.7  ?CL 103 107 109  ?CO2 26 27 25   ?GLUCOSE 73 96 88  ?BUN 25* 21 20  ?CREATININE 0.85 0.74 0.63  ?CALCIUM 7.9* 7.9* 8.0*  ?MG 2.4 2.1 1.9  ?PROT 5.6* 5.4* 5.2*  ?ALBUMIN 2.6* 2.5* 2.5*  ?AST 87* 93* 72*  ?ALT 50* 69* 66*  ?ALKPHOS 72 118 114  ?BILITOT 0.8 0.8 0.6  ?GFRNONAA >60 >60 >60  ?ANIONGAP 6 3* 5  ?  ?Lipids No results for input(s): CHOL, TRIG, HDL, LABVLDL, LDLCALC, CHOLHDL in the last 168 hours.  ?Hematology ?Recent Labs  ?Lab 04/21/21 ? 04/22/21 ?0321 04/23/21 ?0510  ?WBC 25.3* 18.7* 10.5  ?RBC 3.49* 3.44* 3.61*  3.63*  ?HGB 11.5* 11.2* 11.9*  ?HCT 34.2* 33.3* 34.8*  ?MCV 98.0 96.8 96.4  ?MCH 33.0 32.6 33.0  ?MCHC 33.6 33.6 34.2  ?RDW 13.4  13.4 13.6  ?PLT 57* 67* 71*  ? ?Thyroid No results for input(s): TSH, FREET4 in the last 168 hours.  ?BNPNo results for input(s): BNP, PROBNP in the last 168 hours.  ?DDimer No results for input(s): DDIMER in the last 168 hours.  ? ?Radiology  ?  ?No results found. ? ?Cardiac Studies  ? ?ECHO ? ? 1. Left ventricular ejection fraction, by estimation, is 55 to 60%. The  ?left ventricle has normal function. The left ventricle has no regional  ?wall motion abnormalities. There is moderate left ventricular hypertrophy.  ?Left ventricular diastolic  ?parameters are indeterminate.  ? 2. Right ventricular systolic function is normal. The right ventricular  ?size is mildly enlarged. There is mildly elevated pulmonary artery  ?systolic pressure. The estimated right ventricular systolic pressure is  ?45.0 mmHg.  ? 3. Left atrial size was moderately dilated.  ? 4. Right atrial size was mildly dilated.  ? 5. The mitral valve is normal in structure. Trivial  mitral valve  ?regurgitation. No evidence of mitral stenosis.  ? 6. The aortic valve is tricuspid. Aortic valve regurgitation is mild. No  ?aortic stenosis is present.  ? 7. Aortic dilatation noted. There is dilatation of the ascending aorta,  ?measuring 40 mm.  ? ?Patient Profile  ?   ?76 y.o. male here with non-ST elevation myocardial infarction in the setting of sepsis, atrial flutter ? ?Assessment & Plan  ?  ?Type II myocardial infarction/demand ischemia ?- Agree that ischemic evaluation can take place as an outpatient.  We will schedule follow-up.  Troponin increased to 2600 and back down. ?-Unable to utilize typical goal-directed medical therapy such as beta-blocker because of hypotension/bradycardia. ?-Currently on aspirin 81 mg-continue to watch closely given thrombocytopenia, platelet count is increasing from 57 up to 71,000. ? ?Coronary artery disease ?- As outpatient, will address ischemic work-up.  This is not the right time with ongoing/resolving sepsis. ? ?Atrial flutter ?- Clearly seen on telemetry currently.  He is not a current candidate for anticoagulation given his ongoing thrombocytopenia.  Does not require cardioversion.  Potentially will auto convert as illness improves. ? ?A-V dissociation ?- Wenkebach, bradycardia noted.  Avoiding AV nodal blocking agents.  Currently in atrial flutter ? ?AKI ?- Improved, 1.65 down to 0.74.  Excellent resuscitation with IV fluids. ? ?E. coli sepsis ?- White count markedly improved.  Fever improved.  Per primary team. ? ?We will go ahead and sign off today.  No further cardiac recommendations at this time.  We will set up follow-up as an outpatient. ? ?For questions or updates, please contact [facility] ?Please consult [email or web address] for contact info under  ? ?  ?   ?Signed, ?Donato Schultz, MD  ?04/23/2021, 9:22 AM   ? ?

## 2021-04-23 NOTE — Progress Notes (Signed)
Pt has discharge orders, report given to [facility] to [facility], pt will be going to room 133. Pt amde aware of move soon and pts daughter is bedside and also aware.  ?

## 2021-04-24 ENCOUNTER — Non-Acute Institutional Stay (SKILLED_NURSING_FACILITY): Payer: No Typology Code available for payment source | Admitting: Adult Health

## 2021-04-24 ENCOUNTER — Encounter: Payer: Self-pay | Admitting: Adult Health

## 2021-04-24 DIAGNOSIS — I4892 Unspecified atrial flutter: Secondary | ICD-10-CM | POA: Diagnosis not present

## 2021-04-24 DIAGNOSIS — I214 Non-ST elevation (NSTEMI) myocardial infarction: Secondary | ICD-10-CM | POA: Diagnosis not present

## 2021-04-24 DIAGNOSIS — E46 Unspecified protein-calorie malnutrition: Secondary | ICD-10-CM

## 2021-04-24 DIAGNOSIS — I441 Atrioventricular block, second degree: Secondary | ICD-10-CM

## 2021-04-24 DIAGNOSIS — I25118 Atherosclerotic heart disease of native coronary artery with other forms of angina pectoris: Secondary | ICD-10-CM

## 2021-04-24 DIAGNOSIS — F319 Bipolar disorder, unspecified: Secondary | ICD-10-CM

## 2021-04-24 DIAGNOSIS — N390 Urinary tract infection, site not specified: Secondary | ICD-10-CM

## 2021-04-24 DIAGNOSIS — E8809 Other disorders of plasma-protein metabolism, not elsewhere classified: Secondary | ICD-10-CM

## 2021-04-24 DIAGNOSIS — R339 Retention of urine, unspecified: Secondary | ICD-10-CM

## 2021-04-24 DIAGNOSIS — A419 Sepsis, unspecified organism: Secondary | ICD-10-CM

## 2021-04-24 DIAGNOSIS — E669 Obesity, unspecified: Secondary | ICD-10-CM

## 2021-04-24 DIAGNOSIS — D696 Thrombocytopenia, unspecified: Secondary | ICD-10-CM

## 2021-04-24 NOTE — Progress Notes (Signed)
?Location:  [place] ?Nursing Home Room Number: [id number] ?Place of Service:  SNF (31) ?Provider:  Ok Edwards, NP ? ?CODE STATUS: FULL CODE ? ?No Known Allergies ? ?Chief Complaint  ?Patient presents with  ? Hospitalization Follow-up  ? ? ?HPI: ? ?He is a 76 year old man who has been hospitalized from 04-18-21 through 04-23-21. His past medical history includes: CAD; hypertension; anemia. He presented to the ED after 2 days of hematuria; vomiting. He was found to have sepsis secondary to UTI(with insignificant growth). He did have a NSTEMI from his sepsis. He is a poor historian. He is here for short term rehab with his goal to return back home. He will be followed for his chronic illnesses including: Atrial flutter unspecified type/wenckebach  NSTEMI (non st elevation mi) /coronary artery disease of native artery of native heart with stable angina: Thrombocytopenia: Hypoalbuminemia due to protein calorie malnutrition ? ?Past Medical History:  ?Diagnosis Date  ? Bipolar disorder ([place])   ? Coronary artery disease   ? Depression   ? Hypertension   ? ? ?Past Surgical History:  ?Procedure Laterality Date  ? KNEE SURGERY    ? with replacement on right knee  ? ? ?Social History  ? ?Socioeconomic History  ? Marital status: Divorced  ?  Spouse name: Not on file  ? Number of children: Not on file  ? Years of education: Not on file  ? Highest education level: Not on file  ?Occupational History  ? Not on file  ?Tobacco Use  ? Smoking status: Never  ? Smokeless tobacco: Never  ?Vaping Use  ? Vaping Use: Never used  ?Substance and Sexual Activity  ? Alcohol use: Yes  ?  Comment: occasional  ? Drug use: No  ? Sexual activity: Not on file  ?Other Topics Concern  ? Not on file  ?Social History Narrative  ? Not on file  ? ?Social Determinants of Health  ? ?Financial Resource Strain: Not on file  ?Food Insecurity: Not on file  ?Transportation Needs: Not on file  ?Physical Activity: Not on file  ?Stress: Not on file  ?Social  Connections: Not on file  ?Intimate Partner Violence: Not on file  ? ?History reviewed. No pertinent family history. ? ? ? ?VITAL SIGNS ?BP 134/75   Pulse 61   Temp (!) 97.4 ?F (36.3 ?C)   Resp 20   Ht 6' (1.829 m)   Wt 253 lb 6.4 oz (114.9 kg)   SpO2 95%   BMI 34.37 kg/m?  ? ?Outpatient Encounter Medications as of 04/24/2021  ?Medication Sig  ? aspirin 81 MG chewable tablet aspirin 81 mg chewable tablet ? Chew 1 tablet every day by oral route.  ? bacitracin ointment Apply topically 2 (two) times daily.  ? cefadroxil (DURICEF) 500 MG capsule Take 2 capsules (1,000 mg total) by mouth 2 (two) times daily for 2 days.  ? feeding supplement (ENSURE ENLIVE / ENSURE PLUS) LIQD Take 237 mLs by mouth 2 (two) times daily between meals.  ? guaifenesin (HUMIBID E) 400 MG TABS tablet Take 1 tablet by mouth 3 (three) times daily as needed.  ? iron polysaccharides (NIFEREX) 150 MG capsule Take 1 capsule (150 mg total) by mouth daily.  ? lisinopril (PRINIVIL,ZESTRIL) 5 MG tablet Take 5 mg by mouth daily.  ? nitroGLYCERIN (NITROSTAT) 0.4 MG SL tablet Place 0.4 mg under the tongue every 5 (five) minutes as needed for chest pain.  ? sodium chloride (OCEAN) 0.65 % nasal spray  Place 1 spray into the nose 2 (two) times daily as needed for congestion.  ? venlafaxine XR (EFFEXOR-XR) 150 MG 24 hr capsule Take 150 mg by mouth daily.  ? vitamin E 180 MG (400 UNITS) capsule Take 1 tablet by mouth daily.  ? ziprasidone (GEODON) 20 MG capsule Take 20 mg by mouth daily.  ? [DISCONTINUED] clotrimazole (LOTRIMIN) 1 % cream Apply to affected area 2 times daily  ? [DISCONTINUED] Multiple Vitamin (MULTIVITAMIN) capsule Take 1 tablet by mouth daily.  ? ?No facility-administered encounter medications on file as of 04/24/2021.  ? ? ? ?SIGNIFICANT DIAGNOSTIC EXAMS ? ?TODAY ? ?04-18-21: chest x-ray: Pulmonary hypoinflation  ? ?04-19-21: ct of abdomen and pelvis:  ?No acute intra-abdominal pathology identified. No definite radiographic explanation for  the patient's reported symptoms. ?Trace bilateral pleural effusions. Mild coronary artery calcification. ?Cholelithiasis. ?Pancolonic diverticulosis without superimposed acute inflammatory change. ?Minimal urolithiasis with 7 mm dependently layering calculus with the bladder. ? ?LABS REVIEWED: TODAY  ? ?04-18-21: wbc 1.5; hgb 1.47; hct 43.5; mcv 96.5 plt 94; glucose 109; bun 23; creat 1.15; k+ 4.0; na++ 139; ca 8.9; GFR>60; protein 6.7; albumin 3.6; blood culture: no growth; urine culture: <10,000.  ?04-20-21: wbc 29.9; hgb 11.7; hct 34.9; mcv 97.2 plt 58; glucose 69; bun 30; creat 0.99; k+ 4.3; na++ 135; ca 7.6; GFR >60; protein 5.7; albumin 2.7; ast 119; alt 57; mag 2.1  ?04-24-10: vit B 12: 2092; folate 10.6; iron 27; tibc 234; ferritin 185  ? ?Review of Systems  ?Constitutional:  Negative for malaise/fatigue.  ?Respiratory:  Negative for cough and shortness of breath.   ?Cardiovascular:  Negative for chest pain, palpitations and leg swelling.  ?Gastrointestinal:  Negative for abdominal pain, constipation and heartburn.  ?Musculoskeletal:  Negative for back pain, joint pain and myalgias.  ?Skin: Negative.   ?Neurological:  Negative for dizziness.  ?Psychiatric/Behavioral:  The patient is not nervous/anxious.   ? ?Physical Exam ?Constitutional:   ?   General: He is not in acute distress. ?   Appearance: He is well-developed. He is obese. He is not diaphoretic.  ?Neck:  ?   Thyroid: No thyromegaly.  ?Cardiovascular:  ?   Rate and Rhythm: Normal rate and regular rhythm.  ?   Pulses: Normal pulses.  ?   Heart sounds: Normal heart sounds.  ?Pulmonary:  ?   Effort: Pulmonary effort is normal. No respiratory distress.  ?   Breath sounds: Normal breath sounds.  ?Abdominal:  ?   General: Bowel sounds are normal. There is no distension.  ?   Palpations: Abdomen is soft.  ?   Tenderness: There is no abdominal tenderness.  ?Musculoskeletal:     ?   General: Normal range of motion.  ?   Cervical back: Neck supple.   ?Lymphadenopathy:  ?   Cervical: No cervical adenopathy.  ?Skin: ?   General: Skin is warm and dry.  ?Neurological:  ?   Mental Status: He is alert. Mental status is at baseline.  ?Psychiatric:     ?   Mood and Affect: Mood normal.  ? ? ? ?ASSESSMENT/ PLAN: ? ?TODAY ? ?Sepsis secondary to UTI: (insignificant growth)  will complete duricef 1 gm twice daily for a total of 2 days. Will monitor  ? ?2. Atrial flutter unspecified type/wenckebach : will continue asa 81 mg daily  ? ?3. 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 ?4. Thrombocytopenia: plt is 58 will monitor  ? ?5. Hypoalbuminemia due to protein calorie malnutrition: albumin 2.7 protein 5.7 will continue supplements as directed ? ?6. Obesity (BMI 30.0-39.9) will monitor  ? ?7. Bipolar affective disorder remission status unspecified will continue geodon 20 mg daily and effexor xr 150 mg daily  ? ?8. Urine retention: has long term foley  ? ?Will repeat cbc and CMP mag and phos 04-30-21.  ? ? ?Ok Edwards NP ?[facility]  ?call [phone]   ?

## 2021-04-25 ENCOUNTER — Non-Acute Institutional Stay (SKILLED_NURSING_FACILITY): Payer: No Typology Code available for payment source | Admitting: Internal Medicine

## 2021-04-25 ENCOUNTER — Encounter: Payer: Self-pay | Admitting: Internal Medicine

## 2021-04-25 DIAGNOSIS — N390 Urinary tract infection, site not specified: Secondary | ICD-10-CM

## 2021-04-25 DIAGNOSIS — I214 Non-ST elevation (NSTEMI) myocardial infarction: Secondary | ICD-10-CM

## 2021-04-25 DIAGNOSIS — A419 Sepsis, unspecified organism: Secondary | ICD-10-CM

## 2021-04-25 DIAGNOSIS — R21 Rash and other nonspecific skin eruption: Secondary | ICD-10-CM

## 2021-04-25 DIAGNOSIS — E440 Moderate protein-calorie malnutrition: Secondary | ICD-10-CM

## 2021-04-25 DIAGNOSIS — R7401 Elevation of levels of liver transaminase levels: Secondary | ICD-10-CM | POA: Diagnosis not present

## 2021-04-25 DIAGNOSIS — E46 Unspecified protein-calorie malnutrition: Secondary | ICD-10-CM | POA: Insufficient documentation

## 2021-04-25 DIAGNOSIS — G4733 Obstructive sleep apnea (adult) (pediatric): Secondary | ICD-10-CM | POA: Insufficient documentation

## 2021-04-25 NOTE — Assessment & Plan Note (Addendum)
Clinically resolved; antibiotics course to be completed today.  He remains afebrile. ?

## 2021-04-25 NOTE — Assessment & Plan Note (Addendum)
Despite obesity; protein/caloric malnutrition present clinically based on labs and exam. ?Total protein 5.6 and albumin 3.0.  Interosseous wasting noted of the hands. ?Nutrition to follow @ SNF. ? ?

## 2021-04-25 NOTE — Assessment & Plan Note (Addendum)
Currently denies any anginal symptoms.Cardiology F/U with Dr Rennis Golden. Notify [facility] of OSA suspicion. ?

## 2021-04-25 NOTE — Patient Instructions (Signed)
See assessment and plan under each diagnosis in the problem list and acutely for this visit 

## 2021-04-25 NOTE — Assessment & Plan Note (Signed)
OSA evaluation post discharge from the SNF. ?

## 2021-04-25 NOTE — Assessment & Plan Note (Addendum)
He states it has been present for 2 weeks and is progressing.  Clinically it essentially is midline and over the right lateral upper lip.  Herpetic rash is suggested; but he denies any associated pain.? Sepsis related. ?Monitor response to Bacitracin topically bid. ?

## 2021-04-25 NOTE — Assessment & Plan Note (Signed)
Liver enzymes are trending down with AST 72 and ALT 66 at discharge.  No scleral icterus or jaundice present. ?

## 2021-04-25 NOTE — Progress Notes (Signed)
? ?NURSING HOME LOCATION:  [place] ?ROOM NUMBER:  [id number] ? ?CODE STATUS:  Full Code ? ?PCP:  [facility] , [place] ,[place] ? ?This is a comprehensive admission note to this SNFperformed on this date less than 30 days from date of admission. ?Included are preadmission medical/surgical history; reconciled medication list; family history; social history and comprehensive review of systems.  ?Corrections and additions to the records were documented. Comprehensive physical exam was also performed. Additionally a clinical summary was entered for each active diagnosis pertinent to this admission in the Problem List to enhance continuity of care. ? ?HPI: He was hospitalized 3/22 - 04/23/2021 presenting via EMS with a 2-day history of vomiting.  Blood was noted in his Foley bag.  In the ED temp was 103.  Patient was unable to provide any more history; there was possible history of dementia. ?Sepsis was clinically present with tachypnea and tachycardia and soft blood pressure.  White count was 1500 and platelet count 94,000.  There was no anemia.  Empiric IV vanc, Flagyl & Cefepime were initiated. ?AKI was present;creatinine peaked @ 1.65 and GFR nadir was 43. Lactic acid was 3. White count subsequently peaked at 29,900.  Transaminitis was present  with AST peak of 124 & ALT of 69. These did trend down. ?Blood cultures were positive  for E coli, but urine culture revealed less than 10,000 colonies, insignificant growth.  ?NSTEMI in context of sepsis induced demand ischemia was diagnosed. Troponin peaked at 2362. Course was also associated with flutter, new RBBB, & Wenkebach phenomenon. ?At discharge AST was 72 and ALT 66. ?Protein/caloric malnutrition was suggested by total protein of 5.6 and albumin of 3.0. ?Sepsis picture resolved, but PT/OT recommended SNF for rehab. ? ?Past medical and surgical history:Includes essential hypertension, CAD, bipolar disorder, and depression. ?Surgeries: Includes knee  surgery ?Social history: Occasional alcohol intake; never smoker. ? ?Family history:reviewed ?  ?Review of systems: Clinical neurocognitive deficits made validity of responses questionable.  Although he was able to name the month and the year he could not name his physician.  He also seemed very confused about his hospitalization.  He did state "had a heart attack 4-5 months ago".  He does complain of polydipsia.  He states that he has had a Foley catheter for 1.5 years.  He states that the lesions around his nose and mouth have been present for 2 weeks and are getting worse.  He states that these are not painful.  He seems to "think" he was told he had sleep apnea (see PE below).  He has never been on CPAP.  ? ?Constitutional: No fever, significant weight change  ?Eyes: No redness, discharge, pain, vision change ?ENT/mouth: No nasal congestion, purulent discharge, earache, change in hearing, sore throat  ?Cardiovascular: No chest pain, palpitations, paroxysmal nocturnal dyspnea, claudication, edema  ?Respiratory: No cough, sputum production, hemoptysis, DOE. ?Gastrointestinal: No heartburn, dysphagia, abdominal pain, nausea /vomiting, rectal bleeding, melena, change in bowels ?Genitourinary: No dysuria, hematuria, pyuria ?Musculoskeletal: No joint stiffness, joint swelling, weakness, pain ?Neurologic: No dizziness, headache, syncope, seizures, numbness, tingling ?Psychiatric: No significant anxiety, depression, insomnia, anorexia ?Endocrine: No change in hair/skin/nails, excessive thirst, excessive hunger, excessive urination  ?Hematologic/lymphatic: No significant bruising, lymphadenopathy, abnormal bleeding ?Allergy/immunology: No itchy/watery eyes, significant sneezing, urticaria, angioedema ? ?Physical exam:  ?Pertinent or positive findings: When I entered the room he was sleeping soundly exhibiting extremely loud snoring, hypopnea, and frank apnea. He has multiple rounded lesions over the bridge of the nose  and a few at the right lateral upper lip area which appear papular and eschar like. No vesicles present. He also has similar lesions inside the nostrils. Oropharynx is crowded. Heart is slow and slightly irregular.  While asleep very loud snoring obscured heart sounds. Once awake low-grade rhonchi were noted bilaterally.  Abdomen is massive.  Pedal pulses were strong.  He has interosseous wasting of the hands.  He has bruising over the dorsum of the right hand and in scattered distribution over the right forearm.  He exhibited myoclonic jerking of the left foot and the hands while asleep. ? ?General appearance:  no acute distress, increased work of breathing is present.   ?Lymphatic: No lymphadenopathy about the head, neck, axilla. ?Eyes: No conjunctival inflammation or lid edema is present. There is no scleral icterus. ?Ears:  External ear exam shows no significant lesions or deformities.   ?Nose:  External nasal examination shows no deformity or cellulitis.  ?Oral exam: Lips and gums are healthy appearing.There is no oropharyngeal erythema or exudate. ?Neck:  No thyromegaly, masses, tenderness noted.    ?Heart:  No gallop, murmur, click, rub.  ?Lungs:  without wheezes, rales, rubs. ?Abdomen: Bowel sounds are normal.  Abdomen is soft and nontender with no organomegaly, hernias, masses. ?GU: Deferred  ?Extremities:  No cyanosis, clubbing, edema. ?Neurologic exam:  Balance, Rhomberg, finger to nose testing could not be completed due to clinical state ?Skin: Warm & dry w/o tenting. ? ?See clinical summary under each active problem in the Problem List with associated updated therapeutic plan ? ?

## 2021-04-26 DIAGNOSIS — F3160 Bipolar disorder, current episode mixed, unspecified: Secondary | ICD-10-CM | POA: Insufficient documentation

## 2021-04-26 DIAGNOSIS — F339 Major depressive disorder, recurrent, unspecified: Secondary | ICD-10-CM | POA: Insufficient documentation

## 2021-04-26 DIAGNOSIS — F319 Bipolar disorder, unspecified: Secondary | ICD-10-CM | POA: Insufficient documentation

## 2021-04-26 DIAGNOSIS — R339 Retention of urine, unspecified: Secondary | ICD-10-CM | POA: Insufficient documentation

## 2021-04-27 ENCOUNTER — Non-Acute Institutional Stay (SKILLED_NURSING_FACILITY): Payer: No Typology Code available for payment source | Admitting: Adult Health

## 2021-04-27 DIAGNOSIS — J189 Pneumonia, unspecified organism: Secondary | ICD-10-CM | POA: Diagnosis not present

## 2021-04-30 ENCOUNTER — Encounter: Payer: Self-pay | Admitting: Adult Health

## 2021-04-30 ENCOUNTER — Other Ambulatory Visit (HOSPITAL_COMMUNITY)
Admission: RE | Admit: 2021-04-30 | Discharge: 2021-04-30 | Disposition: A | Discharge status: Home / Self Care | Payer: No Typology Code available for payment source | Source: Skilled Nursing Facility | Attending: Adult Health | Admitting: Adult Health

## 2021-04-30 ENCOUNTER — Non-Acute Institutional Stay (INDEPENDENT_AMBULATORY_CARE_PROVIDER_SITE_OTHER): Payer: No Typology Code available for payment source | Admitting: Adult Health

## 2021-04-30 DIAGNOSIS — D6869 Other thrombophilia: Secondary | ICD-10-CM

## 2021-04-30 DIAGNOSIS — R768 Other specified abnormal immunological findings in serum: Secondary | ICD-10-CM | POA: Diagnosis not present

## 2021-04-30 DIAGNOSIS — I214 Non-ST elevation (NSTEMI) myocardial infarction: Secondary | ICD-10-CM | POA: Insufficient documentation

## 2021-04-30 DIAGNOSIS — Z28310 Unvaccinated for covid-19: Secondary | ICD-10-CM | POA: Insufficient documentation

## 2021-04-30 DIAGNOSIS — U071 COVID-19: Secondary | ICD-10-CM | POA: Diagnosis not present

## 2021-04-30 DIAGNOSIS — R7982 Elevated C-reactive protein (CRP): Secondary | ICD-10-CM

## 2021-04-30 LAB — COMPREHENSIVE METABOLIC PANEL
ALT: 29 U/L (ref 0–44)
AST: 28 U/L (ref 15–41)
Albumin: 3.2 g/dL — ABNORMAL LOW (ref 3.5–5.0)
Alkaline Phosphatase: 72 U/L (ref 38–126)
Anion gap: 9 (ref 5–15)
BUN: 17 mg/dL (ref 8–23)
CO2: 24 mmol/L (ref 22–32)
Calcium: 8.5 mg/dL — ABNORMAL LOW (ref 8.9–10.3)
Chloride: 103 mmol/L (ref 98–111)
Creatinine, Ser: 0.67 mg/dL (ref 0.61–1.24)
GFR, Estimated: >60 mL/min (ref >60)
Glucose, Bld: 95 mg/dL (ref 70–99)
Potassium: 3.8 mmol/L (ref 3.5–5.1)
Sodium: 136 mmol/L (ref 135–145)
Total Bilirubin: 0.8 mg/dL (ref 0.3–1.2)
Total Protein: 6.9 g/dL (ref 6.5–8.1)

## 2021-04-30 LAB — C-REACTIVE PROTEIN: CRP: 4.2 mg/dL — ABNORMAL HIGH (ref <1.0)

## 2021-04-30 LAB — CBC
HCT: 42.3 % (ref 39.0–52.0)
Hemoglobin: 13.9 g/dL (ref 13.0–17.0)
MCH: 30.9 pg (ref 26.0–34.0)
MCHC: 32.9 g/dL (ref 30.0–36.0)
MCV: 94 fL (ref 80.0–100.0)
Platelets: 244 10*3/uL (ref 150–400)
RBC: 4.5 MIL/uL (ref 4.22–5.81)
RDW: 13.4 % (ref 11.5–15.5)
WBC: 5.3 10*3/uL (ref 4.0–10.5)
nRBC: 0 % (ref 0.0–0.2)

## 2021-04-30 LAB — MAGNESIUM: Magnesium: 1.9 mg/dL (ref 1.7–2.4)

## 2021-04-30 LAB — D-DIMER, QUANTITATIVE: D-Dimer, Quant: 2.99 ug/mL-FEU — ABNORMAL HIGH (ref 0.00–0.50)

## 2021-04-30 LAB — PHOSPHORUS: Phosphorus: 3.3 mg/dL (ref 2.5–4.6)

## 2021-04-30 NOTE — Progress Notes (Signed)
?Location:  [place] ?Nursing Home Room Number: 133-P ?Place of Service:  SNF (31) ? ? ?CODE STATUS: Full Code ? ?No Known Allergies ? ?Chief Complaint  ?Patient presents with  ? Acute Visit  ?  COVID positive   ? ? ?HPI: ? ?He has tested positive for covid. His chest x-ray is normal. There are no reports of fevers present. No cough or shortness of breath. His family has declined antiviral treatment. His crp and d-dimer are elevated.  ? ?Past Medical History:  ?Diagnosis Date  ? Bipolar disorder ([place])   ? Coronary artery disease   ? Depression   ? Hypertension   ? ? ?Past Surgical History:  ?Procedure Laterality Date  ? KNEE SURGERY    ? with replacement on right knee  ? ? ?Social History  ? ?Socioeconomic History  ? Marital status: Divorced  ?  Spouse name: Not on file  ? Number of children: Not on file  ? Years of education: Not on file  ? Highest education level: Not on file  ?Occupational History  ? Not on file  ?Tobacco Use  ? Smoking status: Never  ? Smokeless tobacco: Never  ?Vaping Use  ? Vaping Use: Never used  ?Substance and Sexual Activity  ? Alcohol use: Yes  ?  Comment: occasional  ? Drug use: No  ? Sexual activity: Not on file  ?Other Topics Concern  ? Not on file  ?Social History Narrative  ? Not on file  ? ?Social Determinants of Health  ? ?Financial Resource Strain: Not on file  ?Food Insecurity: Not on file  ?Transportation Needs: Not on file  ?Physical Activity: Not on file  ?Stress: Not on file  ?Social Connections: Not on file  ?Intimate Partner Violence: Not on file  ? ?History reviewed. No pertinent family history. ? ? ? ?VITAL SIGNS ?BP 115/75   Pulse 81   Temp (!) 97.3 ?F (36.3 ?C)   Resp 20   Ht 6' (1.829 m)   Wt 248 lb 11.2 oz (112.8 kg)   SpO2 93%   BMI 33.73 kg/m?  ? ?Outpatient Encounter Medications as of 04/30/2021  ?Medication Sig  ? amoxicillin-clavulanate (AUGMENTIN) 875-125 MG tablet Take 1 tablet by mouth 2 (two) times daily.  ? apixaban (ELIQUIS) 2.5 MG TABS  tablet Take 2.5 mg by mouth 2 (two) times daily. for d-dimer 2.99  ? aspirin 81 MG chewable tablet aspirin 81 mg chewable tablet ? Chew 1 tablet every day by oral route.  ? azithromycin (ZITHROMAX) 500 MG tablet Take 500 mg by mouth daily.  ? bacitracin ointment Apply topically 2 (two) times daily. (Patient taking differently: Apply topically 2 (two) times daily. Apply topically to urethral meatus.  ?Apply to sores of nose and corner of mouth.)  ? benzonatate (TESSALON) 200 MG capsule Take 200 mg by mouth every 8 (eight) hours as needed for cough. Congestion  ? feeding supplement (ENSURE ENLIVE / ENSURE PLUS) LIQD Take 237 mLs by mouth 2 (two) times daily between meals.  ? guaifenesin (HUMIBID E) 400 MG TABS tablet Take 1 tablet by mouth 3 (three) times daily as needed.  ? iron polysaccharides (NIFEREX) 150 MG capsule Take 1 capsule (150 mg total) by mouth daily.  ? lisinopril (PRINIVIL,ZESTRIL) 5 MG tablet Take 5 mg by mouth daily.  ? nitroGLYCERIN (NITROSTAT) 0.4 MG SL tablet Place 0.4 mg under the tongue every 5 (five) minutes as needed for chest pain.  ? predniSONE (DELTASONE) 20 MG tablet Take  20 mg by mouth in the morning and at bedtime. for elevated CRP4.2  ? sodium chloride (OCEAN) 0.65 % nasal spray Place 1 spray into the nose 2 (two) times daily as needed for congestion.  ? Throat Lozenges (ZINC W/A&C) LOZG Use as directed in the mouth or throat. (vit a-vit c-zinc-propolis) lozenge; - ; oral ?Special Instructions: Give 5x/day x 2 weeks. ?5 Times Per Day  ? venlafaxine XR (EFFEXOR-XR) 150 MG 24 hr capsule Take 150 mg by mouth daily.  ? vitamin C (ASCORBIC ACID) 500 MG tablet Take 500 mg by mouth 2 (two) times daily.  ? vitamin E 180 MG (400 UNITS) capsule Take 1 tablet by mouth daily.  ? ziprasidone (GEODON) 20 MG capsule Take 20 mg by mouth daily.  ? ?No facility-administered encounter medications on file as of 04/30/2021.  ? ? ? ?SIGNIFICANT DIAGNOSTIC EXAMS ? ?PREVIOUS  ? ?04-18-21: chest x-ray: Pulmonary  hypoinflation  ? ?04-19-21: ct of abdomen and pelvis:  ?No acute intra-abdominal pathology identified. No definite radiographic explanation for the patient's reported symptoms. ?Trace bilateral pleural effusions. Mild coronary artery calcification. ?Cholelithiasis. ?Pancolonic diverticulosis without superimposed acute inflammatory change. ?Minimal urolithiasis with 7 mm dependently layering calculus with the bladder. ? ?TODAY ? ?04-29-21: chest x-ray: normal  ? ?LABS REVIEWED: PREVIOUS   ? ?04-18-21: wbc 1.5; hgb 1.47; hct 43.5; mcv 96.5 plt 94; glucose 109; bun 23; creat 1.15; k+ 4.0; na++ 139; ca 8.9; GFR>60; protein 6.7; albumin 3.6; blood culture: no growth; urine culture: <10,000.  ?04-20-21: wbc 29.9; hgb 11.7; hct 34.9; mcv 97.2 plt 58; glucose 69; bun 30; creat 0.99; k+ 4.3; na++ 135; ca 7.6; GFR >60; protein 5.7; albumin 2.7; ast 119; alt 57; mag 2.1  ?04-24-10: vit B 12: 2092; folate 10.6; iron 27; tibc 234; ferritin 185 ? ?TODAY ? ?04-30-21: wbc 5.3; hgb 13.9; hct 42.3; mcv 94.0 plt 244; glucose 95; bun 17; creat 0.67; k+ 3.8; na++ 136; ca 8.5; GFR>60; protein 6.9; albumin 3.2; mag 1.9 phos 3.3; d-dimer 2.99 CRP 4.2  ? ? ?Review of Systems  ?Constitutional:  Negative for malaise/fatigue.  ?Respiratory:  Negative for cough and shortness of breath.   ?Cardiovascular:  Negative for chest pain, palpitations and leg swelling.  ?Gastrointestinal:  Negative for abdominal pain, constipation and heartburn.  ?Musculoskeletal:  Negative for back pain, joint pain and myalgias.  ?Skin: Negative.   ?Neurological:  Negative for dizziness.  ?Psychiatric/Behavioral:  The patient is not nervous/anxious.   ? ? ?Physical Exam ?Constitutional:   ?   General: He is not in acute distress. ?   Appearance: He is well-developed. He is obese. He is not diaphoretic.  ?Neck:  ?   Thyroid: No thyromegaly.  ?Cardiovascular:  ?   Rate and Rhythm: Normal rate and regular rhythm.  ?   Pulses: Normal pulses.  ?   Heart sounds: Normal heart sounds.   ?Pulmonary:  ?   Effort: Pulmonary effort is normal. No respiratory distress.  ?   Breath sounds: Rhonchi present.  ?   Comments: Scattered  ?Abdominal:  ?   General: Bowel sounds are normal. There is no distension.  ?   Palpations: Abdomen is soft.  ?   Tenderness: There is no abdominal tenderness.  ?Musculoskeletal:     ?   General: Normal range of motion.  ?   Cervical back: Neck supple.  ?   Right lower leg: No edema.  ?   Left lower leg: No edema.  ?Lymphadenopathy:  ?  Cervical: No cervical adenopathy.  ?Skin: ?   General: Skin is warm and dry.  ?Neurological:  ?   Mental Status: He is alert. Mental status is at baseline.  ?Psychiatric:     ?   Mood and Affect: Mood normal.  ? ? ? ?ASSESSMENT/ PLAN: ? ?TODAY ? ?Sars cov 2 antibody positive ?Elevated C reactive protein (CRP) ?Hypercoagulable state associated with covid 19  ? ?Will begin eliquis 2.5 mg twice daily  ?Prednisone 20 mg twice daily for 5 days ?Will repeat CRP and d-dimer 05-04-21.  ? ?Will monitor  ? ? ?Ok Edwards NP ?[facility]  ?call [phone]  ? ?

## 2021-05-02 ENCOUNTER — Encounter: Payer: Self-pay | Admitting: Adult Health

## 2021-05-02 DIAGNOSIS — J189 Pneumonia, unspecified organism: Secondary | ICD-10-CM | POA: Insufficient documentation

## 2021-05-02 NOTE — Progress Notes (Signed)
? ?Location:  [place] ?Nursing Home Room Number: [id number] ?Place of Service:  SNF (31) ? ? ?CODE STATUS: full code  ? ?No Known Allergies ? ?Chief Complaint  ?Patient presents with  ? Acute Visit  ?  Cough and congestion   ? ? ?HPI: ? ?He is complaining of cough and congestion for "10 days". He has some shortness of breath present. No chest pain; has headache; no fevers; no sputum.  ? ?Past Medical History:  ?Diagnosis Date  ? Bipolar disorder ([place])   ? Coronary artery disease   ? Depression   ? Hypertension   ? ? ?Past Surgical History:  ?Procedure Laterality Date  ? KNEE SURGERY    ? with replacement on right knee  ? ? ?Social History  ? ?Socioeconomic History  ? Marital status: Divorced  ?  Spouse name: Not on file  ? Number of children: Not on file  ? Years of education: Not on file  ? Highest education level: Not on file  ?Occupational History  ? Not on file  ?Tobacco Use  ? Smoking status: Never  ? Smokeless tobacco: Never  ?Vaping Use  ? Vaping Use: Never used  ?Substance and Sexual Activity  ? Alcohol use: Yes  ?  Comment: occasional  ? Drug use: No  ? Sexual activity: Not on file  ?Other Topics Concern  ? Not on file  ?Social History Narrative  ? Not on file  ? ?Social Determinants of Health  ? ?Financial Resource Strain: Not on file  ?Food Insecurity: Not on file  ?Transportation Needs: Not on file  ?Physical Activity: Not on file  ?Stress: Not on file  ?Social Connections: Not on file  ?Intimate Partner Violence: Not on file  ? ?No family history on file. ? ? ? ?VITAL SIGNS ?BP 132/75   Pulse 80   Temp 98.6 ?F (37 ?C)   Ht 6' (1.829 m)   Wt 248 lb (112.5 kg)   BMI 33.63 kg/m?  ? ?Outpatient Encounter Medications as of 04/27/2021  ?Medication Sig  ? aspirin 81 MG chewable tablet aspirin 81 mg chewable tablet ? Chew 1 tablet every day by oral route.  ? bacitracin ointment Apply topically 2 (two) times daily. (Patient taking differently: Apply topically 2 (two) times daily. Apply topically to  urethral meatus.  ?Apply to sores of nose and corner of mouth.)  ? feeding supplement (ENSURE ENLIVE / ENSURE PLUS) LIQD Take 237 mLs by mouth 2 (two) times daily between meals.  ? guaifenesin (HUMIBID E) 400 MG TABS tablet Take 1 tablet by mouth 3 (three) times daily as needed.  ? iron polysaccharides (NIFEREX) 150 MG capsule Take 1 capsule (150 mg total) by mouth daily.  ? lisinopril (PRINIVIL,ZESTRIL) 5 MG tablet Take 5 mg by mouth daily.  ? nitroGLYCERIN (NITROSTAT) 0.4 MG SL tablet Place 0.4 mg under the tongue every 5 (five) minutes as needed for chest pain.  ? sodium chloride (OCEAN) 0.65 % nasal spray Place 1 spray into the nose 2 (two) times daily as needed for congestion.  ? venlafaxine XR (EFFEXOR-XR) 150 MG 24 hr capsule Take 150 mg by mouth daily.  ? vitamin E 180 MG (400 UNITS) capsule Take 1 tablet by mouth daily.  ? ziprasidone (GEODON) 20 MG capsule Take 20 mg by mouth daily.  ? ?No facility-administered encounter medications on file as of 04/27/2021.  ? ? ? ?SIGNIFICANT DIAGNOSTIC EXAMS ? ?TODAY ? ?04-18-21: chest x-ray: Pulmonary hypoinflation  ? ?04-19-21: ct  of abdomen and pelvis:  ?No acute intra-abdominal pathology identified. No definite radiographic explanation for the patient's reported symptoms. ?Trace bilateral pleural effusions. Mild coronary artery calcification. ?Cholelithiasis. ?Pancolonic diverticulosis without superimposed acute inflammatory change. ?Minimal urolithiasis with 7 mm dependently layering calculus with the bladder. ? ?LABS REVIEWED: TODAY  ? ?04-18-21: wbc 1.5; hgb 1.47; hct 43.5; mcv 96.5 plt 94; glucose 109; bun 23; creat 1.15; k+ 4.0; na++ 139; ca 8.9; GFR>60; protein 6.7; albumin 3.6; blood culture: no growth; urine culture: <10,000.  ?04-20-21: wbc 29.9; hgb 11.7; hct 34.9; mcv 97.2 plt 58; glucose 69; bun 30; creat 0.99; k+ 4.3; na++ 135; ca 7.6; GFR >60; protein 5.7; albumin 2.7; ast 119; alt 57; mag 2.1  ?04-24-10: vit B 12: 2092; folate 10.6; iron 27; tibc 234;  ferritin 185  ? ?Review of Systems  ?Constitutional:  Positive for malaise/fatigue. Negative for fever.  ?Respiratory:  Positive for cough, shortness of breath and wheezing.   ?Cardiovascular:  Negative for chest pain, palpitations and leg swelling.  ?Gastrointestinal:  Negative for abdominal pain, constipation and heartburn.  ?Musculoskeletal:  Negative for back pain, joint pain and myalgias.  ?Skin: Negative.   ?Neurological:  Positive for headaches. Negative for dizziness.  ?Psychiatric/Behavioral:  The patient is not nervous/anxious.   ? ?Physical Exam ?Constitutional:   ?   General: He is not in acute distress. ?   Appearance: He is well-developed. He is obese. He is not diaphoretic.  ?Neck:  ?   Thyroid: No thyromegaly.  ?Cardiovascular:  ?   Rate and Rhythm: Normal rate and regular rhythm.  ?   Heart sounds: Normal heart sounds.  ?Pulmonary:  ?   Effort: Pulmonary effort is normal. No respiratory distress.  ?   Breath sounds: Rhonchi and rales present.  ?Abdominal:  ?   General: Bowel sounds are normal. There is no distension.  ?   Palpations: Abdomen is soft.  ?   Tenderness: There is no abdominal tenderness.  ?Musculoskeletal:     ?   General: Normal range of motion.  ?   Cervical back: Neck supple.  ?   Right lower leg: No edema.  ?   Left lower leg: No edema.  ?Lymphadenopathy:  ?   Cervical: No cervical adenopathy.  ?Skin: ?   General: Skin is warm and dry.  ?Neurological:  ?   Mental Status: He is alert. Mental status is at baseline.  ?Psychiatric:     ?   Mood and Affect: Mood normal.  ? ? ? ?ASSESSMENT/ PLAN: ? ?TODAY ? ?HCAP (health care associated pneumonia): will begin agumentin 875 mg twice daily for 10 days zithromax 500 mg daily for 5 days; tessalon perle 200 mg every 8 hours as needed will monitor  ? ? ?Ok Edwards NP ?[facility]  ? call [phone]  ? ?

## 2021-05-03 DIAGNOSIS — R7982 Elevated C-reactive protein (CRP): Secondary | ICD-10-CM | POA: Insufficient documentation

## 2021-05-03 DIAGNOSIS — U071 COVID-19: Secondary | ICD-10-CM | POA: Insufficient documentation

## 2021-05-03 DIAGNOSIS — R768 Other specified abnormal immunological findings in serum: Secondary | ICD-10-CM | POA: Insufficient documentation

## 2021-05-03 DIAGNOSIS — D6869 Other thrombophilia: Secondary | ICD-10-CM | POA: Insufficient documentation

## 2021-05-07 ENCOUNTER — Other Ambulatory Visit (HOSPITAL_COMMUNITY)
Admission: RE | Admit: 2021-05-07 | Discharge: 2021-05-07 | Disposition: A | Discharge status: Home / Self Care | Payer: No Typology Code available for payment source | Source: Skilled Nursing Facility | Attending: Adult Health | Admitting: Adult Health

## 2021-05-07 DIAGNOSIS — R7989 Other specified abnormal findings of blood chemistry: Secondary | ICD-10-CM | POA: Insufficient documentation

## 2021-05-07 LAB — D-DIMER, QUANTITATIVE: D-Dimer, Quant: 11.05 ug/mL-FEU — ABNORMAL HIGH (ref 0.00–0.50)

## 2021-05-08 ENCOUNTER — Non-Acute Institutional Stay (SKILLED_NURSING_FACILITY): Payer: Self-pay | Admitting: Adult Health

## 2021-05-08 ENCOUNTER — Encounter: Payer: Self-pay | Admitting: Adult Health

## 2021-05-08 DIAGNOSIS — U071 COVID-19: Secondary | ICD-10-CM

## 2021-05-08 DIAGNOSIS — D6869 Other thrombophilia: Secondary | ICD-10-CM

## 2021-05-08 NOTE — Progress Notes (Signed)
? ?Location:  [facility] ?Nursing Home Room Number: 133 ?Place of Service:  SNF (31) ? ? ?CODE STATUS: full code  ? ?No Known Allergies ? ?Chief Complaint  ?Patient presents with  ? Acute Visit  ?  Follow up labs   ? ? ?HPI: ? ?His d-dimer is elevated at 11.05. there are no indications of abnormal clotting present. He is presently taking 2.5 mg twice daily of eliquis. This will need to be increased. He denies any chest pain or shortness of breath.  ? ?Past Medical History:  ?Diagnosis Date  ? Bipolar disorder (HCC)   ? Coronary artery disease   ? Depression   ? Hypertension   ? ? ?Past Surgical History:  ?Procedure Laterality Date  ? KNEE SURGERY    ? with replacement on right knee  ? ? ?Social History  ? ?Socioeconomic History  ? Marital status: Divorced  ?  Spouse name: Not on file  ? Number of children: Not on file  ? Years of education: Not on file  ? Highest education level: Not on file  ?Occupational History  ? Not on file  ?Tobacco Use  ? Smoking status: Never  ? Smokeless tobacco: Never  ?Vaping Use  ? Vaping Use: Never used  ?Substance and Sexual Activity  ? Alcohol use: Yes  ?  Comment: occasional  ? Drug use: No  ? Sexual activity: Not on file  ?Other Topics Concern  ? Not on file  ?Social History Narrative  ? Not on file  ? ?Social Determinants of Health  ? ?Financial Resource Strain: Not on file  ?Food Insecurity: Not on file  ?Transportation Needs: Not on file  ?Physical Activity: Not on file  ?Stress: Not on file  ?Social Connections: Not on file  ?Intimate Partner Violence: Not on file  ? ?No family history on file. ? ? ? ?VITAL SIGNS ?BP 98/82   Pulse 80   Temp (!) 97.2 ?F (36.2 ?C)   Resp 20   Ht 6' (1.829 m)   Wt 248 lb 11.2 oz (112.8 kg)   BMI 33.73 kg/m?  ? ?Outpatient Encounter Medications as of 05/08/2021  ?Medication Sig  ? apixaban (ELIQUIS) 2.5 MG TABS tablet Take 2.5 mg by mouth 2 (two) times daily. for d-dimer 2.99  ? aspirin 81 MG chewable tablet aspirin 81 mg chewable  tablet ? Chew 1 tablet every day by oral route.  ? azithromycin (ZITHROMAX) 500 MG tablet Take 500 mg by mouth daily.  ? bacitracin ointment Apply topically 2 (two) times daily. (Patient taking differently: Apply topically 2 (two) times daily. Apply topically to urethral meatus.  ?Apply to sores of nose and corner of mouth.)  ? benzonatate (TESSALON) 200 MG capsule Take 200 mg by mouth every 8 (eight) hours as needed for cough. Congestion  ? feeding supplement (ENSURE ENLIVE / ENSURE PLUS) LIQD Take 237 mLs by mouth 2 (two) times daily between meals.  ? guaifenesin (HUMIBID E) 400 MG TABS tablet Take 1 tablet by mouth 3 (three) times daily as needed.  ? iron polysaccharides (NIFEREX) 150 MG capsule Take 1 capsule (150 mg total) by mouth daily.  ? lisinopril (PRINIVIL,ZESTRIL) 5 MG tablet Take 5 mg by mouth daily.  ? nitroGLYCERIN (NITROSTAT) 0.4 MG SL tablet Place 0.4 mg under the tongue every 5 (five) minutes as needed for chest pain.  ? predniSONE (DELTASONE) 20 MG tablet Take 20 mg by mouth in the morning and at bedtime. for elevated CRP4.2  ?  sodium chloride (OCEAN) 0.65 % nasal spray Place 1 spray into the nose 2 (two) times daily as needed for congestion.  ? Throat Lozenges (ZINC W/A&C) LOZG Use as directed in the mouth or throat. (vit a-vit c-zinc-propolis) lozenge; - ; oral ?Special Instructions: Give 5x/day x 2 weeks. ?5 Times Per Day  ? venlafaxine XR (EFFEXOR-XR) 150 MG 24 hr capsule Take 150 mg by mouth daily.  ? vitamin C (ASCORBIC ACID) 500 MG tablet Take 500 mg by mouth 2 (two) times daily.  ? vitamin E 180 MG (400 UNITS) capsule Take 1 tablet by mouth daily.  ? ziprasidone (GEODON) 20 MG capsule Take 20 mg by mouth daily.  ? ?No facility-administered encounter medications on file as of 05/08/2021.  ? ? ? ?SIGNIFICANT DIAGNOSTIC EXAMS ? ?PREVIOUS  ? ?04-18-21: chest x-ray: Pulmonary hypoinflation  ? ?04-19-21: ct of abdomen and pelvis:  ?No acute intra-abdominal pathology identified. No definite  radiographic explanation for the patient's reported symptoms. ?Trace bilateral pleural effusions. Mild coronary artery calcification. ?Cholelithiasis. ?Pancolonic diverticulosis without superimposed acute inflammatory change. ?Minimal urolithiasis with 7 mm dependently layering calculus with the bladder. ? ?TODAY ? ?04-29-21: chest x-ray: normal  ? ?LABS REVIEWED: PREVIOUS   ? ?04-18-21: wbc 1.5; hgb 1.47; hct 43.5; mcv 96.5 plt 94; glucose 109; bun 23; creat 1.15; k+ 4.0; na++ 139; ca 8.9; GFR>60; protein 6.7; albumin 3.6; blood culture: no growth; urine culture: <10,000.  ?04-20-21: wbc 29.9; hgb 11.7; hct 34.9; mcv 97.2 plt 58; glucose 69; bun 30; creat 0.99; k+ 4.3; na++ 135; ca 7.6; GFR >60; protein 5.7; albumin 2.7; ast 119; alt 57; mag 2.1  ?04-24-10: vit B 12: 2092; folate 10.6; iron 27; tibc 234; ferritin 185 ? ?TODAY ? ?04-30-21: wbc 5.3; hgb 13.9; hct 42.3; mcv 94.0 plt 244; glucose 95; bun 17; creat 0.67; k+ 3.8; na++ 136; ca 8.5; GFR>60; protein 6.9; albumin 3.2; mag 1.9 phos 3.3; d-dimer 2.99 CRP 4.2  ?05-07-21: d-dimer 11.05  ? ? ?Review of Systems  ?Constitutional:  Negative for malaise/fatigue.  ?Respiratory:  Negative for cough and shortness of breath.   ?Cardiovascular:  Negative for chest pain, palpitations and leg swelling.  ?Gastrointestinal:  Negative for abdominal pain, constipation and heartburn.  ?Musculoskeletal:  Negative for back pain, joint pain and myalgias.  ?Skin: Negative.   ?Neurological:  Negative for dizziness.  ?Psychiatric/Behavioral:  The patient is not nervous/anxious.   ? ?Physical Exam ?Constitutional:   ?   General: He is not in acute distress. ?   Appearance: He is well-developed. He is obese. He is not diaphoretic.  ?Neck:  ?   Thyroid: No thyromegaly.  ?Cardiovascular:  ?   Rate and Rhythm: Normal rate and regular rhythm.  ?   Pulses: Normal pulses.  ?   Heart sounds: Normal heart sounds.  ?Pulmonary:  ?   Effort: Pulmonary effort is normal. No respiratory distress.  ?   Breath  sounds: Normal breath sounds.  ?Abdominal:  ?   General: Bowel sounds are normal. There is no distension.  ?   Palpations: Abdomen is soft.  ?   Tenderness: There is no abdominal tenderness.  ?Musculoskeletal:     ?   General: Normal range of motion.  ?   Cervical back: Neck supple.  ?Lymphadenopathy:  ?   Cervical: No cervical adenopathy.  ?Skin: ?   General: Skin is warm and dry.  ?Neurological:  ?   Mental Status: He is alert. Mental status is at baseline.  ?Psychiatric:     ?  Mood and Affect: Mood normal.  ? ? ? ?ASSESSMENT/ PLAN: ? ?TODAY ? 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 ?For d-dimer: 11.05 will increase eliquis to 5 mg twice daily and will repeat d-dimer 05-14-21.  ? ? ?Synthia Innocent NP ?[facility]  ?call [phone]  ? ?

## 2021-05-11 ENCOUNTER — Other Ambulatory Visit: Payer: Self-pay | Admitting: Adult Health

## 2021-05-11 ENCOUNTER — Encounter (HOSPITAL_COMMUNITY)
Admission: RE | Admit: 2021-05-11 | Discharge: 2021-05-11 | Disposition: A | Discharge status: Home / Self Care | Payer: No Typology Code available for payment source | Source: Skilled Nursing Facility | Attending: Adult Health | Admitting: Adult Health

## 2021-05-11 ENCOUNTER — Non-Acute Institutional Stay (SKILLED_NURSING_FACILITY): Payer: No Typology Code available for payment source | Admitting: Adult Health

## 2021-05-11 ENCOUNTER — Encounter: Payer: Self-pay | Admitting: Adult Health

## 2021-05-11 DIAGNOSIS — I214 Non-ST elevation (NSTEMI) myocardial infarction: Secondary | ICD-10-CM

## 2021-05-11 DIAGNOSIS — F319 Bipolar disorder, unspecified: Secondary | ICD-10-CM

## 2021-05-11 DIAGNOSIS — U071 COVID-19: Secondary | ICD-10-CM | POA: Insufficient documentation

## 2021-05-11 DIAGNOSIS — I4892 Unspecified atrial flutter: Secondary | ICD-10-CM

## 2021-05-11 DIAGNOSIS — E46 Unspecified protein-calorie malnutrition: Secondary | ICD-10-CM

## 2021-05-11 DIAGNOSIS — E8809 Other disorders of plasma-protein metabolism, not elsewhere classified: Secondary | ICD-10-CM

## 2021-05-11 LAB — D-DIMER, QUANTITATIVE: D-Dimer, Quant: 1.53 ug/mL-FEU — ABNORMAL HIGH (ref 0.00–0.50)

## 2021-05-11 MED ORDER — VENLAFAXINE HCL ER 150 MG PO CP24: 150.0000 mg | ORAL_CAPSULE | Freq: Every day | ORAL | 0 refills | Status: DC

## 2021-05-11 MED ORDER — ZIPRASIDONE HCL 20 MG PO CAPS: 20.0000 mg | ORAL_CAPSULE | Freq: Every day | ORAL | 0 refills | Status: DC

## 2021-05-11 MED ORDER — LISINOPRIL 5 MG PO TABS: 5.0000 mg | ORAL_TABLET | Freq: Every day | ORAL | 0 refills | Status: DC

## 2021-05-11 MED ORDER — POLYSACCHARIDE IRON COMPLEX 150 MG PO CAPS: 150.0000 mg | ORAL_CAPSULE | Freq: Every day | ORAL | 0 refills | Status: DC

## 2021-05-11 MED ORDER — NITROGLYCERIN 0.4 MG SL SUBL: 0.4000 mg | SUBLINGUAL_TABLET | SUBLINGUAL | 0 refills | Status: DC | PRN

## 2021-05-11 MED ORDER — APIXABAN 2.5 MG PO TABS: 2.5000 mg | ORAL_TABLET | Freq: Two times a day (BID) | ORAL | 0 refills | Status: DC

## 2021-05-11 NOTE — Progress Notes (Signed)
? ?Location:  [facility] ?Nursing Home Room Number: 133-P ?Place of Service:  SNF (31) ? ?Provider: Ok Edwards np  ? ?PCP: [facility] ?Patient Care Team: ?[facility] as PCP - General ? ?Extended Emergency Contact Information ?Primary Emergency Contact: Carter,Tonja ?         [place], [place] [address] [place] ?Home Phone: [phone] ?Relation: Daughter ? ?Code Status: full  ?Goals of care:  Advanced Directive information ? ?  05/11/2021  ?  8:40 AM  ?Advanced Directives  ?Does Patient Have a Medical Advance Directive? No  ?Would patient like information on creating a medical advance directive? No - Patient declined  ? ? ? ?No Known Allergies ? ?Chief Complaint  ?Patient presents with  ? Discharge Note  ? ? ?HPI:  ?76 y.o. male  being discharged to home with home health for pt/ot. He will not need any dme. Will need his prescriptions and follow up visit with his medical provider. He had been hospitalized for sepsis due to UTI. He did get covid while in this facility. He currently taking eliquis for his elevated d-dimer. He has participated in pt/ot to improve upon his ability to be independent with his adl care. He is now ready for discharge.  ? ? ? ?Past Medical History:  ?Diagnosis Date  ? Bipolar disorder ([place])   ? Coronary artery disease   ? Depression   ? Hypertension   ? ? ?Past Surgical History:  ?Procedure Laterality Date  ? KNEE SURGERY    ? with replacement on right knee  ? ? ?  reports that he has never smoked. He has never used smokeless tobacco. He reports current alcohol use. He reports that he does not use drugs. ?Social History  ? ?Socioeconomic History  ? Marital status: Divorced  ?  Spouse name: Not on file  ? Number of children: Not on file  ? Years of education: Not on file  ? Highest education level: Not on file  ?Occupational History  ? Not on file  ?Tobacco Use  ? Smoking status: Never  ? Smokeless tobacco: Never  ?Vaping Use  ? Vaping Use:  Never used  ?Substance and Sexual Activity  ? Alcohol use: Yes  ?  Comment: occasional  ? Drug use: No  ? Sexual activity: Not on file  ?Other Topics Concern  ? Not on file  ?Social History Narrative  ? Not on file  ? ?Social Determinants of Health  ? ?Financial Resource Strain: Not on file  ?Food Insecurity: Not on file  ?Transportation Needs: Not on file  ?Physical Activity: Not on file  ?Stress: Not on file  ?Social Connections: Not on file  ?Intimate Partner Violence: Not on file  ? ? ?  ? ?No Known Allergies ? ?Pertinent  Health Maintenance Due  ?Topic Date Due  ? COLONOSCOPY (Pts 45-57yrs Insurance coverage will need to be confirmed)  Never done  ? INFLUENZA VACCINE  08/28/2021  ? ? ?Medications: ?Outpatient Encounter Medications as of 05/11/2021  ?Medication Sig  ? apixaban (ELIQUIS) 2.5 MG TABS tablet Take 2.5 mg by mouth 2 (two) times daily. for d-dimer 2.99  ? aspirin 81 MG chewable tablet aspirin 81 mg chewable tablet ? Chew 1 tablet every day by oral route.  ? bacitracin ointment Apply topically 2 (two) times daily. (Patient taking differently: Apply topically 2 (two) times daily. Apply topically to urethral meatus.  ?Apply to sores of nose and corner of mouth.)  ?  benzonatate (TESSALON) 200 MG capsule Take 200 mg by mouth every 8 (eight) hours as needed for cough. Congestion  ? Ergocalciferol (VITAMIN D2 PO) Take by mouth. 1,250 mcg (50,000 unit); oral Once A Day Every 7 Days ?Special Instructions: Give once a week x 2 doses.  ? guaifenesin (HUMIBID E) 400 MG TABS tablet Take 1 tablet by mouth 3 (three) times daily as needed.  ? iron polysaccharides (NIFEREX) 150 MG capsule Take 1 capsule (150 mg total) by mouth daily.  ? lisinopril (PRINIVIL,ZESTRIL) 5 MG tablet Take 5 mg by mouth daily.  ? nitroGLYCERIN (NITROSTAT) 0.4 MG SL tablet Place 0.4 mg under the tongue every 5 (five) minutes as needed for chest pain.  ? sodium chloride (OCEAN) 0.65 % nasal spray Place 1 spray into the nose 2 (two) times daily  as needed for congestion.  ? Throat Lozenges (ZINC W/A&C) LOZG Use as directed in the mouth or throat. (vit a-vit c-zinc-propolis) lozenge; - ; oral ?Special Instructions: Give 5x/day x 2 weeks. ?5 Times Per Day  ? venlafaxine XR (EFFEXOR-XR) 150 MG 24 hr capsule Take 150 mg by mouth daily.  ? vitamin C (ASCORBIC ACID) 500 MG tablet Take 500 mg by mouth 2 (two) times daily.  ? vitamin E 180 MG (400 UNITS) capsule Take 1 tablet by mouth daily.  ? ziprasidone (GEODON) 20 MG capsule Take 20 mg by mouth daily.  ? feeding supplement (ENSURE ENLIVE / ENSURE PLUS) LIQD Take 237 mLs by mouth 2 (two) times daily between meals.  ? [DISCONTINUED] azithromycin (ZITHROMAX) 500 MG tablet Take 500 mg by mouth daily.  ? [DISCONTINUED] predniSONE (DELTASONE) 20 MG tablet Take 20 mg by mouth in the morning and at bedtime. for elevated CRP4.2  ? ?No facility-administered encounter medications on file as of 05/11/2021.  ? ? ? ?Vitals:  ? 05/11/21 0831  ?BP: 134/67  ?Pulse: 66  ?Resp: 18  ?Temp: (!) 97.5 ?F (36.4 ?C)  ?SpO2: 94%  ?Weight: 248 lb 11.2 oz (112.8 kg)  ?Height: 6' (1.829 m)  ? ?Body mass index is 33.73 kg/m?. ? ? ? ?PREVIOUS  ? ?04-18-21: chest x-ray: Pulmonary hypoinflation  ? ?04-19-21: ct of abdomen and pelvis:  ?No acute intra-abdominal pathology identified. No definite radiographic explanation for the patient's reported symptoms. ?Trace bilateral pleural effusions. Mild coronary artery calcification. ?Cholelithiasis. ?Pancolonic diverticulosis without superimposed acute inflammatory change. ?Minimal urolithiasis with 7 mm dependently layering calculus with the bladder. ? ?04-29-21: chest x-ray: normal  ? ?NO NEW EXAMS.  ? ?LABS REVIEWED: PREVIOUS   ? ?04-18-21: wbc 1.5; hgb 1.47; hct 43.5; mcv 96.5 plt 94; glucose 109; bun 23; creat 1.15; k+ 4.0; na++ 139; ca 8.9; GFR>60; protein 6.7; albumin 3.6; blood culture: no growth; urine culture: <10,000.  ?04-20-21: wbc 29.9; hgb 11.7; hct 34.9; mcv 97.2 plt 58; glucose 69; bun 30;  creat 0.99; k+ 4.3; na++ 135; ca 7.6; GFR >60; protein 5.7; albumin 2.7; ast 119; alt 57; mag 2.1  ?04-24-10: vit B 12: 2092; folate 10.6; iron 27; tibc 234; ferritin 185 ?04-30-21: wbc 5.3; hgb 13.9; hct 42.3; mcv 94.0 plt 244; glucose 95; bun 17; creat 0.67; k+ 3.8; na++ 136; ca 8.5; GFR>60; protein 6.9; albumin 3.2; mag 1.9 phos 3.3; d-dimer 2.99 CRP 4.2  ?05-07-21: d-dimer 11.05  ? ?NO NEW LABS.  ? ?Review of Systems  ?Constitutional:  Negative for malaise/fatigue.  ?Respiratory:  Negative for cough and shortness of breath.   ?Cardiovascular:  Negative for chest pain, palpitations and leg swelling.  ?  Gastrointestinal:  Negative for abdominal pain, constipation and heartburn.  ?Musculoskeletal:  Negative for back pain, joint pain and myalgias.  ?Skin: Negative.   ?Neurological:  Negative for dizziness.  ?Psychiatric/Behavioral:  The patient is not nervous/anxious.   ? ?Physical Exam ?Constitutional:   ?   General: He is not in acute distress. ?   Appearance: He is well-developed. He is obese. He is not diaphoretic.  ?Neck:  ?   Thyroid: No thyromegaly.  ?Cardiovascular:  ?   Rate and Rhythm: Normal rate and regular rhythm.  ?   Heart sounds: Normal heart sounds.  ?Pulmonary:  ?   Effort: Pulmonary effort is normal. No respiratory distress.  ?   Breath sounds: Normal breath sounds.  ?Abdominal:  ?   General: Bowel sounds are normal. There is no distension.  ?   Palpations: Abdomen is soft.  ?   Tenderness: There is no abdominal tenderness.  ?Musculoskeletal:     ?   General: Normal range of motion.  ?   Cervical back: Neck supple.  ?   Right lower leg: No edema.  ?   Left lower leg: No edema.  ?Lymphadenopathy:  ?   Cervical: No cervical adenopathy.  ?Skin: ?   General: Skin is warm and dry.  ?Neurological:  ?   Mental Status: He is alert and oriented to person, place, and time.  ?Psychiatric:     ?   Mood and Affect: Mood normal.  ? ? ? ?Assessment/Plan:   ? ?Patient is being discharged with the following home health  services:  pt/ot to evaluate and treat as indicated for gait balance strength adl training.  ? ?Patient is being discharged with the following durable medical equipment:  none needed  ? ?Patient has been

## 2021-05-14 ENCOUNTER — Other Ambulatory Visit: Payer: Self-pay | Admitting: Adult Health

## 2021-06-05 ENCOUNTER — Other Ambulatory Visit: Payer: Self-pay

## 2021-06-05 ENCOUNTER — Emergency Department (HOSPITAL_COMMUNITY)
Admission: EM | Admit: 2021-06-05 | Discharge: 2021-06-05 | Disposition: A | Discharge status: Home / Self Care | Payer: No Typology Code available for payment source | Attending: Emergency Medicine | Admitting: Emergency Medicine

## 2021-06-05 ENCOUNTER — Emergency Department (HOSPITAL_COMMUNITY): Payer: No Typology Code available for payment source

## 2021-06-05 ENCOUNTER — Encounter (HOSPITAL_COMMUNITY): Payer: Self-pay

## 2021-06-05 DIAGNOSIS — Z7982 Long term (current) use of aspirin: Secondary | ICD-10-CM | POA: Diagnosis not present

## 2021-06-05 DIAGNOSIS — I25118 Atherosclerotic heart disease of native coronary artery with other forms of angina pectoris: Secondary | ICD-10-CM | POA: Diagnosis not present

## 2021-06-05 DIAGNOSIS — I10 Essential (primary) hypertension: Secondary | ICD-10-CM | POA: Diagnosis not present

## 2021-06-05 DIAGNOSIS — I4892 Unspecified atrial flutter: Secondary | ICD-10-CM | POA: Insufficient documentation

## 2021-06-05 DIAGNOSIS — R079 Chest pain, unspecified: Secondary | ICD-10-CM | POA: Diagnosis present

## 2021-06-05 DIAGNOSIS — Z79899 Other long term (current) drug therapy: Secondary | ICD-10-CM | POA: Insufficient documentation

## 2021-06-05 DIAGNOSIS — R072 Precordial pain: Secondary | ICD-10-CM

## 2021-06-05 LAB — BASIC METABOLIC PANEL
Anion gap: 9 (ref 5–15)
BUN: 22 mg/dL (ref 8–23)
CO2: 25 mmol/L (ref 22–32)
Calcium: 8.8 mg/dL — ABNORMAL LOW (ref 8.9–10.3)
Chloride: 107 mmol/L (ref 98–111)
Creatinine, Ser: 0.83 mg/dL (ref 0.61–1.24)
GFR, Estimated: >60 mL/min (ref >60)
Glucose, Bld: 112 mg/dL — ABNORMAL HIGH (ref 70–99)
Potassium: 4.5 mmol/L (ref 3.5–5.1)
Sodium: 141 mmol/L (ref 135–145)

## 2021-06-05 LAB — CBC
HCT: 41.7 % (ref 39.0–52.0)
Hemoglobin: 14 g/dL (ref 13.0–17.0)
MCH: 31.7 pg (ref 26.0–34.0)
MCHC: 33.6 g/dL (ref 30.0–36.0)
MCV: 94.6 fL (ref 80.0–100.0)
Platelets: 163 10*3/uL (ref 150–400)
RBC: 4.41 MIL/uL (ref 4.22–5.81)
RDW: 13.2 % (ref 11.5–15.5)
WBC: 8.9 10*3/uL (ref 4.0–10.5)
nRBC: 0 % (ref 0.0–0.2)

## 2021-06-05 LAB — TROPONIN I (HIGH SENSITIVITY)
Troponin I (High Sensitivity): 16 ng/L (ref <18)
Troponin I (High Sensitivity): 15 ng/L (ref <18)

## 2021-06-05 MED ORDER — ASPIRIN 81 MG PO CHEW
324.0000 mg | CHEWABLE_TABLET | Freq: Once | ORAL | Status: AC
  Administered 2021-06-05: 324 mg via ORAL
  Filled 2021-06-05: qty 4

## 2021-06-05 MED ORDER — NITROGLYCERIN 0.4 MG SL SUBL: 0.4000 mg | SUBLINGUAL_TABLET | SUBLINGUAL | Status: DC | PRN

## 2021-06-05 NOTE — Discharge Instructions (Signed)

## 2021-06-05 NOTE — ED Triage Notes (Signed)
Pt arrived from home pov with complaints of chest pain that started around 445am. Says that pain was throbbing and located in the center and radiated towards the left side. Chest pain is now a 5/10 and has reduced since leaving home.  ?

## 2021-06-05 NOTE — ED Provider Notes (Signed)
?[facility] ?Provider Note ? ? ?CSN: [id number] ?Arrival date & time: 06/05/21  0515 ? ?  ? ?History ? ?Chief Complaint  ?Patient presents with  ? Chest Pain  ? ? ?Mario Massey is a 76 y.o. male. ? ?The history is provided by the patient.  ?Chest Pain ?Pain location:  L chest ?Pain quality: throbbing   ?Pain radiates to:  Does not radiate ?Pain severity:  Moderate ?Onset quality:  Gradual ?Duration:  1 hour ?Timing:  Constant ?Progression:  Improving ?Chronicity:  New ?Relieved by:  Nothing ?Worsened by:  Nothing ?Associated symptoms: no cough, no fever and no shortness of breath   ?Risk factors: coronary artery disease, hypertension and male sex   ?Patient with history of CAD, bipolar presents with chest pain.  He reports he woke up and was having left-sided chest pain.  He reports it feels like previous episodes.  Since arriving in the ER and is now improving.  He had otherwise been at his baseline. ?  ? ?Home Medications ?Prior to Admission medications   ?Medication Sig Start Date End Date Taking? Authorizing Provider  ?aspirin 81 MG chewable tablet aspirin 81 mg chewable tablet ? Chew 1 tablet every day by oral route.    [provider]  ?bacitracin ointment Apply topically 2 (two) times daily. ?Patient taking differently: Apply topically 2 (two) times daily. Apply topically to urethral meatus.  ?Apply to sores of nose and corner of mouth. 04/23/21   Raiford Noble Latif, DO  ?benzonatate (TESSALON) 200 MG capsule Take 200 mg by mouth every 8 (eight) hours as needed for cough. Congestion    [provider]  ?Ergocalciferol (VITAMIN D2 PO) Take by mouth. 1,250 mcg (50,000 unit); oral Once A Day Every 7 Days ?Special Instructions: Give once a week x 2 doses.    [provider]  ?feeding supplement (ENSURE ENLIVE / ENSURE PLUS) LIQD Take 237 mLs by mouth 2 (two) times daily between meals. 04/23/21   Raiford Noble Latif, DO  ?guaifenesin (HUMIBID E) 400 MG TABS tablet  Take 1 tablet by mouth 3 (three) times daily as needed. 11/06/20   [provider]  ?iron polysaccharides (NIFEREX) 150 MG capsule Take 1 capsule (150 mg total) by mouth daily. 05/11/21   Gerlene Fee, NP  ?lisinopril (ZESTRIL) 5 MG tablet Take 1 tablet (5 mg total) by mouth daily. 05/11/21   Gerlene Fee, NP  ?nitroGLYCERIN (NITROSTAT) 0.4 MG SL tablet Place 1 tablet (0.4 mg total) under the tongue every 5 (five) minutes as needed for chest pain. 05/11/21   Gerlene Fee, NP  ?sodium chloride (OCEAN) 0.65 % nasal spray Place 1 spray into the nose 2 (two) times daily as needed for congestion.    [provider]  ?venlafaxine XR (EFFEXOR-XR) 150 MG 24 hr capsule Take 1 capsule (150 mg total) by mouth daily. 05/11/21   Gerlene Fee, NP  ?vitamin E 180 MG (400 UNITS) capsule Take 1 tablet by mouth daily. 03/19/05   [provider]  ?ziprasidone (GEODON) 20 MG capsule Take 1 capsule (20 mg total) by mouth daily. 05/11/21   Gerlene Fee, NP  ?   ? ?Allergies    ?Patient has no known allergies.   ? ?Review of Systems   ?Review of Systems  ?Constitutional:  Negative for fever.  ?Respiratory:  Negative for cough and shortness of breath.   ?Cardiovascular:  Positive for chest pain.  ? ?Physical Exam ?Updated Vital Signs ?BP  139/63   Pulse 60   Temp 98.3 ?F (36.8 ?C)   Resp 18   Ht 1.829 m (6')   Wt 110.2 kg   SpO2 97%   BMI 32.96 kg/m?  ?Physical Exam ?CONSTITUTIONAL: Elderly, no acute distress ?HEAD: Normocephalic/atraumatic ?EYES: EOMI/PERRL ?ENMT: Mucous membranes moist ?NECK: supple no meningeal signs ?SPINE/BACK:entire spine nontender ?CV: S1/S2 noted, no murmurs/rubs/gallops noted ?LUNGS: Lungs are clear to auscultation bilaterally, no apparent distress ?ABDOMEN: soft, nontender, obese ?NEURO: Pt is awake/alert/appropriate, moves all extremitiesx4.  No facial droop.  Bilateral upper extremity tremor noted ?EXTREMITIES: pulses normal/equalx4, full ROM, no lower extremity  edema ?SKIN: warm, color normal ?PSYCH: Flat affect ? ?ED Results / Procedures / Treatments   ?Labs ?(all labs ordered are listed, but only abnormal results are displayed) ?Labs Reviewed  ?BASIC METABOLIC PANEL - Abnormal; Notable for the following components:  ?    Result Value  ? Glucose, Bld 112 (*)   ? Calcium 8.8 (*)   ? All other components within normal limits  ?CBC  ?TROPONIN I (HIGH SENSITIVITY)  ?TROPONIN I (HIGH SENSITIVITY)  ? ? ?EKG ?EKG Interpretation ? ?Date/Time:  Tuesday Jun 05 2021 05:34:57 EDT ?Ventricular Rate:  63 ?PR Interval:  243 ?QRS Duration: 219 ?QT Interval:  458 ?QTC Calculation: 469 ?R Axis:   -34 ?Text Interpretation: rhythm indeterminate Prolonged PR interval Consider left atrial enlargement Right bundle branch block Left ventricular hypertrophy Interpretation limited secondary to artifact Confirmed by Ripley Fraise [phone]) on 06/05/2021 5:40:08 AM ? ?Radiology ?DG Chest Portable 1 View ? ?Result Date: 06/05/2021 ?CLINICAL DATA:  Chest pain beginning this morning EXAM: PORTABLE CHEST 1 VIEW COMPARISON:  04/18/2021 FINDINGS: Cardiac enlargement, accentuated by low volumes. Stable mediastinal contours. There is no edema, consolidation, effusion, or pneumothorax. 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 IMPRESSION: Stable from prior.  No acute finding. Electronically Signed   By: Jorje Guild M.D.   On: 06/05/2021 05:49   ? ?Procedures ?Procedures  ? ? ?Medications Ordered in ED ?Medications  ?nitroGLYCERIN (NITROSTAT) SL tablet 0.4 mg (has no administration in time range)  ?aspirin chewable tablet 324 mg (324 mg Oral Given 06/05/21 0539)  ? ? ?ED Course/ Medical Decision Making/ A&P ?Clinical Course as of 06/05/21 0706  ?Tue Jun 05, 2021  ?0541 Patient is a poor historian.  It appears he began having chest pain earlier this morning.  He is in no acute distress.  EKG reviewed but is limited as there is significant artifact due to his tremor.  There are no acute ST  changes.  Labs and imaging pending at this time [DW]  ?[phone] Patient reports all of his symptoms are improved.  He is in no acute distress.  Initial troponin is negative.  He is hesitant to be admitted to the hospital.  Initial plan be to monitor in the ED, recheck her troponin and reassess [DW]  ?0642 Review of chart, patient was admitted recently for sepsis and had a non-STEMI.  It was felt that his troponin elevation was due to his underlying sepsis.  Patient also had episodes of bradycardia and did have episodes of atrial flutter per cardiology.  He was never an anticoagulation candidate due to his underlying health conditions and thrombocytopenia.  Here in the ED this morning [DW]  ?0643 He has some evidence of atrial flutter, but difficult to determine due to artifact and his tremor [DW]  ?0705 Signed out to Dr Vanita Panda at shift change.   ? [DW]  ?  ?Clinical  Course User Index ?[DW] Ripley Fraise, MD  ? ?                        ?Medical Decision Making ?Amount and/or Complexity of Data Reviewed ?Labs: ordered. ?Radiology: ordered. ?ECG/medicine tests: ordered. ? ?Risk ?OTC drugs. ?Prescription drug management. ? ? ?This patient presents to the ED for concern of chest pain, this involves an extensive number of treatment options, and is a complaint that carries with it a high risk of complications and morbidity.  The differential diagnosis includes but is not limited to acute coronary syndrome, pulmonary embolism, aortic dissection, pneumonia, pneumothorax, pericarditis ? ?Comorbidities that complicate the patient evaluation: ?Patient?s presentation is complicated by their history of CAD ? ?Social Determinants of Health: ?Patient is a poor historian increases the complexity of managing their presentation ? ?Additional history obtained: ?Records reviewed previous admission documents ? ?Lab Tests: ?I Ordered, and personally interpreted labs.  The pertinent results include: Labs overall reassuring and  negative ? ?Imaging Studies ordered: ?I ordered imaging studies including X-ray chest   ?I independently visualized and interpreted imaging which showed no acute finding ?I agree with the radiologist interpretation ? ?Cardiac Mo

## 2021-06-21 ENCOUNTER — Other Ambulatory Visit (HOSPITAL_COMMUNITY): Payer: Self-pay | Admitting: Gerontology

## 2021-06-21 DIAGNOSIS — M25511 Pain in right shoulder: Secondary | ICD-10-CM

## 2021-06-21 DIAGNOSIS — M25512 Pain in left shoulder: Secondary | ICD-10-CM

## 2021-06-27 ENCOUNTER — Ambulatory Visit (HOSPITAL_COMMUNITY)
Admission: RE | Admit: 2021-06-27 | Discharge: 2021-06-27 | Disposition: A | Discharge status: Home / Self Care | Payer: No Typology Code available for payment source | Source: Ambulatory Visit | Attending: Gerontology | Admitting: Gerontology

## 2021-06-27 DIAGNOSIS — M25511 Pain in right shoulder: Secondary | ICD-10-CM | POA: Insufficient documentation

## 2021-06-27 DIAGNOSIS — M25512 Pain in left shoulder: Secondary | ICD-10-CM | POA: Insufficient documentation

## 2021-06-29 ENCOUNTER — Encounter: Payer: Self-pay | Admitting: Urology

## 2021-06-29 ENCOUNTER — Ambulatory Visit (INDEPENDENT_AMBULATORY_CARE_PROVIDER_SITE_OTHER): Payer: No Typology Code available for payment source | Admitting: Urology

## 2021-06-29 VITALS — BP 137/76 | HR 67 | Ht 72.0 in | Wt 236.0 lb

## 2021-06-29 DIAGNOSIS — Z87440 Personal history of urinary (tract) infections: Secondary | ICD-10-CM | POA: Diagnosis not present

## 2021-06-29 DIAGNOSIS — R339 Retention of urine, unspecified: Secondary | ICD-10-CM

## 2021-06-29 DIAGNOSIS — Z978 Presence of other specified devices: Secondary | ICD-10-CM

## 2021-06-29 NOTE — Progress Notes (Signed)
Assessment: 1. Chronic indwelling Foley catheter   2. Urinary retention   3. History of UTI    Plan: Request urology notes from the [place] clinic in [place] regarding his prior evaluation. Continue Foley catheter pending review of records. He may need further evaluation with cystoscopy and possible urodynamics. Return to office in 2 weeks for catheter change.  Chief Complaint:  Chief Complaint  Patient presents with   Urinary Retention    History of Present Illness:  Mario Massey is a 76 y.o. year old male who is seen in consultation from Clinic, [place] for evaluation of urinary retention and chronic Foley catheter.  He apparently has had a Foley catheter since March 2021.  He was previously followed by urology at the [facility] clinic in [place].  No records available for review.  Patient is unable to give any history of prior urologic evaluation with cystoscopy or urodynamics.  He does not think he has had a prior voiding trial.  He has been undergoing catheter changes monthly.  His catheter was last changed by home health care on May 15.  He no longer has home health care services. He has had intermittent problems with catheter associated UTIs.  He was apparently admitted for sepsis secondary to a UTI in March 2023.  No recent fevers, chills, or gross hematuria.  Urine culture results: 7/22 >100 K Enterococcus, Pseudomonas 8/22 >100 K Pseudomonas 10/22 >100 K E. Coli 3/23 <10K colonies  CT abdomen and pelvis from 3/23 demonstrated normal kidneys, Foley catheter in decompressed bladder and a 8 mm calculus within the bladder. Renal ultrasound from 3/23 showed no renal mass or hydronephrosis and a decompressed bladder.   Past Medical History:  Past Medical History:  Diagnosis Date   Bipolar disorder ([place])    Coronary artery disease    Depression    Hypertension     Past Surgical History:  Past Surgical History:  Procedure Laterality Date   KNEE SURGERY      with replacement on right knee    Allergies:  No Known Allergies  Family History:  No family history on file.  Social History:  Social History   Tobacco Use   Smoking status: Never   Smokeless tobacco: Never  Vaping Use   Vaping Use: Never used  Substance Use Topics   Alcohol use: Yes    Comment: occasional   Drug use: No    Review of symptoms:  Constitutional:  Negative for unexplained weight loss, night sweats, fever, chills ENT:  Negative for nose bleeds, sinus pain, painful swallowing CV:  Negative for chest pain, shortness of breath, exercise intolerance, palpitations, loss of consciousness Resp:  Negative for cough, wheezing, shortness of breath GI:  Negative for nausea, vomiting, diarrhea, bloody stools GU:  Positives noted in HPI; otherwise negative for gross hematuria, dysuria, urinary incontinence Neuro:  Negative for seizures, poor balance, limb weakness, slurred speech Psych:  Negative for lack of energy, depression, anxiety Endocrine:  Negative for polydipsia, polyuria, symptoms of hypoglycemia (dizziness, hunger, sweating) Hematologic:  Negative for anemia, purpura, petechia, prolonged or excessive bleeding, use of anticoagulants  Allergic:  Negative for difficulty breathing or choking as a result of exposure to anything; no shellfish allergy; no allergic response (rash/itch) to materials, foods  Physical exam: BP 137/76   Pulse 67   Ht 6' (1.829 m)   Wt 236 lb (107 kg)   BMI 32.01 kg/m  GENERAL APPEARANCE:  Well appearing, well developed, well nourished, NAD HEENT: Atraumatic, Normocephalic,  oropharynx clear. NECK: Supple without lymphadenopathy or thyromegaly. LUNGS: Clear to auscultation bilaterally. HEART: Regular Rate and Rhythm without murmurs, gallops, or rubs. ABDOMEN: Soft, non-tender, No Masses. EXTREMITIES: Moves all extremities well.  Without clubbing, cyanosis, or edema. NEUROLOGIC:  Alert and oriented x 3, normal gait, CN II-XII grossly  intact.  MENTAL STATUS:  Appropriate. BACK:  Non-tender to palpation.  No CVAT SKIN:  Warm, dry and intact.   GU: Penis:  uncircumcised Meatus: Foley in place draining clear urine; no erosion noted Scrotum: normal, no masses Testis: normal without masses bilateral  Results: None

## 2021-07-12 ENCOUNTER — Ambulatory Visit (INDEPENDENT_AMBULATORY_CARE_PROVIDER_SITE_OTHER): Payer: Self-pay | Admitting: Internal Medicine

## 2021-07-12 ENCOUNTER — Other Ambulatory Visit (HOSPITAL_COMMUNITY)
Admission: RE | Admit: 2021-07-12 | Discharge: 2021-07-12 | Disposition: A | Discharge status: Home / Self Care | Payer: No Typology Code available for payment source | Source: Ambulatory Visit | Attending: Internal Medicine | Admitting: Internal Medicine

## 2021-07-12 ENCOUNTER — Encounter: Payer: Self-pay | Admitting: Internal Medicine

## 2021-07-12 VITALS — BP 136/74 | HR 64 | Ht 72.0 in | Wt 240.0 lb

## 2021-07-12 DIAGNOSIS — I25119 Atherosclerotic heart disease of native coronary artery with unspecified angina pectoris: Secondary | ICD-10-CM

## 2021-07-12 DIAGNOSIS — I10 Essential (primary) hypertension: Secondary | ICD-10-CM

## 2021-07-12 DIAGNOSIS — E782 Mixed hyperlipidemia: Secondary | ICD-10-CM

## 2021-07-12 DIAGNOSIS — I214 Non-ST elevation (NSTEMI) myocardial infarction: Secondary | ICD-10-CM | POA: Insufficient documentation

## 2021-07-12 LAB — CBC
HCT: 40.4 % (ref 39.0–52.0)
Hemoglobin: 13.3 g/dL (ref 13.0–17.0)
MCH: 31.4 pg (ref 26.0–34.0)
MCHC: 32.9 g/dL (ref 30.0–36.0)
MCV: 95.5 fL (ref 80.0–100.0)
Platelets: 170 10*3/uL (ref 150–400)
RBC: 4.23 MIL/uL (ref 4.22–5.81)
RDW: 13.2 % (ref 11.5–15.5)
WBC: 6.8 10*3/uL (ref 4.0–10.5)
nRBC: 0 % (ref 0.0–0.2)

## 2021-07-12 LAB — BASIC METABOLIC PANEL
Anion gap: 5 (ref 5–15)
BUN: 21 mg/dL (ref 8–23)
CO2: 25 mmol/L (ref 22–32)
Calcium: 9.2 mg/dL (ref 8.9–10.3)
Chloride: 110 mmol/L (ref 98–111)
Creatinine, Ser: 0.95 mg/dL (ref 0.61–1.24)
GFR, Estimated: >60 mL/min (ref >60)
Glucose, Bld: 97 mg/dL (ref 70–99)
Potassium: 4.9 mmol/L (ref 3.5–5.1)
Sodium: 140 mmol/L (ref 135–145)

## 2021-07-12 NOTE — Patient Instructions (Signed)
Medication Instructions:  Your physician recommends that you continue on your current medications as directed. Please refer to the Current Medication list given to you today.   Labwork: CBC BMET   Testing/Procedures: None  Follow-Up: Follow up with Dr. Rennis Golden in 3 months.   Any Other Special Instructions Will Be Listed Below (If Applicable).     If you need a refill on your cardiac medications before your next appointment, please call your pharmacy.   [facility] [address] [place] [address] Dept: [phone] Loc: [phone]  Elmore Hyslop  07/12/2021  You are scheduled for a Cardiac Catheterization on Wednesday, June 21 with Dr. Tonny Bollman.  1. Please arrive at the Main Entrance A at [facility]: [address] [place], [place] [address] at 9:30 AM (This time is two hours before your procedure to ensure your preparation). Free valet parking service is available.   Special note: Every effort is made to have your procedure done on time. Please understand that emergencies sometimes delay scheduled procedures.  2. Diet: Do not eat solid foods after midnight.  You may have clear liquids until 5 AM upon the day of the procedure.  3. Labs: You will do not need new lab work drawn.  4. Medication instructions in preparation for your procedure:   Contrast Allergy: No   On the morning of your procedure, take Aspirin 81 mg tablet and any morning medicines NOT listed above.  You may use sips of water.  5. Plan to go home the same day, you will only stay overnight if medically necessary. 6. You MUST have a responsible adult to drive you home. 7. An adult MUST be with you the first 24 hours after you arrive home. 8. Bring a current list of your medications, and the last time and date medication taken. 9. Bring ID and current insurance cards. 10.Please wear clothes that are easy to get  on and off and wear slip-on shoes.  Thank you for allowing [place] to care for you!   -- [facility]

## 2021-07-13 ENCOUNTER — Ambulatory Visit (INDEPENDENT_AMBULATORY_CARE_PROVIDER_SITE_OTHER): Payer: No Typology Code available for payment source | Admitting: Physician Assistant

## 2021-07-13 DIAGNOSIS — R339 Retention of urine, unspecified: Secondary | ICD-10-CM

## 2021-07-13 NOTE — Progress Notes (Signed)
Cath Change/ Replacement  Patient is present today for a catheter change due to urinary retention.  53ml of water was removed from the balloon, a 18FR foley cath was removed with out difficulty.  Patient was cleaned and prepped in a sterile fashion with betadine. A 18 FR foley cath was replaced into the bladder no complications were noted Urine return was noted 38ml and urine was yellow in color. The balloon was filled with 40ml of sterile water. A leg bag was attached for drainage.  A night bag was also given to the patient and patient was given instruction on how to change from one bag to another. Patient was given proper instruction on catheter care.    Performed by: Guss Bunde, CMA  Follow up: Follow up with MD in 1 month with catheter change

## 2021-07-15 NOTE — Progress Notes (Signed)
OFFICE NOTE  Chief Complaint:  Hospital follow-up  Primary Care Physician: [facility]  HPI:  Mario Massey is a 76 y.o. male with a past medial history significant for probable sepsis in May 2023, who I saw in the hospital due to elevated troponin concerning for NSTEMI.  Past medical history includes coronary artery disease per chart, hypertension, dyslipidemia and bipolar disorder.  He was admitted to [facility] for worsening nausea vomiting and hematuria.  He was noted to be in possible atrial flutter with a new right bundle branch block upon admission, but is also had some Wenckebach.  Troponin was initially mild elevated at 110 but rose up to 2362.  Is not felt to be a catheterization candidate at that time during the hospitalization and medical therapy was recommended.  Subsequently was discharged.  He was seen in the emergency department again in May 2023 for chest pain.  The work-up at that time was negative.  He was advised to follow-up with cardiology because he had not yet followed up with [place] from his original hospitalization.  PMHx:  Past Medical History:  Diagnosis Date   Bipolar disorder (HCC)    Coronary artery disease    Depression    Hypertension     Past Surgical History:  Procedure Laterality Date   KNEE SURGERY     with replacement on right knee    FAMHx:  History reviewed. No pertinent family history.  SOCHx:   reports that he has never smoked. He has never used smokeless tobacco. He reports current alcohol use. He reports that he does not use drugs.  ALLERGIES:  No Known Allergies  ROS: Pertinent items noted in HPI and remainder of comprehensive ROS otherwise negative.  HOME MEDS: Current Outpatient Medications on File Prior to Visit  Medication Sig Dispense Refill   bacitracin ointment Apply topically 2 (two) times daily. (Patient taking differently: Apply topically 2 (two) times daily. Apply topically to urethral meatus.  Apply to  sores of nose and corner of mouth.) 120 g 0   lisinopril (ZESTRIL) 5 MG tablet Take 1 tablet (5 mg total) by mouth daily. 30 tablet 0   nitroGLYCERIN (NITROSTAT) 0.4 MG SL tablet Place 1 tablet (0.4 mg total) under the tongue every 5 (five) minutes as needed for chest pain. 30 tablet 0   venlafaxine XR (EFFEXOR-XR) 150 MG 24 hr capsule Take 1 capsule (150 mg total) by mouth daily. 30 capsule 0   ziprasidone (GEODON) 20 MG capsule Take 1 capsule (20 mg total) by mouth daily. 30 capsule 0   aspirin 81 MG chewable tablet      benzonatate (TESSALON) 200 MG capsule Take 200 mg by mouth every 8 (eight) hours as needed for cough. Congestion (Patient not taking: Reported on 06/29/2021)     Ergocalciferol (VITAMIN D2 PO) Take by mouth. 1,250 mcg (50,000 unit); oral Once A Day Every 7 Days Special Instructions: Give once a week x 2 doses.     feeding supplement (ENSURE ENLIVE / ENSURE PLUS) LIQD Take 237 mLs by mouth 2 (two) times daily between meals. 237 mL 12   guaifenesin (HUMIBID E) 400 MG TABS tablet Take 1 tablet by mouth 3 (three) times daily as needed.     iron polysaccharides (NIFEREX) 150 MG capsule Take 1 capsule (150 mg total) by mouth daily. (Patient not taking: Reported on 06/29/2021) 30 capsule 0   sodium chloride (OCEAN) 0.65 % nasal spray Place 1 spray into the nose 2 (two) times  daily as needed for congestion.     vitamin E 180 MG (400 UNITS) capsule Take 1 tablet by mouth daily.     No current facility-administered medications on file prior to visit.    LABS/IMAGING: No results found for this or any previous visit (from the past 48 hour(s)). No results found.  LIPID PANEL: No results found for: "CHOL", "TRIG", "HDL", "CHOLHDL", "VLDL", "LDLCALC", "LDLDIRECT"   WEIGHTS: Wt Readings from Last 3 Encounters:  07/12/21 240 lb (108.9 kg)  06/29/21 236 lb (107 kg)  06/05/21 243 lb (110.2 kg)    VITALS: BP 136/74   Pulse 64   Ht 6' (1.829 m)   Wt 240 lb (108.9 kg)   SpO2 98%   BMI  32.55 kg/m   EXAM: General appearance: alert and no distress Neck: no carotid bruit, no JVD, and thyroid not enlarged, symmetric, no tenderness/mass/nodules Lungs: clear to auscultation bilaterally Heart: regular rate and rhythm, S1, S2 normal, no murmur, click, rub or gallop Abdomen: soft, non-tender; bowel sounds normal; no masses,  no organomegaly Extremities: extremities normal, atraumatic, no cyanosis or edema Pulses: 2+ and symmetric Skin: Skin color, texture, turgor normal. No rashes or lesions Neurologic: Grossly normal Psych: Pleasant  EKG: Deferred  ASSESSMENT: NSTEMI (03/2021), in the setting of sepsis Recurrent chest pain History of coronary disease Hypertension Dyslipidemia  PLAN: 1.   [name] had a recent NSTEMI in the setting of sepsis while hospitalized in March and then had recurrent chest pain in May 2023 and was seen in the ER but ruled out for MI.  I am concerned about recurrent symptoms as well as the fact that he has historically known coronary artery disease as well as hypertension, dyslipidemia and other coronary risk factors.  Troponin did go up to greater than 2000 while he was septic.  This is concerning for underlying obstructive coronary disease.  Ultimately a definitive cardiac catheterization would make the most sense.  I did discuss the risks of this procedure including benefits and alternatives with him and he is daughter and they are agreeable to proceed.  We will arrange for cardiac catheterization next week with Dr. Excell Seltzer.  Plan follow-up with me afterwards.  Chrystie Nose, MD, [facility], FACP    [facility]  Medical Director of the Advanced Lipid Disorders &  Cardiovascular Risk Reduction Clinic Diplomate of the American Board of Clinical Lipidology Attending Cardiologist  Direct Dial: [phone]  Fax: [phone]  Website:  [email or web address] 07/15/2021, 9:04 PM

## 2021-07-15 NOTE — H&P (View-Only) (Signed)
 OFFICE NOTE  Chief Complaint:  Hospital follow-up  Primary Care Physician: [facility]  HPI:  Mario Massey is a 76 y.o. male with a past medial history significant for probable sepsis in May 2023, who I saw in the hospital due to elevated troponin concerning for NSTEMI.  Past medical history includes coronary artery disease per chart, hypertension, dyslipidemia and bipolar disorder.  He was admitted to [facility] for worsening nausea vomiting and hematuria.  He was noted to be in possible atrial flutter with a new right bundle branch block upon admission, but is also had some Wenckebach.  Troponin was initially mild elevated at 110 but rose up to 2362.  Is not felt to be a catheterization candidate at that time during the hospitalization and medical therapy was recommended.  Subsequently was discharged.  He was seen in the emergency department again in May 2023 for chest pain.  The work-up at that time was negative.  He was advised to follow-up with cardiology because he had not yet followed up with us from his original hospitalization.  PMHx:  Past Medical History:  Diagnosis Date   Bipolar disorder ([facility])    Coronary artery disease    Depression    Hypertension     Past Surgical History:  Procedure Laterality Date   KNEE SURGERY     with replacement on right knee    FAMHx:  History reviewed. No pertinent family history.  SOCHx:   reports that he has never smoked. He has never used smokeless tobacco. He reports current alcohol use. He reports that he does not use drugs.  ALLERGIES:  No Known Allergies  ROS: Pertinent items noted in HPI and remainder of comprehensive ROS otherwise negative.  HOME MEDS: Current Outpatient Medications on File Prior to Visit  Medication Sig Dispense Refill   bacitracin ointment Apply topically 2 (two) times daily. (Patient taking differently: Apply topically 2 (two) times daily. Apply topically to urethral meatus.  Apply to  sores of nose and corner of mouth.) 120 g 0   lisinopril (ZESTRIL) 5 MG tablet Take 1 tablet (5 mg total) by mouth daily. 30 tablet 0   nitroGLYCERIN (NITROSTAT) 0.4 MG SL tablet Place 1 tablet (0.4 mg total) under the tongue every 5 (five) minutes as needed for chest pain. 30 tablet 0   venlafaxine XR (EFFEXOR-XR) 150 MG 24 hr capsule Take 1 capsule (150 mg total) by mouth daily. 30 capsule 0   ziprasidone (GEODON) 20 MG capsule Take 1 capsule (20 mg total) by mouth daily. 30 capsule 0   aspirin 81 MG chewable tablet      benzonatate (TESSALON) 200 MG capsule Take 200 mg by mouth every 8 (eight) hours as needed for cough. Congestion (Patient not taking: Reported on 06/29/2021)     Ergocalciferol (VITAMIN D2 PO) Take by mouth. 1,250 mcg (50,000 unit); oral Once A Day Every 7 Days Special Instructions: Give once a week x 2 doses.     feeding supplement (ENSURE ENLIVE / ENSURE PLUS) LIQD Take 237 mLs by mouth 2 (two) times daily between meals. 237 mL 12   guaifenesin (HUMIBID E) 400 MG TABS tablet Take 1 tablet by mouth 3 (three) times daily as needed.     iron polysaccharides (NIFEREX) 150 MG capsule Take 1 capsule (150 mg total) by mouth daily. (Patient not taking: Reported on 06/29/2021) 30 capsule 0   sodium chloride (OCEAN) 0.65 % nasal spray Place 1 spray into the nose 2 (two) times   daily as needed for congestion.     vitamin E 180 MG (400 UNITS) capsule Take 1 tablet by mouth daily.     No current facility-administered medications on file prior to visit.    LABS/IMAGING: No results found for this or any previous visit (from the past 48 hour(s)). No results found.  LIPID PANEL: No results found for: "CHOL", "TRIG", "HDL", "CHOLHDL", "VLDL", "LDLCALC", "LDLDIRECT"   WEIGHTS: Wt Readings from Last 3 Encounters:  07/12/21 240 lb (108.9 kg)  06/29/21 236 lb (107 kg)  06/05/21 243 lb (110.2 kg)    VITALS: BP 136/74   Pulse 64   Ht 6' (1.829 m)   Wt 240 lb (108.9 kg)   SpO2 98%   BMI  32.55 kg/m   EXAM: General appearance: alert and no distress Neck: no carotid bruit, no JVD, and thyroid not enlarged, symmetric, no tenderness/mass/nodules Lungs: clear to auscultation bilaterally Heart: regular rate and rhythm, S1, S2 normal, no murmur, click, rub or gallop Abdomen: soft, non-tender; bowel sounds normal; no masses,  no organomegaly Extremities: extremities normal, atraumatic, no cyanosis or edema Pulses: 2+ and symmetric Skin: Skin color, texture, turgor normal. No rashes or lesions Neurologic: Grossly normal Psych: Pleasant  EKG: Deferred  ASSESSMENT: NSTEMI (03/2021), in the setting of sepsis Recurrent chest pain History of coronary disease Hypertension Dyslipidemia  PLAN: 1.   [name] had a recent NSTEMI in the setting of sepsis while hospitalized in March and then had recurrent chest pain in May 2023 and was seen in the ER but ruled out for MI.  I am concerned about recurrent symptoms as well as the fact that he has historically known coronary artery disease as well as hypertension, dyslipidemia and other coronary risk factors.  Troponin did go up to greater than 2000 while he was septic.  This is concerning for underlying obstructive coronary disease.  Ultimately a definitive cardiac catheterization would make the most sense.  I did discuss the risks of this procedure including benefits and alternatives with him and he is daughter and they are agreeable to proceed.  We will arrange for cardiac catheterization next week with Dr. Excell Seltzer.  Plan follow-up with me afterwards.  Chrystie Nose, MD, [facility], FACP    [facility]  Medical Director of the Advanced Lipid Disorders &  Cardiovascular Risk Reduction Clinic Diplomate of the American Board of Clinical Lipidology Attending Cardiologist  Direct Dial: [phone]  Fax: [phone]  Website:  [email or web address] 07/15/2021, 9:04 PM

## 2021-07-17 ENCOUNTER — Telehealth: Payer: Self-pay | Admitting: *Deleted

## 2021-07-17 NOTE — Telephone Encounter (Signed)
Patient's daughter, Lilyan Punt [facility]), reports patient has dementia/cognitive impairment. She is concerned patient may have problems answering questions accurately and understanding instructions and information given to him-she will be with patient tomorrow and is available to assist.  Lilyan Punt asked me to mention that patient does have indwelling foley catheter that was changed 07/13/21.

## 2021-07-17 NOTE — Telephone Encounter (Addendum)
Cardiac Catheterization scheduled at [facility] for: Wednesday July 18, 2021 11:30 AM Arrival time and place: [facility] Main Entrance A at: 9:30 AM   Nothing to eat after midnight prior to procedure, clear liquids until 5 AM day of procedure.  Medication instructions: -Usual morning medications can be taken with sips of water including aspirin 81 mg.  Confirmed patient has responsible adult to drive home post procedure and be with patient first 24 hours after arriving home.  Patient reports no new symptoms concerning for COVID-19 within the past 10 days.   Reviewed procedure instructions with patient's daughter, Lilyan Punt ([place]).

## 2021-07-18 ENCOUNTER — Encounter (HOSPITAL_COMMUNITY): Payer: Self-pay | Admitting: Cardiovascular Disease

## 2021-07-18 ENCOUNTER — Other Ambulatory Visit: Payer: Self-pay

## 2021-07-18 ENCOUNTER — Telehealth: Payer: Self-pay

## 2021-07-18 ENCOUNTER — Encounter (HOSPITAL_COMMUNITY)
Admission: RE | Disposition: A | Discharge status: Home / Self Care | Payer: Self-pay | Source: Ambulatory Visit | Attending: Cardiovascular Disease

## 2021-07-18 ENCOUNTER — Ambulatory Visit (HOSPITAL_COMMUNITY)
Admission: RE | Admit: 2021-07-18 | Discharge: 2021-07-18 | Disposition: A | Discharge status: Home / Self Care | Payer: No Typology Code available for payment source | Source: Ambulatory Visit | Attending: Cardiovascular Disease | Admitting: Cardiovascular Disease

## 2021-07-18 DIAGNOSIS — E785 Hyperlipidemia, unspecified: Secondary | ICD-10-CM | POA: Insufficient documentation

## 2021-07-18 DIAGNOSIS — I4892 Unspecified atrial flutter: Secondary | ICD-10-CM | POA: Insufficient documentation

## 2021-07-18 DIAGNOSIS — I209 Angina pectoris, unspecified: Secondary | ICD-10-CM

## 2021-07-18 DIAGNOSIS — I252 Old myocardial infarction: Secondary | ICD-10-CM | POA: Insufficient documentation

## 2021-07-18 DIAGNOSIS — I10 Essential (primary) hypertension: Secondary | ICD-10-CM | POA: Insufficient documentation

## 2021-07-18 DIAGNOSIS — Z8619 Personal history of other infectious and parasitic diseases: Secondary | ICD-10-CM | POA: Insufficient documentation

## 2021-07-18 DIAGNOSIS — I25119 Atherosclerotic heart disease of native coronary artery with unspecified angina pectoris: Secondary | ICD-10-CM | POA: Insufficient documentation

## 2021-07-18 HISTORY — PX: LEFT HEART CATH AND CORONARY ANGIOGRAPHY: CATH118249

## 2021-07-18 SURGERY — LEFT HEART CATH AND CORONARY ANGIOGRAPHY
Anesthesia: LOCAL

## 2021-07-18 MED ORDER — HYDRALAZINE HCL 20 MG/ML IJ SOLN: 10.0000 mg | INTRAMUSCULAR | Status: DC | PRN

## 2021-07-18 MED ORDER — LABETALOL HCL 5 MG/ML IV SOLN: 10.0000 mg | INTRAVENOUS | Status: DC | PRN

## 2021-07-18 MED ORDER — HEPARIN SODIUM (PORCINE) 1000 UNIT/ML IJ SOLN
INTRAMUSCULAR | Status: AC
  Filled 2021-07-18: qty 10

## 2021-07-18 MED ORDER — SODIUM CHLORIDE 0.9% FLUSH: 3.0000 mL | INTRAVENOUS | Status: DC | PRN

## 2021-07-18 MED ORDER — FENTANYL CITRATE (PF) 100 MCG/2ML IJ SOLN
INTRAMUSCULAR | Status: AC
  Filled 2021-07-18: qty 2

## 2021-07-18 MED ORDER — SODIUM CHLORIDE 0.9 % IV SOLN: 250.0000 mL | INTRAVENOUS | Status: DC | PRN

## 2021-07-18 MED ORDER — ACETAMINOPHEN 325 MG PO TABS: 650.0000 mg | ORAL_TABLET | ORAL | Status: DC | PRN

## 2021-07-18 MED ORDER — FENTANYL CITRATE (PF) 100 MCG/2ML IJ SOLN
INTRAMUSCULAR | Status: DC | PRN
  Administered 2021-07-18: 25 ug via INTRAVENOUS

## 2021-07-18 MED ORDER — ASPIRIN 81 MG PO CHEW: 81.0000 mg | CHEWABLE_TABLET | ORAL | Status: DC

## 2021-07-18 MED ORDER — VERAPAMIL HCL 2.5 MG/ML IV SOLN
INTRAVENOUS | Status: AC
Start: 2021-07-18 — End: ?
  Filled 2021-07-18: qty 2

## 2021-07-18 MED ORDER — SODIUM CHLORIDE 0.9 % WEIGHT BASED INFUSION
3.0000 mL/kg/h | INTRAVENOUS | Status: AC
  Administered 2021-07-18: 3 mL/kg/h via INTRAVENOUS

## 2021-07-18 MED ORDER — SODIUM CHLORIDE 0.9% FLUSH: 3.0000 mL | Freq: Two times a day (BID) | INTRAVENOUS | Status: DC

## 2021-07-18 MED ORDER — VERAPAMIL HCL 2.5 MG/ML IV SOLN
INTRAVENOUS | Status: AC
  Filled 2021-07-18: qty 2

## 2021-07-18 MED ORDER — SODIUM CHLORIDE 0.9 % WEIGHT BASED INFUSION: 1.0000 mL/kg/h | INTRAVENOUS | Status: DC

## 2021-07-18 MED ORDER — HEPARIN SODIUM (PORCINE) 1000 UNIT/ML IJ SOLN
INTRAMUSCULAR | Status: DC | PRN
  Administered 2021-07-18: 5000 [IU] via INTRAVENOUS

## 2021-07-18 MED ORDER — HEPARIN (PORCINE) IN NACL 1000-0.9 UT/500ML-% IV SOLN
INTRAVENOUS | Status: DC | PRN
  Administered 2021-07-18 (×2): 500 mL

## 2021-07-18 MED ORDER — LIDOCAINE HCL (PF) 1 % IJ SOLN
INTRAMUSCULAR | Status: AC
  Filled 2021-07-18: qty 30

## 2021-07-18 MED ORDER — VERAPAMIL HCL 2.5 MG/ML IV SOLN
INTRAVENOUS | Status: DC | PRN
  Administered 2021-07-18: 10 mL via INTRA_ARTERIAL

## 2021-07-18 MED ORDER — APIXABAN 5 MG PO TABS
5.0000 mg | ORAL_TABLET | Freq: Two times a day (BID) | ORAL | 5 refills | Status: DC
Start: 2021-07-18 — End: 2021-10-10

## 2021-07-18 MED ORDER — IOHEXOL 350 MG/ML SOLN
INTRAVENOUS | Status: DC | PRN
  Administered 2021-07-18: 45 mL

## 2021-07-18 MED ORDER — MIDAZOLAM HCL 2 MG/2ML IJ SOLN
INTRAMUSCULAR | Status: AC
  Filled 2021-07-18: qty 2

## 2021-07-18 MED ORDER — HEPARIN (PORCINE) IN NACL 1000-0.9 UT/500ML-% IV SOLN
INTRAVENOUS | Status: AC
  Filled 2021-07-18: qty 1000

## 2021-07-18 MED ORDER — LIDOCAINE HCL (PF) 1 % IJ SOLN
INTRAMUSCULAR | Status: DC | PRN
  Administered 2021-07-18: 2 mL

## 2021-07-18 MED ORDER — MIDAZOLAM HCL 2 MG/2ML IJ SOLN
INTRAMUSCULAR | Status: DC | PRN
  Administered 2021-07-18: 1 mg via INTRAVENOUS

## 2021-07-18 MED ORDER — ONDANSETRON HCL 4 MG/2ML IJ SOLN: 4.0000 mg | Freq: Four times a day (QID) | INTRAMUSCULAR | Status: DC | PRN

## 2021-07-18 SURGICAL SUPPLY — 10 items
BAND ZEPHYR COMPRESS 30 LONG (HEMOSTASIS) ×1 IMPLANT
CATH 5FR JL3.5 JR4 ANG PIG MP (CATHETERS) ×1 IMPLANT
ELECT DEFIB PAD ADLT CADENCE (PAD) ×1 IMPLANT
GLIDESHEATH SLEND SS 6F .021 (SHEATH) ×1 IMPLANT
GUIDEWIRE INQWIRE 1.5J.035X260 (WIRE) IMPLANT
INQWIRE 1.5J .035X260CM (WIRE) ×2
KIT HEART LEFT (KITS) ×2 IMPLANT
PACK CARDIAC CATHETERIZATION (CUSTOM PROCEDURE TRAY) ×2 IMPLANT
TRANSDUCER W/STOPCOCK (MISCELLANEOUS) ×2 IMPLANT
TUBING CIL FLEX 10 FLL-RA (TUBING) ×2 IMPLANT

## 2021-07-18 NOTE — Telephone Encounter (Signed)
Per Dr. Rennis Golden:  Mario Massey- Mr. Braley was noted to be in clear atrial flutter - this was questionable apparently when seen in the ER in May - EKG is different than in 03/2021 - cath was ok. Would advise he stop Aspirin - start Eliquis 5 mg BID - needs follow-up in 1 month - cannot miss Eliquis - if he is still in flutter, will need to be set up for cardioversion. Thanks. Dr. Rennis Golden

## 2021-07-18 NOTE — Progress Notes (Signed)
Patient and daughter was given discharge instructions. Both verbalized understanding. 

## 2021-07-18 NOTE — Discharge Instructions (Signed)

## 2021-07-18 NOTE — Telephone Encounter (Signed)
Spoke to daughter. Advised her to fill out PAF paperwork and had samples for pt.

## 2021-07-18 NOTE — Telephone Encounter (Signed)
Pt's daughter called back with insurance coverage issues with Eliquis. Transfer to Leonides Schanz, CMA.

## 2021-07-18 NOTE — Interval H&P Note (Signed)
Cath Lab Visit (complete for each Cath Lab visit)  Clinical Evaluation Leading to the Procedure:   ACS: No.  Non-ACS:    Anginal Classification: CCS III  Anti-ischemic medical therapy: No Therapy  Non-Invasive Test Results: No non-invasive testing performed  Prior CABG: No previous CABG      History and Physical Interval Note:  07/18/2021 10:33 AM  Mario Massey  has presented today for surgery, with the diagnosis of nstemi.  The various methods of treatment have been discussed with the patient and family. After consideration of risks, benefits and other options for treatment, the patient has consented to  Procedure(s): LEFT HEART CATH AND CORONARY ANGIOGRAPHY (N/A) as a surgical intervention.  The patient's history has been reviewed, patient examined, no change in status, stable for surgery.  I have reviewed the patient's chart and labs.  Questions were answered to the patient's satisfaction.     Tonny Bollman

## 2021-07-18 NOTE — Telephone Encounter (Signed)
Daughter notified and verbalized understanding. Messages sent to scheduler for a 1 month appt with APP for possible cardioversion. Eliquis 5 mg tablets sent to [facility] per daughters request.

## 2021-08-14 ENCOUNTER — Other Ambulatory Visit: Payer: Self-pay

## 2021-08-14 ENCOUNTER — Ambulatory Visit (INDEPENDENT_AMBULATORY_CARE_PROVIDER_SITE_OTHER): Payer: No Typology Code available for payment source | Admitting: Urology

## 2021-08-14 ENCOUNTER — Encounter: Payer: Self-pay | Admitting: Urology

## 2021-08-14 VITALS — BP 147/65 | HR 72

## 2021-08-14 DIAGNOSIS — R339 Retention of urine, unspecified: Secondary | ICD-10-CM | POA: Diagnosis not present

## 2021-08-14 DIAGNOSIS — Z978 Presence of other specified devices: Secondary | ICD-10-CM

## 2021-08-14 DIAGNOSIS — Z87440 Personal history of urinary (tract) infections: Secondary | ICD-10-CM

## 2021-08-14 NOTE — Progress Notes (Signed)
Assessment: 1. Urinary retention   2. Chronic indwelling Foley catheter   3. History of UTI     Plan: Urine culture sent today for documentation purposes. I discussed treatment of symptomatic UTIs only as he will likely be colonized due to the chronic Foley catheter. Second request for urology notes from the [place] [facility] in [place] regarding his prior evaluation. Continue Foley catheter - catheter changed today.  Return to office in 3-4 weeks for cystoscopy and catheter change. May need further evaluation with urodynamics pending results of above.  Chief Complaint:  Chief Complaint  Patient presents with   Urinary Retention    History of Present Illness:  Mario Massey is a 76 y.o. year old male who is seen for further evaluation of urinary retention and chronic Foley catheter.  He apparently has had a Foley catheter since March 2021.  He was previously followed by urology at the [facility] clinic in [place].  No records available for review.  Patient is unable to give any history of prior urologic evaluation with cystoscopy or urodynamics.  He does not think he has had a prior voiding trial.  He has been undergoing catheter changes monthly.  His catheter was changed by home health care on May 15.  He no longer has home health care services. He has had intermittent problems with catheter associated UTIs.  He was apparently admitted for sepsis secondary to a UTI in March 2023.  No recent fevers, chills, or gross hematuria.  Urine culture results: 7/22 >100 K Enterococcus, Pseudomonas 8/22 >100 K Pseudomonas 10/22 >100 K E. Coli 3/23 <10K colonies  CT abdomen and pelvis from 3/23 demonstrated normal kidneys, Foley catheter in decompressed bladder and a 8 mm calculus within the bladder. Renal ultrasound from 3/23 showed no renal mass or hydronephrosis and a decompressed bladder.  His foley was changed on 07/13/21.  He returns today for follow-up and Foley catheter change.  His  catheter is draining well.  He reports that he was treated for a UTI recently.  He completed antibiotics approximately 1 week ago.  A urine culture was obtained from the Foley bag prior to treatment.  No culture results available.  No recent fevers or chills.  No gross hematuria.   Portions of the above documentation were copied from a prior visit for review purposes only.    Past Medical History:  Past Medical History:  Diagnosis Date   Bipolar disorder (HCC)    Coronary artery disease    Depression    Hypertension     Past Surgical History:  Past Surgical History:  Procedure Laterality Date   KNEE SURGERY     with replacement on right knee   LEFT HEART CATH AND CORONARY ANGIOGRAPHY N/A 07/18/2021   Procedure: LEFT HEART CATH AND CORONARY ANGIOGRAPHY;  Surgeon: Tonny Bollman, MD;  Location: [facility] INVASIVE CV LAB;  Service: Cardiovascular;  Laterality: N/A;    Allergies:  No Known Allergies  Family History:  No family history on file.  Social History:  Social History   Tobacco Use   Smoking status: Never   Smokeless tobacco: Never  Vaping Use   Vaping Use: Never used  Substance Use Topics   Alcohol use: Yes    Comment: occasional   Drug use: No    ROS: Constitutional:  Negative for fever, chills, weight loss CV: Negative for chest pain, previous MI, hypertension Respiratory:  Negative for shortness of breath, wheezing, sleep apnea, frequent cough GI:  Negative for nausea, vomiting,  bloody stool, GERD  Physical exam: BP (!) 147/65   Pulse 72  GENERAL APPEARANCE:  Well appearing, well developed, well nourished, NAD HEENT:  Atraumatic, normocephalic, oropharynx clear NECK:  Supple without lymphadenopathy or thyromegaly ABDOMEN:  Soft, non-tender, no masses EXTREMITIES:  Moves all extremities well, without clubbing, cyanosis, or edema NEUROLOGIC:  Alert and oriented x 3, normal gait, CN II-XII grossly intact MENTAL STATUS:  appropriate BACK:  Non-tender to  palpation, No CVAT SKIN:  Warm, dry, and intact GU:  foley in place  Results: None

## 2021-08-14 NOTE — Progress Notes (Signed)
Cath Change/ Replacement  Patient is present today for a catheter change due to urinary retention.  95ml of water was removed from the balloon, a 18FR foley cath was removed with out difficulty.  Patient was cleaned and prepped in a sterile fashion with betadine. A 18 FR foley cath was replaced into the bladder no complications were noted Urine return was noted 33ml and urine was in yellow color. The balloon was filled with 21ml of sterile water. A leg bag was attached for drainage.  A night bag was also given to the patient and patient was given instruction on how to change from one bag to another. Patient was given proper instruction on catheter care.    Performed by: Kennyth Lose, CMA  Follow up: keep schedule appt.  Urine culture sent to lab

## 2021-08-19 LAB — URINE CULTURE

## 2021-08-22 ENCOUNTER — Encounter (HOSPITAL_COMMUNITY): Payer: Self-pay | Admitting: *Deleted

## 2021-08-22 ENCOUNTER — Other Ambulatory Visit: Payer: Self-pay

## 2021-08-22 DIAGNOSIS — K068 Other specified disorders of gingiva and edentulous alveolar ridge: Secondary | ICD-10-CM | POA: Diagnosis not present

## 2021-08-22 DIAGNOSIS — Z5321 Procedure and treatment not carried out due to patient leaving prior to being seen by health care provider: Secondary | ICD-10-CM | POA: Insufficient documentation

## 2021-08-22 NOTE — ED Triage Notes (Signed)
Pt in c/o dental bleeding onset today after he had 2 teeth extracted R upper and R lower teeth, pt states, "I do not know if I take blood thinners or not. I take so  many medications from the [place]." Pt has small amt of blood on wash cloth, pt A&O x4

## 2021-08-23 ENCOUNTER — Emergency Department (HOSPITAL_COMMUNITY)
Admission: EM | Admit: 2021-08-23 | Discharge: 2021-08-23 | Discharge status: Against Medical Advice | Payer: No Typology Code available for payment source | Attending: Emergency Medicine | Admitting: Emergency Medicine

## 2021-08-23 ENCOUNTER — Encounter (HOSPITAL_COMMUNITY): Payer: Self-pay

## 2021-08-23 ENCOUNTER — Ambulatory Visit: Payer: No Typology Code available for payment source | Admitting: Student

## 2021-08-23 ENCOUNTER — Emergency Department (HOSPITAL_COMMUNITY)
Admission: EM | Admit: 2021-08-23 | Discharge: 2021-08-23 | Disposition: A | Discharge status: Home / Self Care | Payer: No Typology Code available for payment source | Attending: Emergency Medicine | Admitting: Emergency Medicine

## 2021-08-23 ENCOUNTER — Telehealth: Payer: Self-pay

## 2021-08-23 DIAGNOSIS — Z7901 Long term (current) use of anticoagulants: Secondary | ICD-10-CM | POA: Insufficient documentation

## 2021-08-23 DIAGNOSIS — K91840 Postprocedural hemorrhage and hematoma of a digestive system organ or structure following a digestive system procedure: Secondary | ICD-10-CM | POA: Diagnosis present

## 2021-08-23 DIAGNOSIS — Z79899 Other long term (current) drug therapy: Secondary | ICD-10-CM | POA: Insufficient documentation

## 2021-08-23 DIAGNOSIS — K1379 Other lesions of oral mucosa: Secondary | ICD-10-CM

## 2021-08-23 DIAGNOSIS — I2510 Atherosclerotic heart disease of native coronary artery without angina pectoris: Secondary | ICD-10-CM | POA: Insufficient documentation

## 2021-08-23 DIAGNOSIS — I10 Essential (primary) hypertension: Secondary | ICD-10-CM | POA: Insufficient documentation

## 2021-08-23 MED ORDER — DOXYCYCLINE HYCLATE 100 MG PO CAPS: 100.0000 mg | ORAL_CAPSULE | Freq: Two times a day (BID) | ORAL | 0 refills | Status: AC

## 2021-08-23 MED ORDER — TRANEXAMIC ACID FOR EPISTAXIS
500.0000 mg | Freq: Once | TOPICAL | Status: DC
  Filled 2021-08-23: qty 10

## 2021-08-23 MED ORDER — "TRANEXAMIC ACID 5% ORAL SOLUTION ": 10.0000 mL | Freq: Once | ORAL | Status: DC

## 2021-08-23 MED ORDER — TRANEXAMIC ACID-NACL 1000-0.7 MG/100ML-% IV SOLN
1000.0000 mg | INTRAVENOUS | Status: DC
  Filled 2021-08-23: qty 100

## 2021-08-23 NOTE — Telephone Encounter (Signed)
Tired call patient several time and keep getting message "Call can't be completed at this time"

## 2021-08-23 NOTE — Telephone Encounter (Signed)
-----   Message from Milderd Meager, MD sent at 08/23/2021  8:47 AM EDT ----- Please notify Mario Massey that his urine culture did show evidence of a UTI.  I would like for him to begin antibiotics on 09/06/2021 in preparation for his cystoscopy appointment on 09/11/2021.  Prescription for doxycycline sent to his pharmacy.

## 2021-08-23 NOTE — ED Notes (Signed)
Signature pad unavailable for discharge  

## 2021-08-23 NOTE — ED Provider Notes (Signed)
[facility] EMERGENCY DEPARTMENT Provider Note   CSN: [id number] Arrival date & time: 08/23/21  0867     History  Chief Complaint  Patient presents with   Post-op Problem    Mario Massey is a 76 y.o. male w/ hx of HTN, HLD, CAD, NSTEMI here with post-tooth extraction bleeding.  The patient is a poor historian reports that he is "short-term memory problems".  His medication list shows that he was started on Eliquis 1 month ago.  He does feel that he is on a blood thinner.  He presented today because he reports he had teeth pulled a few days ago and has been bleeding from the site ever since.  The patient is a very poor historian.  Supplement history is provided by EMS.  Subsequently was able to speak to his daughter by phone who confirms that the patient is not a reliable historian.  He is noncompliant with medications.  She reports the patient had to have emergency tooth extraction because there were "broken fragments of teeth up there, and this was the indication for the tooth removal.  He has been still taking his Eliquis medicine since then.  She denies that they were told to stop taking the blood thinner medicine.  She says he has been bleeding on and off for 2 days, was actually seen at the dental office yesterday and they tried to apply some clotting agent to his teeth.  HPI     Home Medications Prior to Admission medications   Medication Sig Start Date End Date Taking? Authorizing Provider  apixaban (ELIQUIS) 5 MG TABS tablet Take 1 tablet (5 mg total) by mouth 2 (two) times daily. 07/18/21   Hilty, Lisette Abu, MD  bacitracin ointment Apply topically 2 (two) times daily. Patient taking differently: Apply 1 Application topically 2 (two) times daily as needed for wound care. Apply topically to urethral meatus.  Apply to sores of nose and corner of mouth. 04/23/21   Marguerita Merles Latif, DO  doxycycline (VIBRAMYCIN) 100 MG capsule Take 1 capsule (100 mg total) by mouth every 12  (twelve) hours for 7 days. Begin on 09/06/21. 08/23/21 08/30/21  Milderd Meager., MD  lisinopril (ZESTRIL) 5 MG tablet Take 1 tablet (5 mg total) by mouth daily. 05/11/21   Sharee Holster, NP  nitroGLYCERIN (NITROSTAT) 0.4 MG SL tablet Place 1 tablet (0.4 mg total) under the tongue every 5 (five) minutes as needed for chest pain. 05/11/21   Sharee Holster, NP  venlafaxine XR (EFFEXOR-XR) 150 MG 24 hr capsule Take 1 capsule (150 mg total) by mouth daily. 05/11/21   Sharee Holster, NP  ziprasidone (GEODON) 20 MG capsule Take 1 capsule (20 mg total) by mouth daily. 05/11/21   Sharee Holster, NP      Allergies    Patient has no known allergies.    Review of Systems   Review of Systems  Physical Exam Updated Vital Signs BP (!) 173/48   Pulse (!) 46   Resp 18   Ht 6' (1.829 m)   Wt 111 kg   SpO2 97%   BMI 33.19 kg/m  Physical Exam Constitutional:      General: He is not in acute distress. HENT:     Head: Normocephalic and atraumatic.     Comments: Region of oozing and bleeding from right upper molar extraction Eyes:     Conjunctiva/sclera: Conjunctivae normal.     Pupils: Pupils are equal, round, and reactive to light.  Cardiovascular:     Rate and Rhythm: Normal rate and regular rhythm.  Pulmonary:     Effort: Pulmonary effort is normal. No respiratory distress.  Abdominal:     General: There is no distension.     Tenderness: There is no abdominal tenderness.  Skin:    General: Skin is warm and dry.  Neurological:     General: No focal deficit present.     Mental Status: He is alert. Mental status is at baseline.  Psychiatric:        Mood and Affect: Mood normal.        Behavior: Behavior normal.     ED Results / Procedures / Treatments   Labs (all labs ordered are listed, but only abnormal results are displayed) Labs Reviewed - No data to display  EKG None  Radiology No results found.  Procedures Procedures    Medications Ordered in ED Medications   tranexamic acid (CYKLOKAPRON) IVPB 1,000 mg (has no administration in time range)    ED Course/ Medical Decision Making/ A&P Clinical Course as of 08/23/21 1032  Thu Aug 23, 2021  0922 I spoke to the patient's daughter by phone to provide supplemental history, which was updated in the chart.  She will come pick him [MT]    Clinical Course User Index [MT] Yasha Tibbett, Kermit Balo, MD                           Medical Decision Making Risk Prescription drug management.   Patient is here with post dental extraction bleeding, hemodynamically stable on arrival.  He is on Eliquis.  We will try TXA rinse.  There is no hemorrhage at the moment.  Bleeding appears improved overall for his TXA range.  I do see the plugging sites from the clotting agent provided by his dentist.  I would try not to dislodge or remove these.  Supplement history provided by his daughter on the phone.  She will be coming to pick him up.  I do not see evidence of significant hemorrhage or concern for life-threatening bleeding.  I did advise that they hold the Eliquis for 2 days, which may help with the bleeding, I do believe that the benefits of holding Eliquis outweigh the risks in this case.    I otherwise think he is stable for discharge at this time        Final Clinical Impression(s) / ED Diagnoses Final diagnoses:  Oral bleeding    Rx / DC Orders ED Discharge Orders     None         Terald Sleeper, MD 08/23/21 1032

## 2021-08-23 NOTE — ED Notes (Signed)
Per MD, Trifan, tranexamic acid IVPB to be given as an oral solution and then to be backed with cylinder-shaped gauze instead if IVPB--as pharmacist does not have oral solution

## 2021-08-23 NOTE — Telephone Encounter (Signed)
-----   Message from Bradley J. Stoneking, MD sent at 08/23/2021  8:47 AM EDT ----- Please notify [name] that his urine culture did show evidence of a UTI.  I would like for him to begin antibiotics on 09/06/2021 in preparation for his cystoscopy appointment on 09/11/2021.  Prescription for doxycycline sent to his pharmacy. 

## 2021-08-23 NOTE — Discharge Instructions (Signed)
I recommend that you HOLD your next 2 days of eliquis to help with the bleeding.  Please call Dolan's dentist office and see if they can arrange for another check up today or tomorrow.

## 2021-08-23 NOTE — ED Triage Notes (Signed)
Rcems from home. Was here last night for same complaint without being seen. Had two teeth pulled recently on the right side. Says it keeps bleeding however it appears patient keeps messing with site.

## 2021-08-23 NOTE — Addendum Note (Signed)
Addended by: Milderd Meager on: 08/23/2021 08:47 AM   Modules accepted: Orders

## 2021-08-29 NOTE — Telephone Encounter (Signed)
Mychart message sent.

## 2021-09-11 ENCOUNTER — Ambulatory Visit (INDEPENDENT_AMBULATORY_CARE_PROVIDER_SITE_OTHER): Payer: No Typology Code available for payment source | Admitting: Urology

## 2021-09-11 VITALS — BP 160/70 | HR 69

## 2021-09-11 DIAGNOSIS — Z87440 Personal history of urinary (tract) infections: Secondary | ICD-10-CM

## 2021-09-11 DIAGNOSIS — R339 Retention of urine, unspecified: Secondary | ICD-10-CM

## 2021-09-11 DIAGNOSIS — Z978 Presence of other specified devices: Secondary | ICD-10-CM

## 2021-09-11 MED ORDER — CIPROFLOXACIN HCL 500 MG PO TABS: 500.0000 mg | ORAL_TABLET | Freq: Once | ORAL | Status: DC

## 2021-09-11 NOTE — Progress Notes (Signed)
Assessment: 1. Urinary retention   2. Chronic indwelling Foley catheter   3. History of UTI     Plan: Continue Foley catheter - catheter changed today.  I discussed the findings on cystoscopy with the patient and his daughter. Recommend further evaluation with urodynamics given his history of chronic urinary retention and minimal prostate enlargement on cystoscopy. Return to office after urodynamics completed. Complete doxycycline as prescribed.  Chief Complaint:  Chief Complaint  Patient presents with   Urinary Retention    History of Present Illness:  Mario Massey is a 76 y.o. year old male who is seen for further evaluation of urinary retention and chronic Foley catheter.  He apparently has had a Foley catheter since March 2021.  He was previously followed by urology at the [facility] clinic in [place].  No records available for review.  Patient is unable to give any history of prior urologic evaluation with cystoscopy or urodynamics.  He does not think he has had a prior voiding trial.  He has been undergoing catheter changes monthly.  His catheter was changed by home health care on May 15.  He no longer has home health care services. He has had intermittent problems with catheter associated UTIs.  He was apparently admitted for sepsis secondary to a UTI in March 2023.  No recent fevers, chills, or gross hematuria.  Urine culture results: 7/22 >100 K Enterococcus, Pseudomonas 8/22 >100 K Pseudomonas 10/22 >100 K E. Coli 3/23 <10K colonies  CT abdomen and pelvis from 3/23 demonstrated normal kidneys, Foley catheter in decompressed bladder and a 8 mm calculus within the bladder. Renal ultrasound from 3/23 showed no renal mass or hydronephrosis and a decompressed bladder.  His foley was changed on 08/14/21. Urine culture from 08/14/21 grew >100K Staph and 25-50K Pseudomonas.  Patient started on doxycycline prior to visit today. He presents today for cystoscopy and foley  change. His catheter has been draining well.  No gross hematuria.   Portions of the above documentation were copied from a prior visit for review purposes only.    Past Medical History:  Past Medical History:  Diagnosis Date   Bipolar disorder (HCC)    Coronary artery disease    Depression    Hypertension     Past Surgical History:  Past Surgical History:  Procedure Laterality Date   KNEE SURGERY     with replacement on right knee   LEFT HEART CATH AND CORONARY ANGIOGRAPHY N/A 07/18/2021   Procedure: LEFT HEART CATH AND CORONARY ANGIOGRAPHY;  Surgeon: Tonny Bollman, MD;  Location: [facility] INVASIVE CV LAB;  Service: Cardiovascular;  Laterality: N/A;    Allergies:  No Known Allergies  Family History:  No family history on file.  Social History:  Social History   Tobacco Use   Smoking status: Never   Smokeless tobacco: Never  Vaping Use   Vaping Use: Never used  Substance Use Topics   Alcohol use: Yes    Comment: occasional   Drug use: No    ROS: Constitutional:  Negative for fever, chills, weight loss CV: Negative for chest pain, previous MI, hypertension Respiratory:  Negative for shortness of breath, wheezing, sleep apnea, frequent cough GI:  Negative for nausea, vomiting, bloody stool, GERD   Physical exam: BP (!) 160/70   Pulse 69  GENERAL APPEARANCE:  Well appearing, well developed, well nourished, NAD HEENT:  Atraumatic, normocephalic, oropharynx clear NECK:  Supple without lymphadenopathy or thyromegaly ABDOMEN:  Soft, non-tender, no masses EXTREMITIES:  Moves all  extremities well, without clubbing, cyanosis, or edema NEUROLOGIC:  Alert and oriented x 3, CN II-XII grossly intact MENTAL STATUS:  appropriate BACK:  Non-tender to palpation, No CVAT SKIN:  Warm, dry, and intact  Results: None  Procedure:  Flexible Cystourethroscopy  Pre-operative Diagnosis:  Urinary retention  Post-operative Diagnosis:  Urinary retention  Anesthesia:  local with  lidocaine jelly  Surgical Narrative:  After appropriate informed consent was obtained, the patient was prepped and draped in the usual sterile fashion in the supine position.  The patient was correctly identified and the proper procedure delineated prior to proceeding.  Sterile lidocaine gel was instilled in the urethra. The flexible cystoscope was introduced without difficulty.  Findings:  Anterior urethra: Normal  Posterior urethra:  mild lateral lobe enlargement, no median lobe  Bladder:  bilateral bladder diverticulum, mucosal edema on posterior bladder wall; no stones  Ureteral orifices: normal  Additional findings: None  Saline bladder wash for cytology was not performed.    The cystoscope was then removed.  The patient tolerated the procedure well. Foley replaced at end of procedure.

## 2021-09-20 DIAGNOSIS — M19011 Primary osteoarthritis, right shoulder: Secondary | ICD-10-CM | POA: Insufficient documentation

## 2021-10-02 ENCOUNTER — Encounter: Payer: Self-pay | Admitting: Physician Assistant

## 2021-10-02 NOTE — Progress Notes (Deleted)
Cardiology Office Note    Date:  10/02/2021   ID:  Mario Massey, DOB [birth year], MRN [id number]  PCP:  [facility]  Cardiologist:  None  Electrophysiologist:  None   Chief Complaint: ***  History of Present Illness:   Mario Massey is a 76 y.o. 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 He was admitted 03/2021 with sepsis due to UTI, c/b AKI, lactic acidosis, hypophosphatemia, hypomagnesemia, thrombocytopenia, hematuria with indwelling foley, normocytic anemia, hyperbilirubinemia, transaminitis, and hypoalbuminemia. Cardiology consulted during admission for posssible NSTEMI (troponin rose to 2362) as well as possible atrial flutter and AV dissociation, bradycardia, and Wenckebach noted. It was recommended to avoid AVN blocking agents. He was not felt to be a candidate for anticoagulation at that time. Cath was also deferred in light of multiple medical issues. Echo showed EF 55-60%, mildly elevated PASP, dilation of ascending aorta of 43mm, mild AI. He ultimately underwent outpatient cath once more stable 06/2021 showing widely patent coronary arteries with only a single lesion noted at a flexion point in the distal LAD (small area of myocardium supplied beyond the lesion) (75% dLAD, 30% prox-mid LAD), medical therapy recommended. He was in atrial flutter during study so started on anticoagulation. 1 month follow-up was cancelled and r/s.  Cta aorta Osa study F/u labs - no tsh, no lipids  Persistent atrial flutter Nonobstructive CAD, HLD Second degree type 1 AVB and h/o AV dissociation, RBBB Essential HTN Dilation of ascending aorta  Labwork independently reviewed: 06/2021 CBC wnl, BMET wnl, Cr 4.9, Cr 0.95,   04/2021 AST/ALT OK, albumin 3.2 03/2021 Mg 2.4   Cardiology Studies:   Studies reviewed are outlined and summarized above. Reports included below if pertinent.   Cath 07/18/21   Dist LAD lesion is 75% stenosed.   Prox LAD to Mid LAD lesion is 30% stenosed.   Widely patent coronary arteries with a single lesion noted at a flexion point in the distal LAD (small area of myocardium supplied beyond the lesion)   Normal LVEDP   Recommend medical therapy for isolated distal lesion in single vessel distribution   Pt noted to be in atrial flutter today. Will review with Dr Rennis Golden for consideration of oral anticoagulation.  Echo 03/2021     1. Left ventricular ejection fraction, by estimation, is 55 to 60%. The  left ventricle has normal function. The left ventricle has no regional  wall motion abnormalities. There is moderate left ventricular hypertrophy.  Left ventricular diastolic  parameters are indeterminate.   2. Right ventricular systolic function is normal. The right ventricular  size is mildly enlarged. There is mildly elevated pulmonary artery  systolic pressure. The estimated right ventricular systolic pressure is  45.0 mmHg.   3. Left atrial size was moderately dilated.   4. Right atrial size was mildly dilated.   5. The mitral valve is normal in structure. Trivial mitral valve  regurgitation. No evidence of mitral stenosis.   6. The aortic valve is tricuspid. Aortic valve regurgitation is mild. No  aortic stenosis is present.   7. Aortic dilatation noted. There is dilatation of the ascending aorta,  measuring 40 mm.     Past Medical History:  Diagnosis Date   Bipolar disorder (HCC)    Coronary artery disease  Depression    Hypertension     Past Surgical History:  Procedure Laterality Date   KNEE SURGERY     with replacement on right knee   LEFT HEART CATH AND CORONARY ANGIOGRAPHY N/A 07/18/2021   Procedure: LEFT HEART CATH AND CORONARY ANGIOGRAPHY;  Surgeon:  Tonny Bollman, MD;  Location: [facility] INVASIVE CV LAB;  Service: Cardiovascular;  Laterality: N/A;    Current Medications: No outpatient medications have been marked as taking for the 10/04/21 encounter (Appointment) with Laurann Montana, PA-C.   ***   Allergies:   Patient has no known allergies.   Social History   Socioeconomic History   Marital status: Divorced    Spouse name: Not on file   Number of children: Not on file   Years of education: Not on file   Highest education level: Not on file  Occupational History   Not on file  Tobacco Use   Smoking status: Never   Smokeless tobacco: Never  Vaping Use   Vaping Use: Never used  Substance and Sexual Activity   Alcohol use: Yes    Comment: occasional   Drug use: No   Sexual activity: Not on file  Other Topics Concern   Not on file  Social History Narrative   Not on file   Social Determinants of Health   Financial Resource Strain: Not on file  Food Insecurity: Not on file  Transportation Needs: Not on file  Physical Activity: Not on file  Stress: Not on file  Social Connections: Not on file     Family History:  The patient's ***family history is not on file.  ROS:   Please see the history of present illness. Otherwise, review of systems is positive for ***.  All other systems are reviewed and otherwise negative.    EKG(s)/Additional Labs   EKG:  EKG is ordered today, personally reviewed, demonstrating ***  Recent Labs: 04/30/2021: ALT 29; Magnesium 1.9 07/12/2021: BUN 21; Creatinine, Ser 0.95; Hemoglobin 13.3; Platelets 170; Potassium 4.9; Sodium 140  Recent Lipid Panel No results found for: "CHOL", "TRIG", "HDL", "CHOLHDL", "VLDL", "LDLCALC", "LDLDIRECT"  PHYSICAL EXAM:    VS:  There were no vitals taken for this visit.  BMI: There is no height or weight on file to calculate BMI.  GEN: Well nourished, well developed male in no acute distress HEENT: normocephalic, atraumatic Neck: no JVD, carotid bruits, or  masses Cardiac: ***RRR; no murmurs, rubs, or gallops, no edema  Respiratory:  clear to auscultation bilaterally, normal work of breathing GI: soft, nontender, nondistended, + BS MS: no deformity or atrophy Skin: warm and dry, no rash Neuro:  Alert and Oriented x 3, Strength and sensation are intact, follows commands Psych: euthymic mood, full affect  Wt Readings from Last 3 Encounters:  08/23/21 244 lb 11.4 oz (111 kg)  07/18/21 243 lb (110.2 kg)  07/12/21 240 lb (108.9 kg)     ASSESSMENT & PLAN:   ***     Disposition: F/u with ***   Medication Adjustments/Labs and Tests Ordered: Current medicines are reviewed at length with the patient today.  Concerns regarding medicines are outlined above. Medication changes, Labs and Tests ordered today are summarized above and listed in the Patient Instructions accessible in Encounters.    Signed, Laurann Montana, PA-C  10/02/2021 10:28 AM    [facility] [place] Location in [facility] [address] [place], [place] [address] Ph: [phone]; Fax [phone]

## 2021-10-04 ENCOUNTER — Ambulatory Visit: Payer: No Typology Code available for payment source | Admitting: Physician Assistant

## 2021-10-04 ENCOUNTER — Telehealth: Payer: Self-pay

## 2021-10-04 DIAGNOSIS — I10 Essential (primary) hypertension: Secondary | ICD-10-CM

## 2021-10-04 DIAGNOSIS — I4589 Other specified conduction disorders: Secondary | ICD-10-CM

## 2021-10-04 DIAGNOSIS — I441 Atrioventricular block, second degree: Secondary | ICD-10-CM

## 2021-10-04 DIAGNOSIS — I2510 Atherosclerotic heart disease of native coronary artery without angina pectoris: Secondary | ICD-10-CM

## 2021-10-04 DIAGNOSIS — E785 Hyperlipidemia, unspecified: Secondary | ICD-10-CM

## 2021-10-04 DIAGNOSIS — I4510 Unspecified right bundle-branch block: Secondary | ICD-10-CM

## 2021-10-04 DIAGNOSIS — Z8679 Personal history of other diseases of the circulatory system: Secondary | ICD-10-CM

## 2021-10-04 DIAGNOSIS — I77810 Thoracic aortic ectasia: Secondary | ICD-10-CM

## 2021-10-04 NOTE — Telephone Encounter (Signed)
Daughter called with concerns of a UTI, she states the patient is experiencing cold chills, no appetite, and body aches.  Verbal from Dr. Pete Glatter considering his symptoms and his history he recommends him be checked out by his PCP or an urgent care.  Daughter advised and agreed to have patient be seen by PCP or urgent care.

## 2021-10-05 NOTE — Progress Notes (Unsigned)
Cardiology Office Note:    Date:  10/05/2021   ID:  Mario Massey, DOB 05/27/1945, MRN [id number]  PCP:  [facility] Providers Cardiologist:  Chrystie Nose, MD { Click to update primary MD,subspecialty MD or APP then REFRESH:1}    Referring MD: [facility], [facility]   Chief Complaint:  No chief complaint on file. {Click here for Visit Info    :1}    History of Present Illness:   Mario Massey is a 76 y.o. male  with a past medial history significant for probable sepsis in May 2023, who I saw in the hospital due to elevated troponin concerning for NSTEMI.  Past medical history includes coronary artery disease per chart, hypertension, dyslipidemia and bipolar disorder.  He was admitted to [facility] for worsening nausea vomiting and hematuria.  He was noted to be in possible atrial flutter with a new right bundle branch block upon admission, but is also had some Wenckebach.  Troponin was initially mild elevated at 110 but rose up to 2362.  Is not felt to be a catheterization candidate at that time during the hospitalization and medical therapy was recommended.  Subsequently was discharged.  He was seen in the emergency department again in May 2023 for chest pain.  The work-up at that time was negative.  He underwent LHC 07/18/21 and had nonobstructive CAD with dLAD 75% but was in atrial flutter. He was started on eliquis with plans for f/u and DCCV if still in Aflutter.    Past Medical History:  Diagnosis Date   Ascending aorta dilation (HCC)    Atrial flutter (HCC)    AV dissociation    Bipolar disorder (HCC)    Coronary artery disease    Depression    Essential hypertension    Hyperlipidemia LDL goal <70    Hypertension    RBBB    Second degree AV block, Mobitz type I    Current Medications: No outpatient medications have been marked as taking for the 10/10/21 encounter (Appointment) with Dyann Kief, PA-C.    Allergies:   Patient has  no known allergies.   Social History   Tobacco Use   Smoking status: Never   Smokeless tobacco: Never  Vaping Use   Vaping Use: Never used  Substance Use Topics   Alcohol use: Yes    Comment: occasional   Drug use: No    Family Hx: The patient's family history is not on file.  ROS   EKGs/Labs/Other Test Reviewed:    EKG:  EKG is *** ordered today.  The ekg ordered today demonstrates ***  Recent Labs: 04/30/2021: ALT 29; Magnesium 1.9 07/12/2021: BUN 21; Creatinine, Ser 0.95; Hemoglobin 13.3; Platelets 170; Potassium 4.9; Sodium 140   Recent Lipid Panel No results for input(s): "CHOL", "TRIG", "HDL", "VLDL", "LDLCALC", "LDLDIRECT" in the last 8760 hours.   Prior CV Studies: ECHO COMPLETE WO IMAGING ENHANCING AGENT 04/20/2021  Narrative ECHOCARDIOGRAM REPORT    Patient Name:   Mario Massey Date of Exam: 04/20/2021 Medical Rec #:  [id number]       Height:       72.0 in Accession #:    [id number]      Weight:       255.7 lb Date of Birth:  August 12, 1945       BSA:          2.365 m Patient Age:    76 years        BP:  123/47 mmHg Patient Gender: M               HR:           69 bpm. [facility]  Procedure: 2D Echo, Cardiac Doppler and Color Doppler  Indications:    Elevated Troponin  History:        Patient has no prior history of Echocardiogram examinations. Previous Myocardial Infarction and CAD; Risk Factors:Hypertension.  Sonographer:    Mikki Harbor Referring Phys: [id number] Ellsworth Lennox  IMPRESSIONS   1. Left ventricular ejection fraction, by estimation, is 55 to 60%. The left ventricle has normal function. The left ventricle has no regional wall motion abnormalities. There is moderate left ventricular hypertrophy. Left ventricular diastolic parameters are indeterminate. 2. Right ventricular systolic function is normal. The right ventricular size is mildly enlarged. There is mildly elevated pulmonary artery systolic pressure.  The estimated right ventricular systolic pressure is 45.0 mmHg. 3. Left atrial size was moderately dilated. 4. Right atrial size was mildly dilated. 5. The mitral valve is normal in structure. Trivial mitral valve regurgitation. No evidence of mitral stenosis. 6. The aortic valve is tricuspid. Aortic valve regurgitation is mild. No aortic stenosis is present. 7. Aortic dilatation noted. There is dilatation of the ascending aorta, measuring 40 mm.  FINDINGS Left Ventricle: Left ventricular ejection fraction, by estimation, is 55 to 60%. The left ventricle has normal function. The left ventricle has no regional wall motion abnormalities. The left ventricular internal cavity size was normal in size. There is moderate left ventricular hypertrophy. Left ventricular diastolic parameters are indeterminate.  Right Ventricle: The right ventricular size is mildly enlarged. No increase in right ventricular wall thickness. Right ventricular systolic function is normal. There is mildly elevated pulmonary artery systolic pressure. The tricuspid regurgitant velocity is 3.04 m/s, and with an assumed right atrial pressure of 8 mmHg, the estimated right ventricular systolic pressure is 45.0 mmHg.  Left Atrium: Left atrial size was moderately dilated.  Right Atrium: Right atrial size was mildly dilated.  Pericardium: There is no evidence of pericardial effusion.  Mitral Valve: The mitral valve is normal in structure. Trivial mitral valve regurgitation. No evidence of mitral valve stenosis. MV peak gradient, 5.5 mmHg. The mean mitral valve gradient is 1.0 mmHg.  Tricuspid Valve: The tricuspid valve is normal in structure. Tricuspid valve regurgitation is trivial.  Aortic Valve: The aortic valve is tricuspid. Aortic valve regurgitation is mild. Aortic regurgitation PHT measures 582 msec. No aortic stenosis is present. Aortic valve mean gradient measures 3.0 mmHg. Aortic valve peak gradient measures 7.5 mmHg.  Aortic valve area, by VTI measures 2.19 cm.  Pulmonic Valve: The pulmonic valve was grossly normal. Pulmonic valve regurgitation is not visualized.  Aorta: The aortic root is normal in size and structure and aortic dilatation noted. There is dilatation of the ascending aorta, measuring 40 mm.  IAS/Shunts: The interatrial septum was not well visualized.   LEFT VENTRICLE PLAX 2D LVIDd:         5.10 cm      Diastology LVIDs:         3.50 cm      LV e' medial:    7.83 cm/s LV PW:         1.50 cm      LV E/e' medial:  11.8 LV IVS:        1.40 cm      LV e' lateral:   10.80 cm/s LVOT diam:  2.00 cm      LV E/e' lateral: 8.5 LV SV:         68 LV SV Index:   29 LVOT Area:     3.14 cm  LV Volumes (MOD) LV vol d, MOD A2C: 83.9 ml LV vol d, MOD A4C: 119.0 ml LV vol s, MOD A2C: 45.7 ml LV vol s, MOD A4C: 43.2 ml LV SV MOD A2C:     38.2 ml LV SV MOD A4C:     119.0 ml LV SV MOD BP:      55.1 ml  RIGHT VENTRICLE RV Basal diam:  4.15 cm RV Mid diam:    3.40 cm RV S prime:     17.30 cm/s TAPSE (M-mode): 2.4 cm  LEFT ATRIUM              Index        RIGHT ATRIUM           Index LA diam:        5.80 cm  2.45 cm/m   RA Area:     24.30 cm LA Vol (A2C):   101.0 ml 42.70 ml/m  RA Volume:   80.40 ml  33.99 ml/m LA Vol (A4C):   97.9 ml  41.39 ml/m LA Biplane Vol: 109.0 ml 46.09 ml/m AORTIC VALVE                    PULMONIC VALVE AV Area (Vmax):    2.61 cm     PV Vmax:       0.92 m/s AV Area (Vmean):   2.72 cm     PV Peak grad:  3.4 mmHg AV Area (VTI):     2.19 cm AV Vmax:           137.00 cm/s AV Vmean:          82.400 cm/s AV VTI:            0.311 m AV Peak Grad:      7.5 mmHg AV Mean Grad:      3.0 mmHg LVOT Vmax:         114.00 cm/s LVOT Vmean:        71.300 cm/s LVOT VTI:          0.217 m LVOT/AV VTI ratio: 0.70 AI PHT:            582 msec  AORTA Ao Root diam: 3.30 cm Ao Asc diam:  4.00 cm  MITRAL VALVE               TRICUSPID VALVE MV Area (PHT): 3.48 cm    TR  Peak grad:   37.0 mmHg MV Area VTI:   1.89 cm    TR Vmax:        304.00 cm/s MV Peak grad:  5.5 mmHg MV Mean grad:  1.0 mmHg    SHUNTS MV Vmax:       1.17 m/s    Systemic VTI:  0.22 m MV Vmean:      51.1 cm/s   Systemic Diam: 2.00 cm MV Decel Time: 218 msec MV E velocity: 92.10 cm/s MV A velocity: 34.70 cm/s MV E/A ratio:  2.65  Epifanio Lesches MD Electronically signed by Epifanio Lesches MD Signature Date/Time: 04/20/2021/9:53:50 AM    Final      Dist LAD lesion is 75% stenosed.   Prox LAD to Mid LAD lesion is 30% stenosed.   Widely patent coronary arteries with a  single lesion noted at a flexion point in the distal LAD (small area of myocardium supplied beyond the lesion)   Normal LVEDP   Recommend medical therapy for isolated distal lesion in single vessel distribution   Pt noted to be in atrial flutter today. Will review with Dr Rennis Golden for consideration of oral anticoagulation.    Risk Assessment/Calculations/Metrics:    CHA2DS2-VASc Score =    {Click here to calculate score.  REFRESH note before signing. :1} This indicates a  % annual risk of stroke. The patient's score is based upon:          No BP recorded.  {Refresh Note OR Click here to enter BP  :1}***    Physical Exam:    VS:  There were no vitals taken for this visit.    Wt Readings from Last 3 Encounters:  08/23/21 244 lb 11.4 oz (111 kg)  07/18/21 243 lb (110.2 kg)  07/12/21 240 lb (108.9 kg)    Physical Exam  GEN: Well nourished, well developed, in no acute distress  HEENT: normal  Neck: no JVD, carotid bruits, or masses Cardiac:RRR; no murmurs, rubs, or gallops  Respiratory:  clear to auscultation bilaterally, normal work of breathing GI: soft, nontender, nondistended, + BS Ext: without cyanosis, clubbing, or edema, Good distal pulses bilaterally MS: no deformity or atrophy  Skin: warm and dry, no rash Neuro:  Alert and Oriented x 3, Strength and sensation are intact Psych:  euthymic mood, full affect       ASSESSMENT & PLAN:   No problem-specific Assessment & Plan notes found for this encounter.   Atrial flutter noted during cath started on eliquis with plans for DCCV if still in it.  CAD NSTEMI in the setting of sepsis 05/2021, cath 06/2021 nonobstructive CAD  HTN  HLD        {Are you ordering a CV Procedure (e.g. stress test, cath, DCCV, TEE, etc)?   Press F2        :[id number]}   Dispo:  No follow-ups on file.   Medication Adjustments/Labs and Tests Ordered: Current medicines are reviewed at length with the patient today.  Concerns regarding medicines are outlined above.  Tests Ordered: No orders of the defined types were placed in this encounter.  Medication Changes: No orders of the defined types were placed in this encounter.  Signed, Jacolyn Reedy, PA-C  10/05/2021 7:57 AM    [facility] [address] [place], [place], [place]  [address] Phone: [phone]; Fax: [phone]

## 2021-10-10 ENCOUNTER — Encounter: Payer: Self-pay | Admitting: Physician Assistant

## 2021-10-10 ENCOUNTER — Ambulatory Visit: Payer: No Typology Code available for payment source | Attending: Student | Admitting: Physician Assistant

## 2021-10-10 VITALS — BP 132/58 | HR 61 | Ht 72.0 in | Wt 245.0 lb

## 2021-10-10 DIAGNOSIS — I4892 Unspecified atrial flutter: Secondary | ICD-10-CM

## 2021-10-10 DIAGNOSIS — E785 Hyperlipidemia, unspecified: Secondary | ICD-10-CM

## 2021-10-10 DIAGNOSIS — I10 Essential (primary) hypertension: Secondary | ICD-10-CM

## 2021-10-10 DIAGNOSIS — I2510 Atherosclerotic heart disease of native coronary artery without angina pectoris: Secondary | ICD-10-CM

## 2021-10-10 MED ORDER — NITROGLYCERIN 0.4 MG SL SUBL: 0.4000 mg | SUBLINGUAL_TABLET | SUBLINGUAL | 3 refills | Status: DC | PRN

## 2021-10-10 MED ORDER — RIVAROXABAN 20 MG PO TABS: 20.0000 mg | ORAL_TABLET | Freq: Every day | ORAL | 11 refills | Status: DC

## 2021-10-10 MED ORDER — ATORVASTATIN CALCIUM 40 MG PO TABS: 40.0000 mg | ORAL_TABLET | Freq: Every day | ORAL | 11 refills | Status: DC

## 2021-10-10 NOTE — Patient Instructions (Addendum)
Medication Instructions:  1.Start Xarelto 20 mg daily 2.Start Lipitor 40 mg daily *If you need a refill on your cardiac medications before your next appointment, please call your pharmacy*   Lab Work: CBC and BMET today Fasting lipid and lft's in 2-3 months at [facility] If you have labs (blood work) drawn today and your tests are completely normal, you will receive your results only by: MyChart Message (if you have MyChart) OR A paper copy in the mail If you have any lab test that is abnormal or we need to change your treatment, we will call you to review the results.  Follow-Up: At [facility], you and your health needs are our priority.  As part of our continuing mission to provide you with exceptional heart care, we have created designated Provider Care Teams.  These Care Teams include your primary Cardiologist (physician) and Advanced Practice Providers (APPs -  Physician Assistants and Nurse Practitioners) who all work together to provide you with the care you need, when you need it.  Your next appointment:   11/07/2021 at 2:45 PM  The format for your next appointment:   In Person  Provider:   Dr Rennis Golden  Important Information About Sugar

## 2021-10-11 LAB — BASIC METABOLIC PANEL
BUN/Creatinine Ratio: 20 (ref 10–24)
BUN: 18 mg/dL (ref 8–27)
CO2: 25 mmol/L (ref 20–29)
Calcium: 9 mg/dL (ref 8.6–10.2)
Chloride: 105 mmol/L (ref 96–106)
Creatinine, Ser: 0.88 mg/dL (ref 0.76–1.27)
Glucose: 102 mg/dL — ABNORMAL HIGH (ref 70–99)
Potassium: 4.4 mmol/L (ref 3.5–5.2)
Sodium: 141 mmol/L (ref 134–144)
eGFR: 90 mL/min/{1.73_m2} (ref >59)

## 2021-10-11 LAB — CBC
Hematocrit: 37.7 % (ref 37.5–51.0)
Hemoglobin: 12.3 g/dL — ABNORMAL LOW (ref 13.0–17.7)
MCH: 30.8 pg (ref 26.6–33.0)
MCHC: 32.6 g/dL (ref 31.5–35.7)
MCV: 94 fL (ref 79–97)
Platelets: 169 10*3/uL (ref 150–450)
RBC: 4 x10E6/uL — ABNORMAL LOW (ref 4.14–5.80)
RDW: 12 % (ref 11.6–15.4)
WBC: 6.6 10*3/uL (ref 3.4–10.8)

## 2021-10-12 NOTE — Addendum Note (Signed)
Addended by: Dennis Bast F on: 10/12/2021 12:52 PM   Modules accepted: Orders

## 2021-11-07 ENCOUNTER — Ambulatory Visit: Payer: No Typology Code available for payment source | Attending: Internal Medicine | Admitting: Internal Medicine

## 2021-11-07 ENCOUNTER — Other Ambulatory Visit: Payer: Self-pay | Admitting: Internal Medicine

## 2021-11-07 ENCOUNTER — Ambulatory Visit (INDEPENDENT_AMBULATORY_CARE_PROVIDER_SITE_OTHER): Payer: No Typology Code available for payment source

## 2021-11-07 ENCOUNTER — Telehealth: Payer: Self-pay | Admitting: Internal Medicine

## 2021-11-07 ENCOUNTER — Encounter: Payer: Self-pay | Admitting: Internal Medicine

## 2021-11-07 VITALS — BP 130/72 | HR 44 | Ht 72.0 in | Wt 248.0 lb

## 2021-11-07 DIAGNOSIS — I2510 Atherosclerotic heart disease of native coronary artery without angina pectoris: Secondary | ICD-10-CM

## 2021-11-07 DIAGNOSIS — I4510 Unspecified right bundle-branch block: Secondary | ICD-10-CM

## 2021-11-07 DIAGNOSIS — R001 Bradycardia, unspecified: Secondary | ICD-10-CM

## 2021-11-07 DIAGNOSIS — I4892 Unspecified atrial flutter: Secondary | ICD-10-CM

## 2021-11-07 DIAGNOSIS — I441 Atrioventricular block, second degree: Secondary | ICD-10-CM

## 2021-11-07 NOTE — Telephone Encounter (Signed)
Pts daughter aware to send proof of income and prescription oop for PAF- Xarelto

## 2021-11-07 NOTE — Patient Instructions (Addendum)
Medication Instructions:  Your physician recommends that you continue on your current medications as directed. Please refer to the Current Medication list given to you today.   Labwork: None  Testing/Procedures: None  Follow-Up: Follow up in 1 month with APP  Any Other Special Instructions Will Be Listed Below (If Applicable).  Xarelto 20 mg Sample #14:  Lot #:[id number] Exp:10/2022   If you need a refill on your cardiac medications before your next appointment, please call your pharmacy.  ZIO XT- Long Term Monitor Instructions   Your physician has requested you wear your ZIO patch monitor__14____days.   This is a single patch monitor.  [employer] supplies one patch monitor per enrollment.  Additional stickers are not available.   Please do not apply patch if you will be having a Nuclear Stress Test, Echocardiogram, Cardiac CT, MRI, or Chest Xray during the time frame you would be wearing the monitor. The patch cannot be worn during these tests.  You cannot remove and re-apply the ZIO XT patch monitor.   Your ZIO patch monitor will be sent USPS Priority mail from [facility] directly to your home address. The monitor may also be mailed to a PO BOX if home delivery is not available.   It may take 3-5 days to receive your monitor after you have been enrolled.   Once you have received you monitor, please review enclosed instructions.  Your monitor has already been registered assigning a specific monitor serial # to you.   Applying the monitor   Shave hair from upper left chest.   Hold abrader disc by orange tab.  Rub abrader in 40 strokes over left upper chest as indicated in your monitor instructions.   Clean area with 4 enclosed alcohol pads .  Use all pads to assure are is cleaned thoroughly.  Let dry.   Apply patch as indicated in monitor instructions.  Patch will be place under collarbone on left side of chest with arrow pointing upward.   Rub patch adhesive wings  for 2 minutes.Remove white label marked "1".  Remove white label marked "2".  Rub patch adhesive wings for 2 additional minutes.   While looking in a mirror, press and release button in center of patch.  A small green light will flash 3-4 times .  This will be your only indicator the monitor has been turned on.     Do not shower for the first 24 hours.  You may shower after the first 24 hours.   Press button if you feel a symptom. You will hear a small click.  Record Date, Time and Symptom in the Patient Log Book.   When you are ready to remove patch, follow instructions on last 2 pages of Patient Log Book.  Stick patch monitor onto last page of Patient Log Book.   Place Patient Log Book in [place] box.  Use locking tab on box and tape box closed securely.  The [employer] and [employer] has [employer] on it.  Please place in mailbox as soon as possible.  Your physician should have your test results approximately 7 days after the monitor has been mailed back to [facility].   Call [place] at [phone] if you have questions regarding your ZIO XT patch monitor.  Call them immediately if you see an orange light blinking on your monitor.   If your monitor falls off in less than 4 days contact our Monitor department at [phone].  If your monitor becomes loose  or falls off after 4 days call [employer] at [phone] for suggestions on securing your monitor.

## 2021-11-07 NOTE — Progress Notes (Signed)
OFFICE NOTE  Chief Complaint:  Hospital follow-up  Primary Care Physician: [facility]  HPI:  Mario Massey is a 76 y.o. male with a past medial history significant for probable sepsis in May 2023, who I saw in the hospital due to elevated troponin concerning for NSTEMI.  Past medical history includes coronary artery disease per chart, hypertension, dyslipidemia and bipolar disorder.  He was admitted to [facility] for worsening nausea vomiting and hematuria.  He was noted to be in possible atrial flutter with a new right bundle branch block upon admission, but is also had some Wenckebach.  Troponin was initially mild elevated at 110 but rose up to 2362.  Is not felt to be a catheterization candidate at that time during the hospitalization and medical therapy was recommended.  Subsequently was discharged.  He was seen in the emergency department again in May 2023 for chest pain.  The work-up at that time was negative.  He was advised to follow-up with cardiology because he had not yet followed up with [place] from his original hospitalization.  11/07/2021  [name] is seen today in follow-up.  He is accompanied by his daughter.  He was previously seen and referred for cardiac catheterization due to concern for NSTEMI.  He was found to have widely patent coronary arteries with a distal LAD stenosis which was thought best medically managed.  He was noted to be in atrial flutter.  Subsequently he has been anticoagulated with the plan to return for possible cardioversion.  He was seen by Estella Husk, PA-C who noted that he was not necessarily compliant with Eliquis and switched him to Xarelto.  He seems to have been note doing better with this.  Today on follow-up he has been noting more fatigue and shortness of breath.  EKG was performed due to bradycardia and he was not noted to be in atrial flutter, however he is in a sinus bradycardia with a Mobitz 1 AV block.  There is underlying LVH  and right bundle branch block suggestive of significant conduction delay with a rate of 45.  He is on no AV nodal blocking medications.  PMHx:  Past Medical History:  Diagnosis Date   Ascending aorta dilation (HCC)    Atrial flutter (HCC)    AV dissociation    Bipolar disorder (Mario Massey)    Coronary artery disease    Depression    Essential hypertension    Hyperlipidemia LDL goal <70    Hypertension    RBBB    Second degree AV block, Mobitz type I     Past Surgical History:  Procedure Laterality Date   KNEE SURGERY     with replacement on right knee   LEFT HEART CATH AND CORONARY ANGIOGRAPHY N/A 07/18/2021   Procedure: LEFT HEART CATH AND CORONARY ANGIOGRAPHY;  Surgeon: Mario Mocha, MD;  Location: [facility];  Service: Cardiovascular;  Laterality: N/A;    FAMHx:  History reviewed. No pertinent family history.  SOCHx:   reports that he has never smoked. He has never used smokeless tobacco. He reports current alcohol use. He reports that he does not use drugs.  ALLERGIES:  No Known Allergies  ROS: Pertinent items noted in HPI and remainder of comprehensive ROS otherwise negative.  HOME MEDS: Current Outpatient Medications on File Prior to Visit  Medication Sig Dispense Refill   atorvastatin (LIPITOR) 40 MG tablet Take 1 tablet (40 mg total) by mouth daily. 30 tablet 11   bacitracin ointment  Apply topically 2 (two) times daily. (Patient taking differently: Apply topically as needed.) 120 g 0   lisinopril (ZESTRIL) 5 MG tablet Take 1 tablet (5 mg total) by mouth daily. 30 tablet 0   nitroGLYCERIN (NITROSTAT) 0.4 MG SL tablet Place 1 tablet (0.4 mg total) under the tongue every 5 (five) minutes as needed for chest pain. 25 tablet 3   rivaroxaban (XARELTO) 20 MG TABS tablet Take 1 tablet (20 mg total) by mouth daily with supper. 30 tablet 11   venlafaxine XR (EFFEXOR-XR) 150 MG 24 hr capsule Take 1 capsule (150 mg total) by mouth daily. 30 capsule 0   ziprasidone  (GEODON) 20 MG capsule Take 1 capsule (20 mg total) by mouth daily. 30 capsule 0   No current facility-administered medications on file prior to visit.    LABS/IMAGING: No results found for this or any previous visit (from the past 48 hour(s)). No results found.  LIPID PANEL: No results found for: "CHOL", "TRIG", "HDL", "CHOLHDL", "VLDL", "LDLCALC", "LDLDIRECT"   WEIGHTS: Wt Readings from Last 3 Encounters:  11/07/21 248 lb (112.5 kg)  10/10/21 245 lb (111.1 kg)  08/23/21 244 lb 11.4 oz (111 kg)    VITALS: BP 130/72   Pulse (!) 44   Ht 6' (1.829 m)   Wt 248 lb (112.5 kg)   SpO2 94%   BMI 33.63 kg/m   EXAM: General appearance: alert and no distress Neck: no carotid bruit, no JVD, and thyroid not enlarged, symmetric, no tenderness/mass/nodules Lungs: clear to auscultation bilaterally Heart: regular rate and rhythm, S1, S2 normal, no murmur, click, rub or gallop Abdomen: soft, non-tender; bowel sounds normal; no masses,  no organomegaly Extremities: extremities normal, atraumatic, no cyanosis or edema Pulses: 2+ and symmetric Skin: Skin color, texture, turgor normal. No rashes or lesions Neurologic: Grossly normal Psych: Pleasant  EKG: Sinus rhythm with second-degree type I AV block, right bundle branch block, LVH by voltage at 45-personally reviewed  ASSESSMENT: NSTEMI (03/2021), in the setting of sepsis, cath in 06/2021 showed 75% distal LAD stenosis - medical therapy recommended Atrial flutter - issues with anticoagulation on Eliquis, now on Xarelto Second-degree type I AV block/right bundle branch block Recurrent chest pain History of coronary disease Hypertension Dyslipidemia   PLAN: 1.   Mr. Corp was found to have minimal distal LAD coronary disease but had atrial flutter.  He has done well on Xarelto better than Eliquis for anticoagulation and at some point converted out of atrial flutter but is in a marked sinus bradycardia today with a second-degree type I  AV block, right bundle branch block and signs of underlying conduction delay.  He has reported worsening fatigue which may be related to this.  I do not think this necessarily indicates a need for pacemaker at this point, however I think the risk is high.  I would like to fit him with a 2-week ZIO monitor to see if he has any further high degree AV block or signs of a tacky bradycardia syndrome which might necessitate pacemaker placement.  Plan follow-up with me afterwards.  Pixie Casino, MD, [facility], [place] Director of the Advanced Lipid Disorders &  Cardiovascular Risk Reduction Clinic Diplomate of the American Board of Clinical Lipidology Attending Cardiologist  Direct Dial: [phone]  Fax: [phone]  Website:  [email or web address] 11/07/2021, 3:03 PM

## 2021-11-07 NOTE — Telephone Encounter (Signed)
Pt's daughter is requesting to speak to Caryl Pina in regards to paperwork from today visit. Transferred to Christella Scheuermann CMA

## 2021-11-13 ENCOUNTER — Encounter: Payer: Self-pay | Admitting: Urology

## 2021-11-13 ENCOUNTER — Ambulatory Visit (INDEPENDENT_AMBULATORY_CARE_PROVIDER_SITE_OTHER): Payer: No Typology Code available for payment source | Admitting: Urology

## 2021-11-13 VITALS — BP 155/67 | HR 64 | Ht 72.0 in | Wt 248.0 lb

## 2021-11-13 DIAGNOSIS — R339 Retention of urine, unspecified: Secondary | ICD-10-CM | POA: Diagnosis not present

## 2021-11-13 DIAGNOSIS — R8290 Unspecified abnormal findings in urine: Secondary | ICD-10-CM

## 2021-11-13 LAB — URINALYSIS, ROUTINE W REFLEX MICROSCOPIC
Bilirubin, UA: NEGATIVE
Glucose, UA: NEGATIVE
Ketones, UA: NEGATIVE
Nitrite, UA: NEGATIVE
Specific Gravity, UA: 1.015 (ref 1.005–1.030)
Urobilinogen, Ur: 0.2 mg/dL (ref 0.2–1.0)
pH, UA: 5.5 (ref 5.0–7.5)

## 2021-11-13 LAB — MICROSCOPIC EXAMINATION: WBC, UA: >30 /hpf — AB (ref 0–5)

## 2021-11-13 NOTE — Addendum Note (Signed)
Addended by: Christella Scheuermann C on: 11/13/2021 11:17 AM   Modules accepted: Orders

## 2021-11-13 NOTE — Progress Notes (Signed)
Cath Change/ Replacement  Patient is present today for a catheter change due to urinary retention.  44ml of water was removed from the balloon, a 18FR foley cath was removed without difficulty.  Patient was cleaned and prepped in a sterile fashion with betadine. A 18 FR foley cath was replaced into the bladder, no complications were noted. Urine return was noted 13ml and urine was yellow in color. The balloon was filled with 37ml of sterile water. A leg bag was attached for drainage.  Patient was given proper instruction on catheter care.    Performed by: Levi Aland, CMA  Follow up: urine sent for ua, MD to see after.

## 2021-11-13 NOTE — Progress Notes (Signed)
Assessment: 1. Urinary retention   2. Abnormal urine findings     Plan: Continue Foley catheter - catheter changed today.  Urine culture sent today for documentation purposes. Recommend further evaluation with urodynamics given his history of chronic urinary retention and minimal prostate enlargement on cystoscopy. Referral for urodynamics placed today. Return to office in 1 month for catheter change.  Chief Complaint:  Chief Complaint  Patient presents with   Urinary Retention    History of Present Illness:  Mario Massey is a 76 y.o. year old male who is seen for further evaluation of urinary retention and chronic Foley catheter.  He apparently has had a Foley catheter since March 2021.  He was previously followed by urology at the [facility] clinic in [place].  No records available for review.  Patient is unable to give any history of prior urologic evaluation with cystoscopy or urodynamics.  He does not think he has had a prior voiding trial.  He has been undergoing catheter changes monthly.  His catheter was changed by home health care on May 15.  He no longer has home health care services. He has had intermittent problems with catheter associated UTIs.  He was apparently admitted for sepsis secondary to a UTI in March 2023.  No recent fevers, chills, or gross hematuria.  Urine culture results: 7/22 >100 K Enterococcus, Pseudomonas 8/22 >100 K Pseudomonas 10/22 >100 K E. Coli 3/23 <10K colonies  CT abdomen and pelvis from 3/23 demonstrated normal kidneys, Foley catheter in decompressed bladder and a 8 mm calculus within the bladder. Renal ultrasound from 3/23 showed no renal mass or hydronephrosis and a decompressed bladder.  His foley was changed on 08/14/21. Urine culture from 08/14/21 grew >100K Staph and 25-50K Pseudomonas.  Patient started on doxycycline prior to visit today. Cystoscopy from 09/11/2021 showed mild lateral lobe enlargement, no median lobe, bladder  diverticulum.  He returns today for a Foley catheter change.  His catheter has been draining well.  No gross hematuria.  No fevers or chills.  He has not been scheduled for urodynamics as discussed at his last visit.  Portions of the above documentation were copied from a prior visit for review purposes only.    Past Medical History:  Past Medical History:  Diagnosis Date   Ascending aorta dilation (HCC)    Atrial flutter (HCC)    AV dissociation    Bipolar disorder (HCC)    Coronary artery disease    Depression    Essential hypertension    Hyperlipidemia LDL goal <70    Hypertension    RBBB    Second degree AV block, Mobitz type I     Past Surgical History:  Past Surgical History:  Procedure Laterality Date   KNEE SURGERY     with replacement on right knee   LEFT HEART CATH AND CORONARY ANGIOGRAPHY N/A 07/18/2021   Procedure: LEFT HEART CATH AND CORONARY ANGIOGRAPHY;  Surgeon: Tonny Bollman, MD;  Location: [facility] INVASIVE CV LAB;  Service: Cardiovascular;  Laterality: N/A;    Allergies:  No Known Allergies  Family History:  No family history on file.  Social History:  Social History   Tobacco Use   Smoking status: Never   Smokeless tobacco: Never  Vaping Use   Vaping Use: Never used  Substance Use Topics   Alcohol use: Yes    Comment: occasional   Drug use: No    ROS: Constitutional:  Negative for fever, chills, weight loss CV: Negative for chest pain,  previous MI, hypertension Respiratory:  Negative for shortness of breath, wheezing, sleep apnea, frequent cough GI:  Negative for nausea, vomiting, bloody stool, GERD   Physical exam: BP (!) 155/67   Pulse 64   Ht 6' (1.829 m)   Wt 248 lb (112.5 kg)   BMI 33.63 kg/m  GENERAL APPEARANCE:  Well appearing, well developed, well nourished, NAD HEENT:  Atraumatic, normocephalic, oropharynx clear NECK:  Supple without lymphadenopathy or thyromegaly ABDOMEN:  Soft, non-tender, no masses EXTREMITIES:  Moves  all extremities well, without clubbing, cyanosis, or edema NEUROLOGIC:  Alert and oriented x 3, CN II-XII grossly intact MENTAL STATUS:  appropriate BACK:  Non-tender to palpation, No CVAT SKIN:  Warm, dry, and intact GU:  foley draining clear urine  Results: U/A: >30 WBCs, 3-10 RBCs, moderate bacteria, nitrite negative

## 2021-11-18 LAB — URINE CULTURE

## 2021-11-18 MED ORDER — CEFDINIR 300 MG PO CAPS: 300.0000 mg | ORAL_CAPSULE | Freq: Two times a day (BID) | ORAL | 0 refills | Status: DC

## 2021-11-18 NOTE — Addendum Note (Signed)
Addended by: Primus Bravo on: 11/18/2021 10:05 PM   Modules accepted: Orders

## 2021-11-20 ENCOUNTER — Telehealth: Payer: Self-pay

## 2021-11-20 NOTE — Telephone Encounter (Signed)
Daughter aware.

## 2021-11-20 NOTE — Telephone Encounter (Signed)
Daughter informed of MD response to culture.  The daughter states she has not heard form anyone in regards to the urodynamics.  Do you have an update on the referral?

## 2021-11-20 NOTE — Telephone Encounter (Signed)
-----   Message from Primus Bravo, MD sent at 11/18/2021 10:05 PM EDT ----- Please notify patient's daughter to begin Cefdinir BID beginning 1 week prior to appointment for urodynamics

## 2021-11-30 ENCOUNTER — Telehealth: Payer: Self-pay | Admitting: Internal Medicine

## 2021-11-30 ENCOUNTER — Other Ambulatory Visit: Payer: Self-pay | Admitting: Internal Medicine

## 2021-11-30 ENCOUNTER — Encounter: Payer: Self-pay | Admitting: Internal Medicine

## 2021-11-30 ENCOUNTER — Telehealth: Payer: Self-pay | Admitting: *Deleted

## 2021-11-30 DIAGNOSIS — R001 Bradycardia, unspecified: Secondary | ICD-10-CM

## 2021-11-30 DIAGNOSIS — I442 Atrioventricular block, complete: Secondary | ICD-10-CM

## 2021-11-30 DIAGNOSIS — I4892 Unspecified atrial flutter: Secondary | ICD-10-CM

## 2021-11-30 NOTE — Telephone Encounter (Signed)
Jocelyn with [employer] calling with critical zio monitor results

## 2021-11-30 NOTE — Progress Notes (Signed)
Dr.Hilty messaged L.Pinnix,LPN and reported he would review monitor and call patient.

## 2021-11-30 NOTE — Telephone Encounter (Signed)
Received a call from [facility] stating that pt's monitor showed Complete heart block, with 2272 episodes and lowest heart rate at 27 BPM. Will upload monitor with available. Please advise.

## 2021-11-30 NOTE — Telephone Encounter (Signed)
Critical monitor scanned and sent to Dr. Debara Pickett and Dr. Harl Bowie (DOD).

## 2021-12-11 ENCOUNTER — Other Ambulatory Visit (HOSPITAL_COMMUNITY)
Admission: RE | Admit: 2021-12-11 | Discharge: 2021-12-11 | Disposition: A | Discharge status: Home / Self Care | Payer: Self-pay | Source: Ambulatory Visit | Attending: Internal Medicine | Admitting: Internal Medicine

## 2021-12-11 ENCOUNTER — Ambulatory Visit: Payer: No Typology Code available for payment source | Attending: Internal Medicine | Admitting: Internal Medicine

## 2021-12-11 ENCOUNTER — Encounter: Payer: Self-pay | Admitting: Internal Medicine

## 2021-12-11 VITALS — BP 128/84 | HR 98 | Ht 72.0 in | Wt 249.2 lb

## 2021-12-11 DIAGNOSIS — I442 Atrioventricular block, complete: Secondary | ICD-10-CM | POA: Insufficient documentation

## 2021-12-11 LAB — CBC
HCT: 38.2 % — ABNORMAL LOW (ref 39.0–52.0)
Hemoglobin: 12.5 g/dL — ABNORMAL LOW (ref 13.0–17.0)
MCH: 30 pg (ref 26.0–34.0)
MCHC: 32.7 g/dL (ref 30.0–36.0)
MCV: 91.8 fL (ref 80.0–100.0)
Platelets: 155 10*3/uL (ref 150–400)
RBC: 4.16 MIL/uL — ABNORMAL LOW (ref 4.22–5.81)
RDW: 13.4 % (ref 11.5–15.5)
WBC: 7.2 10*3/uL (ref 4.0–10.5)
nRBC: 0 % (ref 0.0–0.2)

## 2021-12-11 LAB — BASIC METABOLIC PANEL
Anion gap: 6 (ref 5–15)
BUN: 19 mg/dL (ref 8–23)
CO2: 27 mmol/L (ref 22–32)
Calcium: 8.9 mg/dL (ref 8.9–10.3)
Chloride: 107 mmol/L (ref 98–111)
Creatinine, Ser: 0.79 mg/dL (ref 0.61–1.24)
GFR, Estimated: >60 mL/min (ref >60)
Glucose, Bld: 90 mg/dL (ref 70–99)
Potassium: 4.2 mmol/L (ref 3.5–5.1)
Sodium: 140 mmol/L (ref 135–145)

## 2021-12-11 NOTE — H&P (View-Only) (Signed)
HPI [name] is referred by Dr. Rennis Golden for evaluation of PPM insertion. He is a pleasant 76 yo man with PAFlutter and intermittent CHB. He also has mild dementia. He denies a h/o syncope. No chest pain or sob. He has periods of weakness which I suspect is due to his bradycardia. He wore  a cardiac monitor which showed intermittent CHB in the setting of both NSR and atypical atrial flutter.  No Known Allergies   Current Outpatient Medications  Medication Sig Dispense Refill   atorvastatin (LIPITOR) 40 MG tablet Take 1 tablet (40 mg total) by mouth daily. 30 tablet 11   bacitracin ointment Apply topically 2 (two) times daily. (Patient taking differently: Apply topically as needed.) 120 g 0   lisinopril (ZESTRIL) 5 MG tablet Take 1 tablet (5 mg total) by mouth daily. 30 tablet 0   nitroGLYCERIN (NITROSTAT) 0.4 MG SL tablet Place 1 tablet (0.4 mg total) under the tongue every 5 (five) minutes as needed for chest pain. 25 tablet 3   rivaroxaban (XARELTO) 20 MG TABS tablet Take 1 tablet (20 mg total) by mouth daily with supper. 30 tablet 11   venlafaxine XR (EFFEXOR-XR) 150 MG 24 hr capsule Take 1 capsule (150 mg total) by mouth daily. 30 capsule 0   ziprasidone (GEODON) 20 MG capsule Take 1 capsule (20 mg total) by mouth daily. 30 capsule 0   No current facility-administered medications for this visit.     Past Medical History:  Diagnosis Date   Ascending aorta dilation (HCC)    Atrial flutter (HCC)    AV dissociation    Bipolar disorder (HCC)    Coronary artery disease    Depression    Essential hypertension    Hyperlipidemia LDL goal <70    Hypertension    RBBB    Second degree AV block, Mobitz type I     ROS:   All systems reviewed and negative except as noted in the HPI.   Past Surgical History:  Procedure Laterality Date   KNEE SURGERY     with replacement on right knee   LEFT HEART CATH AND CORONARY ANGIOGRAPHY N/A 07/18/2021   Procedure: LEFT HEART CATH AND  CORONARY ANGIOGRAPHY;  Surgeon: Tonny Bollman, MD;  Location: [facility] INVASIVE CV LAB;  Service: Cardiovascular;  Laterality: N/A;     No family history on file.   Social History   Socioeconomic History   Marital status: Divorced    Spouse name: Not on file   Number of children: Not on file   Years of education: Not on file   Highest education level: Not on file  Occupational History   Not on file  Tobacco Use   Smoking status: Never    Passive exposure: Never   Smokeless tobacco: Never  Vaping Use   Vaping Use: Never used  Substance and Sexual Activity   Alcohol use: Yes    Comment: occasional   Drug use: No   Sexual activity: Not on file  Other Topics Concern   Not on file  Social History Narrative   Not on file   Social Determinants of Health   Financial Resource Strain: Not on file  Food Insecurity: Not on file  Transportation Needs: Not on file  Physical Activity: Not on file  Stress: Not on file  Social Connections: Not on file  Intimate Partner Violence: Not on file     BP 128/84 (BP Location: Left Arm, Patient Position: Sitting, Cuff Size:  Large)   Pulse 98   Ht 6' (1.829 m)   Wt 249 lb 3.2 oz (113 kg)   BMI 33.80 kg/m   Physical Exam:  Well appearing NAD HEENT: Unremarkable Neck:  No JVD, no thyromegally Lymphatics:  No adenopathy Back:  No CVA tenderness Lungs:  Clear with no wheezes HEART:  Regular rate rhythm, no murmurs, no rubs, no clicks Abd:  soft, positive bowel sounds, no organomegally, no rebound, no guarding Ext:  2 plus pulses, no edema, no cyanosis, no clubbing Skin:  No rashes no nodules Neuro:  CN II through XII intact, motor grossly intact  EKG - reviewed. NSR with RBBB   Assess/Plan: CHB - I have discussed the treatment options with the patient and his wife and they wish to proceed. 2. PAF - he is in and out of rhythm.  Connery Shiffler,MD 

## 2021-12-11 NOTE — Progress Notes (Signed)
HPI Mario Massey is referred by Dr. Rennis Golden for evaluation of PPM insertion. He is a pleasant 76 yo man with PAFlutter and intermittent CHB. He also has mild dementia. He denies a h/o syncope. No chest pain or sob. He has periods of weakness which I suspect is due to his bradycardia. He wore  a cardiac monitor which showed intermittent CHB in the setting of both NSR and atypical atrial flutter.  No Known Allergies   Current Outpatient Medications  Medication Sig Dispense Refill   atorvastatin (LIPITOR) 40 MG tablet Take 1 tablet (40 mg total) by mouth daily. 30 tablet 11   bacitracin ointment Apply topically 2 (two) times daily. (Patient taking differently: Apply topically as needed.) 120 g 0   lisinopril (ZESTRIL) 5 MG tablet Take 1 tablet (5 mg total) by mouth daily. 30 tablet 0   nitroGLYCERIN (NITROSTAT) 0.4 MG SL tablet Place 1 tablet (0.4 mg total) under the tongue every 5 (five) minutes as needed for chest pain. 25 tablet 3   rivaroxaban (XARELTO) 20 MG TABS tablet Take 1 tablet (20 mg total) by mouth daily with supper. 30 tablet 11   venlafaxine XR (EFFEXOR-XR) 150 MG 24 hr capsule Take 1 capsule (150 mg total) by mouth daily. 30 capsule 0   ziprasidone (GEODON) 20 MG capsule Take 1 capsule (20 mg total) by mouth daily. 30 capsule 0   No current facility-administered medications for this visit.     Past Medical History:  Diagnosis Date   Ascending aorta dilation (HCC)    Atrial flutter (HCC)    AV dissociation    Bipolar disorder (HCC)    Coronary artery disease    Depression    Essential hypertension    Hyperlipidemia LDL goal <70    Hypertension    RBBB    Second degree AV block, Mobitz type I     ROS:   All systems reviewed and negative except as noted in the HPI.   Past Surgical History:  Procedure Laterality Date   KNEE SURGERY     with replacement on right knee   LEFT HEART CATH AND CORONARY ANGIOGRAPHY N/A 07/18/2021   Procedure: LEFT HEART CATH AND  CORONARY ANGIOGRAPHY;  Surgeon: Tonny Bollman, MD;  Location: [facility] INVASIVE CV LAB;  Service: Cardiovascular;  Laterality: N/A;     No family history on file.   Social History   Socioeconomic History   Marital status: Divorced    Spouse name: Not on file   Number of children: Not on file   Years of education: Not on file   Highest education level: Not on file  Occupational History   Not on file  Tobacco Use   Smoking status: Never    Passive exposure: Never   Smokeless tobacco: Never  Vaping Use   Vaping Use: Never used  Substance and Sexual Activity   Alcohol use: Yes    Comment: occasional   Drug use: No   Sexual activity: Not on file  Other Topics Concern   Not on file  Social History Narrative   Not on file   Social Determinants of Health   Financial Resource Strain: Not on file  Food Insecurity: Not on file  Transportation Needs: Not on file  Physical Activity: Not on file  Stress: Not on file  Social Connections: Not on file  Intimate Partner Violence: Not on file     BP 128/84 (BP Location: Left Arm, Patient Position: Sitting, Cuff Size:  Large)   Pulse 98   Ht 6' (1.829 m)   Wt 249 lb 3.2 oz (113 kg)   BMI 33.80 kg/m   Physical Exam:  Well appearing NAD HEENT: Unremarkable Neck:  No JVD, no thyromegally Lymphatics:  No adenopathy Back:  No CVA tenderness Lungs:  Clear with no wheezes HEART:  Regular rate rhythm, no murmurs, no rubs, no clicks Abd:  soft, positive bowel sounds, no organomegally, no rebound, no guarding Ext:  2 plus pulses, no edema, no cyanosis, no clubbing Skin:  No rashes no nodules Neuro:  CN II through XII intact, motor grossly intact  EKG - reviewed. NSR with RBBB   Assess/Plan: CHB - I have discussed the treatment options with the patient and his wife and they wish to proceed. 2. PAF - he is in and out of rhythm.  Carleene Overlie Jaiya Mooradian,MD

## 2021-12-11 NOTE — Patient Instructions (Signed)
Medication Instructions:  Your physician recommends that you continue on your current medications as directed. Please refer to the Current Medication list given to you today.  *If you need a refill on your cardiac medications before your next appointment, please call your pharmacy*   Lab Work: Your physician recommends that you return for lab work in: Today   If you have labs (blood work) drawn today and your tests are completely normal, you will receive your results only by: MyChart Message (if you have MyChart) OR A paper copy in the mail If you have any lab test that is abnormal or we need to change your treatment, we will call you to review the results.   Testing/Procedures: Your physician has recommended that you have a pacemaker inserted. A pacemaker is a small device that is placed under the skin of your chest or abdomen to help control abnormal heart rhythms. This device uses electrical pulses to prompt the heart to beat at a normal rate. Pacemakers are used to treat heart rhythms that are too slow. Wire (leads) are attached to the pacemaker that goes into the chambers of you heart. This is done in the hospital and usually requires and overnight stay. Please see the instruction sheet given to you today for more information.    Follow-Up: At [facility], you and your health needs are our priority.  As part of our continuing mission to provide you with exceptional heart care, we have created designated Provider Care Teams.  These Care Teams include your primary Cardiologist (physician) and Advanced Practice Providers (APPs -  Physician Assistants and Nurse Practitioners) who all work together to provide you with the care you need, when you need it.  We recommend signing up for the patient portal called "MyChart".  Sign up information is provided on this After Visit Summary.  MyChart is used to connect with patients for Virtual Visits (Telemedicine).  Patients are able to view  lab/test results, encounter notes, upcoming appointments, etc.  Non-urgent messages can be sent to your provider as well.   To learn more about what you can do with MyChart, go to [email or web address].    Your next appointment:    14 Days after pacemaker placement in [facility] 91 Days after pacemaker placement in [place]   The format for your next appointment:   In Person  Provider:   Lewayne Bunting, MD    Other Instructions Thank you for choosing [facility]!        Implantable Device Instructions    Mario Massey  12/11/2021  You are scheduled for a Permanent transvenous pacemaker (PPM) on Monday, December 4 with Dr. Lewayne Bunting.  1. Pre procedure Lab testing:  [facility]     2. Please arrive at the Main Entrance A at [facility]: [address] [place], [place] [address] on December 4 at 5:30 AM (This time is two hours before your procedure to ensure your preparation). Free valet parking service is available. You will check in at ADMITTING. The support person will be asked to wait in the waiting room.  It is OK to have someone drop you off and come back when you are ready to be discharged.        Special note: Every effort is made to have your procedure done on time. Please understand that emergencies sometimes delay  scheduled procedures.  3.  No eating or drinking after midnight prior to procedure.     4.  Medication  instructions:  On the morning of your procedure hold your Xarelto (Rivaroxaban) for 2 day(s) prior to your procedure. Your last dose will be Friday, December 1, PM dose.     5.  The night before your procedure and the morning of your procedure scrub your neck/chest with CHG surgical scrub.  See instruction letter.  6. Plan to go home the same day, you will only stay overnight if medically necessary. 7. You MUST have a responsible adult to drive you home. 8. An adult MUST be with you the first 24 hours after you arrive  home. 9. Bring a current list of your medications, and the last time and date medication taken. 10. Bring ID and current insurance cards. 11. Please wear clothes that are easy to get on and off and wear slip-on shoes.    You will follow up with the [facility] Device clinic 10-14 days after your procedure.  You will follow up with Dr. Lewayne Bunting 91 days after your procedure.  These appointments will be made for you.   * If you have ANY questions after you get home, please call the office at [phone] or send a MyChart message.  FYI: For your safety, and to allow [place] to monitor your vital signs accurately during the surgery/procedure we request that if you have artificial nails, gel coating, SNS etc. Please have those removed prior to your surgery/procedure. Not having the nail coverings /polish removed may result in cancellation or delay of your surgery/procedure.    [place] - Preparing For Surgery    Before surgery, you can play an important role. Because skin is not sterile, your skin needs to be as free of germs as possible. You can reduce the number of germs on your skin by washing with CHG (chlorahexidine gluconate) Soap before surgery.  CHG is an antiseptic cleaner which kills germs and bonds with the skin to continue killing germs even after washing.  Please do not use if you have an allergy to CHG or antibacterial soaps.  If your skin becomes reddened/irritated stop using the CHG.   Do not shave (including legs and underarms) for at least 48 hours prior to first CHG shower.  It is OK to shave your face.  Please follow these instructions carefully:  1.  Shower the night before surgery and the morning of surgery with CHG.  2.  If you choose to wash your hair, wash your hair first as usual with your normal shampoo.  3.  After you shampoo, rinse your hair and body thoroughly to remove the shampoo.  4.  Use CHG as you would any other liquid soap.  You can apply CHG directly to the  skin and wash gently with a clean washcloth. 5.  Apply the CHG Soap to your body ONLY FROM THE NECK DOWN.  Do not use on open wounds or open sores.  Avoid contact with your eyes, ears, mouth and genitals (private parts).  Wash genitals (private parts) with your normal soap.  6.  Wash thoroughly, paying special attention to the area where your surgery will be performed.  7.  Thoroughly rinse your body with warm water from the neck down.   8.  DO NOT shower/wash with your normal soap after using and rinsing off the CHG soap.  9.  Pat yourself dry with a clean towel.           10.  Wear clean pajamas.  11.  Place clean sheets on your bed the night of your first shower and do not sleep with pets.  Day of Surgery: Do not apply any deodorants/lotions.  Please wear clean clothes to the hospital/surgery center.   Important Information About Sugar

## 2021-12-12 ENCOUNTER — Ambulatory Visit: Payer: No Typology Code available for payment source | Admitting: Student

## 2021-12-24 ENCOUNTER — Ambulatory Visit (INDEPENDENT_AMBULATORY_CARE_PROVIDER_SITE_OTHER): Payer: No Typology Code available for payment source | Admitting: Urology

## 2021-12-24 ENCOUNTER — Encounter: Payer: Self-pay | Admitting: Urology

## 2021-12-24 DIAGNOSIS — R339 Retention of urine, unspecified: Secondary | ICD-10-CM

## 2021-12-24 DIAGNOSIS — Z978 Presence of other specified devices: Secondary | ICD-10-CM | POA: Diagnosis not present

## 2021-12-24 DIAGNOSIS — R8290 Unspecified abnormal findings in urine: Secondary | ICD-10-CM | POA: Diagnosis not present

## 2021-12-24 MED ORDER — CEFDINIR 300 MG PO CAPS: 300.0000 mg | ORAL_CAPSULE | Freq: Two times a day (BID) | ORAL | 0 refills | Status: AC

## 2021-12-24 NOTE — Progress Notes (Signed)
Assessment: 1. Urinary retention   2. Chronic indwelling Foley catheter   3. Abnormal urine findings     Plan: Continue Foley catheter - catheter changed today.  Recommend further evaluation with urodynamics given his history of chronic urinary retention and minimal prostate enlargement on cystoscopy. Return to office in 1 month for catheter change.  Chief Complaint:  Chief Complaint  Patient presents with   Urinary Retention    History of Present Illness:  Mario Massey is a 76 y.o. year old male who is seen for further evaluation of urinary retention and chronic Foley catheter.  He apparently has had a Foley catheter since March 2021.  He was previously followed by urology at the [facility] clinic in [place].  No records available for review.  Patient is unable to give any history of prior urologic evaluation with cystoscopy or urodynamics.  He does not think he has had a prior voiding trial.  He has been undergoing catheter changes monthly.  His catheter was changed by home health care on May 15.  He no longer has home health care services. He has had intermittent problems with catheter associated UTIs.  He was apparently admitted for sepsis secondary to a UTI in March 2023.  No recent fevers, chills, or gross hematuria.  Urine culture results: 7/22 >100 K Enterococcus, Pseudomonas 8/22 >100 K Pseudomonas 10/22 >100 K E. Coli 3/23 <10K colonies  CT abdomen and pelvis from 3/23 demonstrated normal kidneys, Foley catheter in decompressed bladder and a 8 mm calculus within the bladder. Renal ultrasound from 3/23 showed no renal mass or hydronephrosis and a decompressed bladder.  His foley was changed on 08/14/21. Urine culture from 08/14/21 grew >100K Staph and 25-50K Pseudomonas.  Patient started on doxycycline prior to visit today. Cystoscopy from 09/11/2021 showed mild lateral lobe enlargement, no median lobe, bladder diverticulum. His Foley catheter was changed on 11/15/2021.   Urine culture at that time grew greater than 100 K E. coli. He was referred again for evaluation with urodynamics.  He returns today for a Foley catheter change.  No gross hematuria, fevers, or chills.  He is still awaiting urodynamic evaluation pending approval by the [place].  Portions of the above documentation were copied from a prior visit for review purposes only.  Past Medical History:  Past Medical History:  Diagnosis Date   Ascending aorta dilation (HCC)    Atrial flutter (HCC)    AV dissociation    Bipolar disorder (HCC)    Coronary artery disease    Depression    Essential hypertension    Hyperlipidemia LDL goal <70    Hypertension    RBBB    Second degree AV block, Mobitz type I     Past Surgical History:  Past Surgical History:  Procedure Laterality Date   KNEE SURGERY     with replacement on right knee   LEFT HEART CATH AND CORONARY ANGIOGRAPHY N/A 07/18/2021   Procedure: LEFT HEART CATH AND CORONARY ANGIOGRAPHY;  Surgeon: Tonny Bollman, MD;  Location: [facility] INVASIVE CV LAB;  Service: Cardiovascular;  Laterality: N/A;    Allergies:  No Known Allergies  Family History:  No family history on file.  Social History:  Social History   Tobacco Use   Smoking status: Never    Passive exposure: Never   Smokeless tobacco: Never  Vaping Use   Vaping Use: Never used  Substance Use Topics   Alcohol use: Yes    Comment: occasional   Drug use: No  ROS: Constitutional:  Negative for fever, chills, weight loss CV: Negative for chest pain, previous MI, hypertension Respiratory:  Negative for shortness of breath, wheezing, sleep apnea, frequent cough GI:  Negative for nausea, vomiting, bloody stool, GERD  Physical exam: GENERAL APPEARANCE:  Well appearing, well developed, well nourished, NAD HEENT:  Atraumatic, normocephalic, oropharynx clear NECK:  Supple without lymphadenopathy or thyromegaly ABDOMEN:  Soft, non-tender, no masses EXTREMITIES:  Moves all  extremities well, without clubbing, cyanosis, or edema NEUROLOGIC:  Alert and oriented x 3,  CN II-XII grossly intact MENTAL STATUS:  appropriate BACK:  Non-tender to palpation, No CVAT SKIN:  Warm, dry, and intact GU:  foley draining clear urine  Results: None

## 2021-12-24 NOTE — Progress Notes (Signed)
Cath Change/ Replacement  Patient is present today for a catheter change due to urinary retention.  6ml of water was removed from the balloon, a 18FR foley cath was removed without difficulty.  Patient was cleaned and prepped in a sterile fashion with betadine and 2% lidocaine jelly was instilled into the urethra. A 18 FR foley cath was replaced into the bladder, no complications were noted. Urine return was noted 50ml and urine was yellow in color. The balloon was filled with 58ml of sterile water. A leg bag was attached for drainage.  A night bag was also given to the patient and patient was given instruction on how to change from one bag to another. Patient was given proper instruction on catheter care.    Performed by: Guss Bunde, CMA  Follow up: Follow up as scheduled.

## 2021-12-28 NOTE — Pre-Procedure Instructions (Signed)
Spoke with daughter Farrel Gordon.   Instructed patient on the following items: Arrival time 0530 Nothing to eat or drink after midnight No meds AM of procedure Responsible person to drive you home and stay with you for 24 hrs Wash with special soap night before and morning of procedure If on anti-coagulant drug instructions Coumadin- last dose 12/1

## 2021-12-31 ENCOUNTER — Ambulatory Visit (HOSPITAL_COMMUNITY): Payer: Self-pay

## 2021-12-31 ENCOUNTER — Encounter (HOSPITAL_COMMUNITY)
Admission: RE | Disposition: A | Discharge status: Home / Self Care | Payer: Self-pay | Source: Home / Self Care | Attending: Internal Medicine

## 2021-12-31 ENCOUNTER — Other Ambulatory Visit: Payer: Self-pay

## 2021-12-31 ENCOUNTER — Ambulatory Visit (HOSPITAL_COMMUNITY)
Admission: RE | Admit: 2021-12-31 | Discharge: 2021-12-31 | Disposition: A | Discharge status: Home / Self Care | Payer: Self-pay | Attending: Internal Medicine | Admitting: Internal Medicine

## 2021-12-31 DIAGNOSIS — I442 Atrioventricular block, complete: Secondary | ICD-10-CM | POA: Insufficient documentation

## 2021-12-31 DIAGNOSIS — I484 Atypical atrial flutter: Secondary | ICD-10-CM | POA: Insufficient documentation

## 2021-12-31 DIAGNOSIS — I480 Paroxysmal atrial fibrillation: Secondary | ICD-10-CM | POA: Insufficient documentation

## 2021-12-31 DIAGNOSIS — F03A3 Unspecified dementia, mild, with mood disturbance: Secondary | ICD-10-CM | POA: Insufficient documentation

## 2021-12-31 HISTORY — PX: PACEMAKER IMPLANT: EP1218

## 2021-12-31 SURGERY — PACEMAKER IMPLANT

## 2021-12-31 MED ORDER — SODIUM CHLORIDE 0.9 % IV SOLN: INTRAVENOUS | Status: DC

## 2021-12-31 MED ORDER — CEFAZOLIN SODIUM-DEXTROSE 2-4 GM/100ML-% IV SOLN
2.0000 g | INTRAVENOUS | Status: AC
  Administered 2021-12-31: 2 g via INTRAVENOUS

## 2021-12-31 MED ORDER — MIDAZOLAM HCL 5 MG/5ML IJ SOLN
INTRAMUSCULAR | Status: AC
  Filled 2021-12-31: qty 5

## 2021-12-31 MED ORDER — CEFAZOLIN SODIUM-DEXTROSE 1-4 GM/50ML-% IV SOLN
1.0000 g | Freq: Once | INTRAVENOUS | Status: DC
  Filled 2021-12-31: qty 50

## 2021-12-31 MED ORDER — ACETAMINOPHEN 325 MG PO TABS: 325.0000 mg | ORAL_TABLET | ORAL | Status: DC | PRN

## 2021-12-31 MED ORDER — FENTANYL CITRATE (PF) 100 MCG/2ML IJ SOLN
INTRAMUSCULAR | Status: AC
  Filled 2021-12-31: qty 2

## 2021-12-31 MED ORDER — LIDOCAINE HCL (PF) 1 % IJ SOLN
INTRAMUSCULAR | Status: DC | PRN
  Administered 2021-12-31: 60 mL

## 2021-12-31 MED ORDER — POVIDONE-IODINE 10 % EX SWAB
2.0000 | Freq: Once | CUTANEOUS | Status: AC
  Administered 2021-12-31: 2 via TOPICAL

## 2021-12-31 MED ORDER — ONDANSETRON HCL 4 MG/2ML IJ SOLN: 4.0000 mg | Freq: Four times a day (QID) | INTRAMUSCULAR | Status: DC | PRN

## 2021-12-31 MED ORDER — HEPARIN (PORCINE) IN NACL 1000-0.9 UT/500ML-% IV SOLN
INTRAVENOUS | Status: DC | PRN
  Administered 2021-12-31: 500 mL

## 2021-12-31 MED ORDER — SODIUM CHLORIDE 0.9 % IV SOLN
80.0000 mg | INTRAVENOUS | Status: AC
  Administered 2021-12-31: 80 mg

## 2021-12-31 MED ORDER — CEFAZOLIN SODIUM-DEXTROSE 2-4 GM/100ML-% IV SOLN
INTRAVENOUS | Status: AC
  Filled 2021-12-31: qty 100

## 2021-12-31 MED ORDER — SODIUM CHLORIDE 0.9 % IV SOLN
INTRAVENOUS | Status: AC
  Filled 2021-12-31: qty 2

## 2021-12-31 MED ORDER — LIDOCAINE HCL 1 % IJ SOLN
INTRAMUSCULAR | Status: AC
  Filled 2021-12-31: qty 60

## 2021-12-31 MED ORDER — CHLORHEXIDINE GLUCONATE 4 % EX LIQD
4.0000 | Freq: Once | CUTANEOUS | Status: DC
  Filled 2021-12-31: qty 60

## 2021-12-31 SURGICAL SUPPLY — 13 items
CABLE SURGICAL S-101-97-12 (CABLE) ×1 IMPLANT
CATH CPS LOCATOR 3D LG (CATHETERS) IMPLANT
HELIX LOCKING TOOL (MISCELLANEOUS) ×1
LEAD ULTIPACE 52 LPA1231/52 (Lead) IMPLANT
LEAD ULTIPACE 65 LPA1231/65 (Lead) IMPLANT
PACEMAKER ASSURITY DR-RF (Pacemaker) IMPLANT
PAD DEFIB RADIO PHYSIO CONN (PAD) ×1 IMPLANT
SHEATH 7FR PRELUDE SNAP 13 (SHEATH) IMPLANT
SHEATH 9FR PRELUDE SNAP 13 (SHEATH) IMPLANT
SLITTER AGILIS HISPRO (INSTRUMENTS) IMPLANT
TOOL HELIX LOCKING (MISCELLANEOUS) IMPLANT
TRAY PACEMAKER INSERTION (PACKS) ×1 IMPLANT
WIRE HI TORQ VERSACORE-J 145CM (WIRE) IMPLANT

## 2021-12-31 NOTE — Progress Notes (Signed)
Received call from telemetry that client's leads are off; went in room and client had arm sling off, gown off, and sitting up trying to get out of bed; client confused; states," I don't have anything wrong with me, and I need to go home" advised to lie back in bed and client would not; called client's daughter to come in room and client back lying in bed and sling placed back on left arm; daughter in room

## 2021-12-31 NOTE — Discharge Instructions (Addendum)
After Your Pacemaker   You have a Abbott Pacemaker  ACTIVITY Do not lift your arm above shoulder height for 1 week after your procedure. After 7 days, you may progress as below.  You should remove your sling 24 hours after your procedure, unless otherwise instructed by your provider.     Monday January 07, 2022  Tuesday January 08, 2022 Wednesday January 09, 2022 Thursday January 10, 2022   Do not lift, push, pull, or carry anything over 10 pounds with the affected arm until 6 weeks (Monday February 11, 2022 ) after your procedure.   You may drive AFTER your wound check, unless you have been told otherwise by your provider.   Ask your healthcare provider when you can go back to work   INCISION/Dressing Restart your Xarelto on 12/9   If large square, outer bandage is left in place, this can be removed after 24 hours from your procedure. Do not remove steri-strips or glue as below.   Monitor your Pacemaker site for redness, swelling, and drainage. Call the device clinic at [phone] if you experience these symptoms or fever/chills.  If your incision is sealed with Steri-strips or staples, you may shower 7 days after your procedure or when told by your provider. Do not remove the steri-strips or let the shower hit directly on your site. You may wash around your site with soap and water.    If you were discharged in a sling, please do not wear this during the day more than 48 hours after your surgery unless otherwise instructed. This may increase the risk of stiffness and soreness in your shoulder.   Avoid lotions, ointments, or perfumes over your incision until it is well-healed.  You may use a hot tub or a pool AFTER your wound check appointment if the incision is completely closed.  Pacemaker Alerts:  Some alerts are vibratory and others beep. These are NOT emergencies. Please call our office to let us know. If this occurs at night or on weekends, it can wait until the next  business day. Send a remote transmission.  If your device is capable of reading fluid status (for heart failure), you will be offered monthly monitoring to review this with you.   DEVICE MANAGEMENT Remote monitoring is used to monitor your pacemaker from home. This monitoring is scheduled every 91 days by our office. It allows [place] to keep an eye on the functioning of your device to ensure it is working properly. You will routinely see your Electrophysiologist annually (more often if necessary).   You should receive your ID card for your new device in 4-8 weeks. Keep this card with you at all times once received. Consider wearing a medical alert bracelet or necklace.  Your Pacemaker may be MRI compatible. This will be discussed at your next office visit/wound check.  You should avoid contact with strong electric or magnetic fields.   Do not use amateur (ham) radio equipment or electric (arc) welding torches. MP3 player headphones with magnets should not be used. Some devices are safe to use if held at least 12 inches (30 cm) from your Pacemaker. These include power tools, lawn mowers, and speakers. If you are unsure if something is safe to use, ask your health care provider.  When using your cell phone, hold it to the ear that is on the opposite side from the Pacemaker. Do not leave your cell phone in a pocket over the Pacemaker.  You may safely use electric blankets,  heating pads, computers, and microwave ovens.  Call the office right away if: You have chest pain. You feel more short of breath than you have felt before. You feel more light-headed than you have felt before. Your incision starts to open up.  This information is not intended to replace advice given to you by your health care provider. Make sure you discuss any questions you have with your health care provider.

## 2021-12-31 NOTE — Progress Notes (Signed)
Dr Taylor notified of CXR results and ok to d/c home 

## 2021-12-31 NOTE — Interval H&P Note (Signed)
History and Physical Interval Note:  12/31/2021 7:10 AM  Mario Massey  has presented today for surgery, with the diagnosis of heart block.  The various methods of treatment have been discussed with the patient and family. After consideration of risks, benefits and other options for treatment, the patient has consented to  Procedure(s): PACEMAKER IMPLANT (N/A) as a surgical intervention.  The patient's history has been reviewed, patient examined, no change in status, stable for surgery.  I have reviewed the patient's chart and labs.  Questions were answered to the patient's satisfaction.     Lewayne Bunting

## 2022-01-01 ENCOUNTER — Telehealth: Payer: Self-pay

## 2022-01-01 ENCOUNTER — Encounter (HOSPITAL_COMMUNITY): Payer: Self-pay | Admitting: Internal Medicine

## 2022-01-01 NOTE — Telephone Encounter (Signed)
Follow-up after same day discharge: Implant date: 12/31/2021 MD: Lewayne Bunting, MD Device: pacemaker Location: left chest   Wound check visit: 01/16/2022 at 10:00 am 90 day MD follow-up: scheduled  Remote Transmission received:yes  Dressing/sling removed: will remove today  Confirm OAC restart on: 01/05/2022   All questions answered.

## 2022-01-01 NOTE — Telephone Encounter (Signed)
-----   Message from Graciella Freer, [place] sent at 01/01/2022  9:37 AM EST ----- Regarding: FW: Same Day Discharge  ----- Message ----- From: Graciella Freer, PA-C Sent: 12/31/2021   3:52 PM EST To: [facility] Subject: Same Day Discharge                             PPM 12/31/21 Dr. Ladona Ridgel

## 2022-01-11 ENCOUNTER — Telehealth: Payer: Self-pay | Admitting: Internal Medicine

## 2022-01-11 DIAGNOSIS — I4892 Unspecified atrial flutter: Secondary | ICD-10-CM

## 2022-01-11 NOTE — Telephone Encounter (Signed)
Honestly I have never heard that before so I can not explain it.  Maybe because it will be the new year?

## 2022-01-11 NOTE — Telephone Encounter (Signed)
I spoke with Mario Massey at [facility] and he said patients insurance will only allow for 14 day fill of his atorvastatin and xarelto, also states both are free for that quantity. If he were to fill 30 day xarelto-it was over $600.    Can you explain this to me?

## 2022-01-11 NOTE — Telephone Encounter (Signed)
Pt c/o medication issue:  1. Name of Medication: rivaroxaban (XARELTO) 20 MG TABS tablet atorvastatin (LIPITOR) 40 MG tablet  2. How are you currently taking this medication (dosage and times per day)?   3. Are you having a reaction (difficulty breathing--STAT)?   4. What is your medication issue? Patient is almost out of medication, but the daughter states that she needs to speak to someone about the cost of these meds. She states that insurance doesn't cover all of it and she can not keep paying and would like a call back to discuss this. Please advise.

## 2022-01-15 MED ORDER — RIVAROXABAN 20 MG PO TABS: 20.0000 mg | ORAL_TABLET | Freq: Every day | ORAL | 1 refills | Status: DC

## 2022-01-15 MED ORDER — ATORVASTATIN CALCIUM 40 MG PO TABS: 40.0000 mg | ORAL_TABLET | Freq: Every day | ORAL | 3 refills | Status: DC

## 2022-01-15 NOTE — Addendum Note (Signed)
Addended by: Cheree Ditto on: 01/15/2022 04:23 PM   Modules accepted: Orders

## 2022-01-16 ENCOUNTER — Ambulatory Visit: Payer: No Typology Code available for payment source | Attending: Internal Medicine

## 2022-01-16 DIAGNOSIS — I442 Atrioventricular block, complete: Secondary | ICD-10-CM

## 2022-01-16 LAB — CUP PACEART INCLINIC DEVICE CHECK
Battery Remaining Longevity: 74 mo
Battery Voltage: 3.05 V
Brady Statistic RA Percent Paced: 11 %
Brady Statistic RV Percent Paced: 97 %
Date Time Interrogation Session: 20231220141255
Implantable Lead Connection Status: 753985
Implantable Lead Connection Status: 753985
Implantable Lead Implant Date: 20231204
Implantable Lead Implant Date: 20231204
Implantable Lead Location: 753859
Implantable Lead Location: 753860
Implantable Pulse Generator Implant Date: 20231204
Lead Channel Impedance Value: 475 Ohm
Lead Channel Impedance Value: 537.5 Ohm
Lead Channel Pacing Threshold Amplitude: 0.75 V
Lead Channel Pacing Threshold Amplitude: 0.75 V
Lead Channel Pacing Threshold Pulse Width: 0.5 ms
Lead Channel Pacing Threshold Pulse Width: 0.5 ms
Lead Channel Sensing Intrinsic Amplitude: 0.8 mV
Lead Channel Sensing Intrinsic Amplitude: 10 mV
Lead Channel Setting Pacing Amplitude: 3.5 V
Lead Channel Setting Pacing Amplitude: 3.5 V
Lead Channel Setting Pacing Pulse Width: 0.5 ms
Lead Channel Setting Sensing Sensitivity: 0.5 mV
Pulse Gen Model: 2272
Pulse Gen Serial Number: 8111795

## 2022-01-16 NOTE — Progress Notes (Signed)

## 2022-01-16 NOTE — Patient Instructions (Signed)

## 2022-01-18 ENCOUNTER — Ambulatory Visit (INDEPENDENT_AMBULATORY_CARE_PROVIDER_SITE_OTHER): Payer: No Typology Code available for payment source | Admitting: Urology

## 2022-01-18 VITALS — BP 130/70 | HR 70

## 2022-01-18 DIAGNOSIS — Z978 Presence of other specified devices: Secondary | ICD-10-CM

## 2022-01-18 DIAGNOSIS — R339 Retention of urine, unspecified: Secondary | ICD-10-CM

## 2022-01-18 DIAGNOSIS — N401 Enlarged prostate with lower urinary tract symptoms: Secondary | ICD-10-CM | POA: Diagnosis not present

## 2022-01-18 NOTE — Progress Notes (Signed)
Cath Change/ Replacement  Patient is present today for a catheter change due to urinary retention.  56ml of water was removed from the balloon, a 18FR foley cath was removed without difficulty.  Patient was cleaned and prepped in a sterile fashion with betadine and 2% lidocaine jelly was instilled into the urethra. A 18 FR foley cath was replaced into the bladder, no complications were noted. Urine return was noted 76ml and urine was yellow in color. The balloon was filled with 23ml of sterile water. A leg bag was attached for drainage.  A night bag was also given to the patient and patient was given instruction on how to change from one bag to another. Patient was given proper instruction on catheter care.    Performed by: PHXTAVWP CMA  Follow up: Keep next cath change

## 2022-01-18 NOTE — Progress Notes (Signed)
01/18/2022 10:20 AM   Mario Massey 1946-01-06 [id number]  Referring provider: Clinic, Mario Massey 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 He has had an indwelling foley for several years. He underwent cystoscopy which showed a smaller gland without significant obstruction. He has never had urodynamics. He has intermittent urinary urgency with the foley in place. No other complaints today.    PMH: Past Medical History:  Diagnosis Date   Ascending aorta dilation (HCC)    Atrial flutter (HCC)    AV dissociation    Bipolar disorder (HCC)    Coronary artery disease    Depression    Essential hypertension    Hyperlipidemia LDL goal <70    Hypertension    RBBB    Second degree AV block, Mobitz type I     Surgical History: Past Surgical History:  Procedure Laterality Date   KNEE SURGERY     with replacement on right knee   LEFT HEART CATH AND CORONARY ANGIOGRAPHY N/A 07/18/2021   Procedure: LEFT HEART CATH AND CORONARY ANGIOGRAPHY;  Surgeon: Mario Bollman, MD;  Location: [facility] INVASIVE CV LAB;  Service: Cardiovascular;  Laterality: N/A;   PACEMAKER IMPLANT N/A 12/31/2021   Procedure: PACEMAKER IMPLANT;  Surgeon: Mario Maw, MD;  Location: [facility] INVASIVE CV LAB;  Service: Cardiovascular;  Laterality: N/A;    Home Medications:  Allergies as of 01/18/2022   No Known Allergies      Medication List        Accurate as of January 18, 2022 10:20 AM. If you have any questions, ask your nurse or doctor.          acetaminophen 650 MG CR tablet Commonly known as: TYLENOL Take 1,300 mg by mouth every 8 (eight) hours as needed for pain.   atorvastatin 40 MG tablet Commonly known as: LIPITOR Take 1 tablet (40 mg total) by mouth daily.   bacitracin ointment Apply topically 2 (two) times daily. What changed:  when to  take this reasons to take this   lisinopril 5 MG tablet Commonly known as: ZESTRIL Take 1 tablet (5 mg total) by mouth daily.   multivitamin with minerals Tabs tablet Take 1 tablet by mouth daily.   nitroGLYCERIN 0.4 MG SL tablet Commonly known as: NITROSTAT Place 1 tablet (0.4 mg total) under the tongue every 5 (five) minutes as needed for chest pain.   rivaroxaban 20 MG Tabs tablet Commonly known as: XARELTO Take 1 tablet (20 mg total) by mouth daily with supper.   venlafaxine XR 150 MG 24 hr capsule Commonly known as: EFFEXOR-XR Take 1 capsule (150 mg total) by mouth daily.   ziprasidone 20 MG capsule Commonly known as: GEODON Take 1 capsule (20 mg total) by mouth daily.        Allergies: No Known Allergies  Family History: No family history on file.  Social History:  reports that he has never smoked. He has never been exposed to tobacco smoke. He has never used smokeless tobacco. He reports current alcohol use. He reports that he does not use drugs.  ROS: All other review of systems were reviewed and are negative except what is noted above in HPI  Physical Exam: BP 130/70   Pulse 70   Constitutional:  Alert and oriented, No acute distress. HEENT: [place] AT, moist mucus membranes.  Trachea midline, no masses. Cardiovascular:  No clubbing, cyanosis, or edema. Respiratory: Normal respiratory effort, no increased work of breathing. GI: Abdomen is soft, nontender, nondistended, no abdominal masses GU: No CVA tenderness.  Lymph: No cervical or inguinal lymphadenopathy. Skin: No rashes, bruises or suspicious lesions. Neurologic: Grossly intact, no focal deficits, moving all 4 extremities. Psychiatric: Normal mood and affect.  Laboratory Data: Lab Results  Component Value Date   WBC 7.2 12/11/2021   HGB 12.5 (L) 12/11/2021   HCT 38.2 (L) 12/11/2021   MCV 91.8 12/11/2021   PLT 155 12/11/2021    Lab Results  Component Value Date   CREATININE 0.79 12/11/2021     No results found for: "PSA"  No results found for: "TESTOSTERONE"  No results found for: "HGBA1C"  Urinalysis    Component Value Date/Time   COLORURINE YELLOW 04/18/2021 2153   APPEARANCEUR Cloudy (A) 11/13/2021 1447   LABSPEC 1.013 04/18/2021 2153   PHURINE 6.0 04/18/2021 2153   GLUCOSEU Negative 11/13/2021 1447   HGBUR LARGE (A) 04/18/2021 2153   BILIRUBINUR Negative 11/13/2021 1447   KETONESUR NEGATIVE 04/18/2021 2153   PROTEINUR 1+ (A) 11/13/2021 1447   PROTEINUR 100 (A) 04/18/2021 2153   UROBILINOGEN 1.0 06/07/2012 1313   NITRITE Negative 11/13/2021 1447   NITRITE NEGATIVE 04/18/2021 2153   LEUKOCYTESUR 3+ (A) 11/13/2021 1447   LEUKOCYTESUR LARGE (A) 04/18/2021 2153    Lab Results  Component Value Date   LABMICR See below: 11/13/2021   WBCUA >30 (A) 11/13/2021   LABEPIT 0-10 11/13/2021   BACTERIA Moderate (A) 11/13/2021    Pertinent Imaging:  No results found for this or any previous visit.  No results found for this or any previous visit.  No results found for this or any previous visit.  No results found for this or any previous visit.  Results for orders placed during the hospital encounter of 04/18/21  US RENAL  Narrative CLINICAL DATA:  AK I, chronic Foley catheter.  EXAM: RENAL / URINARY TRACT ULTRASOUND COMPLETE  COMPARISON:  CT April 19, 2021  FINDINGS: Right Kidney:  Renal measurements: 12.1 x 7.7 x 6.6 cm = volume: 324 mL. Echogenicity within normal limits. No mass or hydronephrosis visualized.  Left Kidney:  Renal measurements: 12.4 x 6.9 x 6.4 cm = volume: 287 mL. Echogenicity within normal limits. No mass or hydronephrosis visualized.  Bladder:  Decompressed around a Foley catheter.  Other:  None.  IMPRESSION: NO hydronephrosis.   Electronically Signed By: Mario Massey M.D. On: 04/20/2021 16:17  No valid procedures specified. No results found for this or any previous visit.  No results found for this or  any previous visit.   Assessment & Plan:    1. Urinary retention -Schedule for urodynamics - Ambulatory referral to Urology  2. Chronic indwelling Foley catheter -foley changed today. Followup 1 month for foley change   No follow-ups on file.  Mario Aye, MD  [facility] [place]

## 2022-01-25 ENCOUNTER — Encounter: Payer: Self-pay | Admitting: Urology

## 2022-01-25 NOTE — Patient Instructions (Signed)
Acute Urinary Retention, Male  Acute urinary retention is when a person cannot pee (urinate) at all, or can only pee a little. This can come on all of a sudden. If it is not treated, it can lead to kidney problems or other serious problems. What are the causes? A problem with the tube that drains the bladder (urethra). Problems with the nerves in the bladder. Tumors. Certain medicines. An infection. Having trouble pooping (constipation). What increases the risk? Older men are more at risk because their prostate gland may become larger as they age. Other conditions also can increase risk. These include: Diseases, such as multiple sclerosis. Injury to the spinal cord. Diabetes. A condition that affects the way the brain works, such as dementia. Holding back urine due to trauma or because you do not want to use the bathroom. What are the signs or symptoms? Trouble peeing. Pain in the lower belly. How is this treated? Treatment for this condition may include: Medicines. Placing a thin, germ-free tube (catheter) into the bladder to drain pee out of the body. Therapy to treat mental health conditions. Treatment for conditions that may cause this. If needed, you may be treated in the hospital for kidney problems or to manage other problems. Follow these instructions at home: Medicines Take over-the-counter and prescription medicines only as told by your doctor. Ask your doctor what medicines you should stay away from. If you were given an antibiotic medicine, take it as told by your doctor. Do not stop taking it, even if you start to feel better. General instructions Do not smoke or use any products that contain nicotine or tobacco. If you need help quitting, ask your doctor. Drink enough fluid to keep your pee pale yellow. If you were sent home with a tube that drains the bladder, take care of it as told by your doctor. Watch for changes in your symptoms. Tell your doctor about them. If  told, keep track of changes in your blood pressure at home. Tell your doctor about them. Keep all follow-up visits. Contact a doctor if: You have spasms in your bladder that you cannot stop. You leak pee when you have spasms. Get help right away if: You have chills or a fever. You have blood in your pee. You have a tube that drains pee from the bladder and these things happen: The tube stops draining pee. The tube falls out. Summary Acute urinary retention is when you cannot pee at all or you pee too little. If this condition is not treated, it can lead to kidney problems or other serious problems. If you were sent home with a tube (catheter) that drains the bladder, take care of it as told by your doctor. Watch for changes in your symptoms. Tell your doctor about them. This information is not intended to replace advice given to you by your health care provider. Make sure you discuss any questions you have with your health care provider. Document Revised: 10/06/2019 Document Reviewed: 10/06/2019 Elsevier Patient Education  2023 [employer] Inc[employer]  

## 2022-02-08 ENCOUNTER — Emergency Department (HOSPITAL_COMMUNITY): Payer: No Typology Code available for payment source

## 2022-02-08 ENCOUNTER — Emergency Department (HOSPITAL_COMMUNITY)
Admission: EM | Admit: 2022-02-08 | Discharge: 2022-02-08 | Disposition: A | Discharge status: Home / Self Care | Payer: No Typology Code available for payment source | Attending: Emergency Medicine | Admitting: Emergency Medicine

## 2022-02-08 DIAGNOSIS — S50812A Abrasion of left forearm, initial encounter: Secondary | ICD-10-CM | POA: Insufficient documentation

## 2022-02-08 DIAGNOSIS — S62337A Displaced fracture of neck of fifth metacarpal bone, left hand, initial encounter for closed fracture: Secondary | ICD-10-CM | POA: Insufficient documentation

## 2022-02-08 DIAGNOSIS — W010XXA Fall on same level from slipping, tripping and stumbling without subsequent striking against object, initial encounter: Secondary | ICD-10-CM | POA: Insufficient documentation

## 2022-02-08 DIAGNOSIS — S62339A Displaced fracture of neck of unspecified metacarpal bone, initial encounter for closed fracture: Secondary | ICD-10-CM

## 2022-02-08 DIAGNOSIS — S6992XA Unspecified injury of left wrist, hand and finger(s), initial encounter: Secondary | ICD-10-CM | POA: Diagnosis present

## 2022-02-08 DIAGNOSIS — Z79899 Other long term (current) drug therapy: Secondary | ICD-10-CM | POA: Insufficient documentation

## 2022-02-08 DIAGNOSIS — Z7901 Long term (current) use of anticoagulants: Secondary | ICD-10-CM | POA: Diagnosis not present

## 2022-02-08 LAB — CBG MONITORING, ED: Glucose-Capillary: 92 mg/dL (ref 70–99)

## 2022-02-08 NOTE — ED Triage Notes (Signed)
Pt to ED c/o fall yesterday at 6 pm . Pt tripped and fell outside. Pt did not hit head. C/O pain to left hand, swelling noted. Pt on blood thinner-xerelto .

## 2022-02-08 NOTE — Discharge Instructions (Signed)
You have a fracture on the fifth knuckle as we discussed.  You will need to wear the splint, call and schedule an appointment with Dr. Tempie Donning for follow-up next week.  You can take Tylenol for pain, avoid any anti-inflammatories as you are on Xarelto.  Return to the ED for new or concerning symptoms.

## 2022-02-08 NOTE — ED Provider Notes (Signed)
[facility] EMERGENCY DEPARTMENT Provider Note   CSN: [id number] Arrival date & time: 02/08/22  1304     History  Chief Complaint  Patient presents with   Fall    Blood thinner    Hand Injury    left    Mario Massey is a 77 y.o. male.   Fall  Hand Injury    Patient with medical history of atrial flutter on Xarelto presents to the emergency department due to fall.  Patient states he had a mechanical fall yesterday evening around 6 PM which was witnessed by his daughter.  He did not hit his head, he landed on his left upper extremity in a fist.  He is right-hand dominant.  He has superficial abrasions to the left upper extremity which were dressed, tetanus is up-to-date.  Brought to ED today for evaluation due to significant bruising over the left upper extremity over the hand.  Denies any paresthesias, pain is worse with movement.  He is able to make a fist.  No pain elsewhere.  Home Medications Prior to Admission medications   Medication Sig Start Date End Date Taking? Authorizing Provider  acetaminophen (TYLENOL) 650 MG CR tablet Take 1,300 mg by mouth every 8 (eight) hours as needed for pain.    [provider]  atorvastatin (LIPITOR) 40 MG tablet Take 1 tablet (40 mg total) by mouth daily. 01/15/22   Hilty, Nadean Corwin, MD  bacitracin ointment Apply topically 2 (two) times daily. Patient taking differently: Apply topically as needed. 04/23/21   Raiford Noble Latif, DO  lisinopril (ZESTRIL) 5 MG tablet Take 1 tablet (5 mg total) by mouth daily. 05/11/21   Gerlene Fee, NP  Multiple Vitamin (MULTIVITAMIN WITH MINERALS) TABS tablet Take 1 tablet by mouth daily.    [provider]  nitroGLYCERIN (NITROSTAT) 0.4 MG SL tablet Place 1 tablet (0.4 mg total) under the tongue every 5 (five) minutes as needed for chest pain. 10/10/21   Imogene Burn, PA-C  rivaroxaban (XARELTO) 20 MG TABS tablet Take 1 tablet (20 mg total) by mouth daily with supper. 01/15/22    Pixie Casino, MD  venlafaxine XR (EFFEXOR-XR) 150 MG 24 hr capsule Take 1 capsule (150 mg total) by mouth daily. 05/11/21   Gerlene Fee, NP  ziprasidone (GEODON) 20 MG capsule Take 1 capsule (20 mg total) by mouth daily. 05/11/21   Gerlene Fee, NP      Allergies    Patient has no known allergies.    Review of Systems   Review of Systems  Physical Exam Updated Vital Signs BP (!) 143/67 (BP Location: Right Arm)   Pulse 77   Temp 97.9 F (36.6 C) (Oral)   Resp 16   Ht 6' (1.829 m)   Wt 110.7 kg   SpO2 97%   BMI 33.09 kg/m  Physical Exam Vitals and nursing note reviewed. Exam conducted with a chaperone present.  Constitutional:      General: He is not in acute distress.    Appearance: Normal appearance.  HENT:     Head: Normocephalic and atraumatic.     Comments: No indications of facial trauma Eyes:     General: No scleral icterus.    Extraocular Movements: Extraocular movements intact.     Pupils: Pupils are equal, round, and reactive to light.  Cardiovascular:     Rate and Rhythm: Normal rate and regular rhythm.     Pulses: Normal pulses.  Pulmonary:  Effort: Pulmonary effort is normal.  Abdominal:     General: Abdomen is flat.     Tenderness: There is no abdominal tenderness.  Musculoskeletal:        General: Tenderness present.     Cervical back: Normal range of motion. No tenderness.  Skin:    Capillary Refill: Capillary refill takes less than 2 seconds.     Coloration: Skin is not jaundiced.     Findings: Bruising present.     Comments: Bruising over dorsum of left upper extremity hand third through fifth digits.  Abrasion over fifth digit knuckle, probed and does not extend deeper into the bone.  Superficial abrasion.  Also superficial abrasions over left forearm  Neurological:     Mental Status: He is alert. Mental status is at baseline.     Coordination: Coordination normal.     Comments: Sensation circumferentially intact to the left upper  extremity     ED Results / Procedures / Treatments   Labs (all labs ordered are listed, but only abnormal results are displayed) Labs Reviewed  CBG MONITORING, ED    EKG None  Radiology DG Wrist Complete Left  Result Date: 02/08/2022 CLINICAL DATA:  Fall on outstretched left hand. EXAM: LEFT WRIST - COMPLETE 3+ VIEW COMPARISON:  Left hand radiographs 02/08/2022. Left wrist radiographs 02/24/2020. FINDINGS: Redemonstration of an oblique fracture of the distal left fifth metacarpal with 5 mm anterior/radial displacement. No acute fracture or dislocation of the left wrist. Specifically, the scaphoid is intact. There is no evidence of arthropathy or other focal bone abnormality. Soft tissues are unremarkable. IMPRESSION: 1. No acute fracture or dislocation of the left wrist. 2. Redemonstrated oblique fracture of the distal left fifth metacarpal. Electronically Signed   By: Orvan Falconer M.D   On: 02/08/2022 15:38   DG Forearm Left  Result Date: 02/08/2022 CLINICAL DATA:  Acute LEFT forearm pain following fall. Initial encounter. EXAM: LEFT FOREARM - 2 VIEW COMPARISON:  None Available. FINDINGS: No acute radial or ulnar fracture noted. No subluxation or dislocation. Soft tissues are within normal limits. IMPRESSION: No acute abnormality. Electronically Signed   By: Harmon Pier M.D.   On: 02/08/2022 15:00   DG Elbow Complete Left  Result Date: 02/08/2022 CLINICAL DATA:  Acute LEFT elbow pain following fall. Initial encounter. EXAM: LEFT ELBOW - COMPLETE 3+ VIEW COMPARISON:  None Available. FINDINGS: There is no evidence of fracture, dislocation, or joint effusion. There is no evidence of arthropathy or other focal bone abnormality. IMPRESSION: Negative. Electronically Signed   By: Harmon Pier M.D.   On: 02/08/2022 15:00   DG Hand Complete Left  Result Date: 02/08/2022 CLINICAL DATA:  Acute LEFT hand pain following fall today. Initial encounter. EXAM: LEFT HAND - COMPLETE 3+ VIEW COMPARISON:   None Available. FINDINGS: An oblique fracture of the distal 5th metacarpal is noted with 5 mm anterior/radial displacement. No other fracture, subluxation or dislocation identified. IMPRESSION: Oblique fracture of the distal 5th metacarpal with 5 mm anterior/radial displacement. Electronically Signed   By: Harmon Pier M.D.   On: 02/08/2022 14:59    Procedures Procedures    Medications Ordered in ED Medications - No data to display  ED Course/ Medical Decision Making/ A&P                             Medical Decision Making Amount and/or Complexity of Data Reviewed Radiology: ordered.   Patient presents due to mechanical  fall.  Differential includes but limited to fractures, dislocations, other traumatic injuries.  Given he had a witnessed fall and did not his head I do not think need to proceed with CT head.  On exam he is neurovascular intact with good cap refill, radial pulses 2+.  He is bruising to the dorsum of his hand as well as superficial abrasions to the dorsum of his hand and forearm.  He is able to flex and extend, able to make a fist.  Reproducible tenderness over the fifth digit.  Will proceed with plain film imaging.  Do not feel this is an open fracture given it does not extend deeper superficial tissue.  X-ray is notable for oblique fracture of fifth digit.  Suspect ulnar gutter will be sufficient but I will consult hand surgery to confirm.  Spoke with Dr. Tempie Donning who agrees with ulnar gutter and they will see the patient in the office next week.  I reevaluated the patient after splint application, neuro vas intact.  We discussed outpatient follow-up plan and all questions were answered.  Stable for discharge at this time.  Discussed HPI, physical exam and plan of care for this patient with attending Noemi Chapel. The attending physician evaluated this patient as part of a shared visit and agrees with plan of care.         Final Clinical Impression(s) / ED  Diagnoses Final diagnoses:  Closed boxer's fracture, initial encounter    Rx / DC Orders ED Discharge Orders     None         Sherrill Raring, Hershal Coria 02/08/22 1837    Noemi Chapel, MD 02/09/22 1436

## 2022-02-08 NOTE — ED Provider Triage Note (Signed)
Emergency Medicine Provider Triage Evaluation Note  Mario Massey , a 77 y.o. male  was evaluated in triage.  Pt complains of Mario Massey with the left hand. Has pain in the elbow, wirst and forearm. He is on a blood thinner, but did not hit his head. Mechanical witnessed fall. Daughter denies that he hit his head as well. Fall happened yesterday.  Review of Systems  Positive:  Negative:   Physical Exam  BP (!) 143/67 (BP Location: Right Arm)   Pulse 77   Temp 97.8 F (36.6 C) (Oral)   Resp 16   Ht 6' (1.829 m)   Wt 110.7 kg   SpO2 97%   BMI 33.09 kg/m  Gen:   Awake, no distress   Resp:  Normal effort  MSK:   Moves extremities without difficulty  Other:  Significant swelling and bruising to the ulnar distal aspect. Skin tear to the arm. Pain with movement.   Medical Decision Making  Medically screening exam initiated at 2:16 PM.  Appropriate orders placed.  Mario Massey was informed that the remainder of the evaluation will be completed by another provider, this initial triage assessment does not replace that evaluation, and the importance of remaining in the ED until their evaluation is complete.  XR ordered   Mario Puller, PA-C 02/08/22 1418

## 2022-02-12 ENCOUNTER — Encounter: Payer: Self-pay | Admitting: Internal Medicine

## 2022-03-01 ENCOUNTER — Ambulatory Visit (INDEPENDENT_AMBULATORY_CARE_PROVIDER_SITE_OTHER): Payer: No Typology Code available for payment source | Admitting: Urology

## 2022-03-01 DIAGNOSIS — R339 Retention of urine, unspecified: Secondary | ICD-10-CM | POA: Diagnosis not present

## 2022-03-01 NOTE — Progress Notes (Unsigned)
Cath Change/ Replacement  Patient is present today for a catheter change due to urinary retention.  47ml of water was removed from the balloon, a 18FR foley cath was removed without difficulty.  Patient was cleaned and prepped in a sterile fashion with betadine and 2% lidocaine jelly was instilled into the urethra. A 18 FR foley cath was replaced into the bladder, no complications were noted. Urine return was noted 4ml and urine was yellow in color. The balloon was filled with 19ml of sterile water. A leg bag was attached for drainage.   Patient was given proper instruction on catheter care.    Performed by: Levi Aland, CMA  Follow up: Follow up as scheduled.

## 2022-03-05 ENCOUNTER — Ambulatory Visit: Payer: No Typology Code available for payment source | Admitting: Gastroenterology

## 2022-03-12 ENCOUNTER — Ambulatory Visit (INDEPENDENT_AMBULATORY_CARE_PROVIDER_SITE_OTHER): Payer: No Typology Code available for payment source | Admitting: Neurology

## 2022-03-12 ENCOUNTER — Encounter: Payer: Self-pay | Admitting: Neurology

## 2022-03-12 VITALS — BP 131/69 | HR 69 | Ht 72.0 in | Wt 257.0 lb

## 2022-03-12 DIAGNOSIS — G4719 Other hypersomnia: Secondary | ICD-10-CM

## 2022-03-12 DIAGNOSIS — Z789 Other specified health status: Secondary | ICD-10-CM

## 2022-03-12 DIAGNOSIS — G2119 Other drug induced secondary parkinsonism: Secondary | ICD-10-CM | POA: Diagnosis not present

## 2022-03-12 DIAGNOSIS — Z9189 Other specified personal risk factors, not elsewhere classified: Secondary | ICD-10-CM

## 2022-03-12 DIAGNOSIS — R413 Other amnesia: Secondary | ICD-10-CM
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
## 2022-03-12 NOTE — Patient Instructions (Addendum)
You have complaints of memory loss: memory loss or changes in cognitive function can have many reasons and does not always mean you have dementia. Conditions that can contribute to subjective or objective memory loss include: depression, stress, poor sleep from insomnia or sleep apnea, dehydration, fluctuation in blood sugar values, thyroid or electrolyte dysfunction and certain vitamin deficiencies. Dementia can be caused by stroke, brain atherosclerosis or brain vascular disease due to vascular risk factors (smoking, high blood pressure, high cholesterol, obesity and uncontrolled diabetes), certain degenerative brain disorders (including Parkinson's disease and Multiple sclerosis) and by Alzheimer's disease or other, more rare and sometimes hereditary causes. We will do some additional testing: blood work (which we will do today) and I will order a head CT scan. 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  You may be at risk for obstructive sleep apnea. I recommend you have a sleep study. You can discuss further with your primary care or your [facility] provided. If you change your mind and would be willing to have a sleep study through our office, I would be happy to order a test, even a home sleep test. Sleep apnea may contribute to memory loss. 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   Please increase your water intake to about 6-8 cups of water and limit your caffeine to 2-3 servings per day.   We will call you with CT scan and blood test results and recommend a follow up accordingly.

## 2022-03-18 ENCOUNTER — Telehealth: Payer: Self-pay | Admitting: *Deleted

## 2022-03-18 NOTE — Telephone Encounter (Signed)
Spoke with pt's daughter [name] (on [place]) and discussed lab results. She will f/u with PCP regarding TSH recheck in 3-6 months. Labs sent to pcp, Dr Legrand Rams. We will call her back if B6 abnormal/concerning. She verbalized appreciation.

## 2022-03-18 NOTE — Telephone Encounter (Signed)
-----   Message from Star Age, MD sent at 03/18/2022 12:35 PM EST ----- Labs benign, thyroid screening test called TSH slightly above normal, not likely to be significant, other labs benign, 1 test is still pending which is the vitamin B6 level, it takes longer to come back, we will update if abnormal.  Please notify patient.  I recommend rechecking TSH with primary care within the next 3 to 6 months.

## 2022-03-19 LAB — RPR: RPR Ser Ql: NONREACTIVE

## 2022-03-19 LAB — COMPREHENSIVE METABOLIC PANEL
ALT: 20 IU/L (ref 0–44)
AST: 30 IU/L (ref 0–40)
Albumin/Globulin Ratio: 2 (ref 1.2–2.2)
Albumin: 4.5 g/dL (ref 3.8–4.8)
Alkaline Phosphatase: 95 IU/L (ref 44–121)
BUN/Creatinine Ratio: 25 — ABNORMAL HIGH (ref 10–24)
BUN: 23 mg/dL (ref 8–27)
Bilirubin Total: 0.5 mg/dL (ref 0.0–1.2)
CO2: 26 mmol/L (ref 20–29)
Calcium: 9.6 mg/dL (ref 8.6–10.2)
Chloride: 102 mmol/L (ref 96–106)
Creatinine, Ser: 0.92 mg/dL (ref 0.76–1.27)
Globulin, Total: 2.3 g/dL (ref 1.5–4.5)
Glucose: 83 mg/dL (ref 70–99)
Potassium: 4.7 mmol/L (ref 3.5–5.2)
Sodium: 141 mmol/L (ref 134–144)
Total Protein: 6.8 g/dL (ref 6.0–8.5)
eGFR: 86 mL/min/{1.73_m2} (ref >59)

## 2022-03-19 LAB — AMMONIA: Ammonia: 61 ug/dL (ref 31–169)

## 2022-03-19 LAB — VITAMIN B1: Thiamine: 132.4 nmol/L (ref 66.5–200.0)

## 2022-03-19 LAB — SEDIMENTATION RATE: Sed Rate: 6 mm/hr (ref 0–30)

## 2022-03-19 LAB — VITAMIN B6: Vitamin B6: 34.1 ug/L (ref 3.4–65.2)

## 2022-03-19 LAB — TSH: TSH: 4.54 u[IU]/mL — ABNORMAL HIGH (ref 0.450–4.500)

## 2022-03-19 LAB — HGB A1C W/O EAG: Hgb A1c MFr Bld: 5.6 % (ref 4.8–5.6)

## 2022-03-20 ENCOUNTER — Ambulatory Visit: Payer: No Typology Code available for payment source | Admitting: Urology

## 2022-03-20 VITALS — BP 162/72 | HR 70

## 2022-03-20 DIAGNOSIS — R339 Retention of urine, unspecified: Secondary | ICD-10-CM

## 2022-03-20 DIAGNOSIS — N401 Enlarged prostate with lower urinary tract symptoms: Secondary | ICD-10-CM

## 2022-03-20 NOTE — Progress Notes (Unsigned)
03/20/2022 2:24 PM   [facility] 1945-04-01 [id number]  Referring provider: Carrolyn Meiers, MD [place],  [place] [address]  Followup urinary retention   HPI: Mario Massey is a 77yo here for followup for BPh with urinary retention. Urodynamics shows 300cc capacity and 28cm of water max detrusor contraction. 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 His urinary retention is currently managed with an indwelling foley.    PMH: Past Medical History:  Diagnosis Date   Ascending aorta dilation (HCC)    Atrial flutter (HCC)    AV dissociation    Bipolar disorder ([place])    Coronary artery disease    Depression    Essential hypertension    Hyperlipidemia LDL goal <70    Hypertension    RBBB    Second degree AV block, Mobitz type I     Surgical History: Past Surgical History:  Procedure Laterality Date   KNEE SURGERY     with replacement on right knee   LEFT HEART CATH AND CORONARY ANGIOGRAPHY N/A 07/18/2021   Procedure: LEFT HEART CATH AND CORONARY ANGIOGRAPHY;  Surgeon: Sherren Mocha, MD;  Location: [facility];  Service: Cardiovascular;  Laterality: N/A;   PACEMAKER IMPLANT N/A 12/31/2021   Procedure: PACEMAKER IMPLANT;  Surgeon: Evans Lance, MD;  Location: [facility];  Service: Cardiovascular;  Laterality: N/A;    Home Medications:  Allergies as of 03/20/2022   No Known Allergies      Medication List        Accurate as of March 20, 2022  2:24 PM. If you have any questions, ask your nurse or doctor.          acetaminophen 650 MG CR tablet Commonly known as: TYLENOL Take 1,300 mg by mouth as needed for pain.   atorvastatin 40 MG tablet Commonly known as: LIPITOR Take 1 tablet (40 mg total) by mouth daily.   bacitracin ointment Apply topically 2 (two) times daily. What changed:  when to take this reasons to take this   lisinopril 5 MG tablet Commonly known as: ZESTRIL Take 1 tablet (5 mg total) by  mouth daily.   multivitamin with minerals Tabs tablet Take 1 tablet by mouth daily.   nitroGLYCERIN 0.4 MG SL tablet Commonly known as: NITROSTAT Place 1 tablet (0.4 mg total) under the tongue every 5 (five) minutes as needed for chest pain.   rivaroxaban 20 MG Tabs tablet Commonly known as: XARELTO Take 1 tablet (20 mg total) by mouth daily with supper.   venlafaxine XR 150 MG 24 hr capsule Commonly known as: EFFEXOR-XR Take 1 capsule (150 mg total) by mouth daily.   ziprasidone 20 MG capsule Commonly known as: GEODON Take 1 capsule (20 mg total) by mouth daily.        Allergies: No Known Allergies  Family History: Family History  Problem Relation Age of Onset   Alzheimer's disease Neg Hx    Parkinson's disease Neg Hx     Social History:  reports that he has never smoked. He has never been exposed to tobacco smoke. He has never used smokeless tobacco. He reports current alcohol use. He reports that he does not use drugs.  ROS: All other review of systems were reviewed and are negative except what is noted above in HPI  Physical Exam: BP (!) 162/72   Pulse 70   Constitutional:  Alert and oriented, No acute distress. HEENT: [place] AT, moist mucus membranes.  Trachea  midline, no masses. Cardiovascular: No clubbing, cyanosis, or edema. Respiratory: Normal respiratory effort, no increased work of breathing. GI: Abdomen is soft, nontender, nondistended, no abdominal masses GU: No CVA tenderness.  Lymph: No cervical or inguinal lymphadenopathy. Skin: No rashes, bruises or suspicious lesions. Neurologic: Grossly intact, no focal deficits, moving all 4 extremities. Psychiatric: Normal mood and affect.  Laboratory Data: Lab Results  Component Value Date   WBC 7.2 12/11/2021   HGB 12.5 (L) 12/11/2021   HCT 38.2 (L) 12/11/2021   MCV 91.8 12/11/2021   PLT 155 12/11/2021    Lab Results  Component Value Date   CREATININE 0.92 03/12/2022    No results found for:  "PSA"  No results found for: "TESTOSTERONE"  Lab Results  Component Value Date   HGBA1C 5.6 03/12/2022    Urinalysis    Component Value Date/Time   COLORURINE YELLOW 04/18/2021 2153   APPEARANCEUR Cloudy (A) 11/13/2021 1447   LABSPEC 1.013 04/18/2021 2153   PHURINE 6.0 04/18/2021 2153   GLUCOSEU Negative 11/13/2021 1447   HGBUR LARGE (A) 04/18/2021 2153   BILIRUBINUR Negative 11/13/2021 1447   KETONESUR NEGATIVE 04/18/2021 2153   PROTEINUR 1+ (A) 11/13/2021 1447   PROTEINUR 100 (A) 04/18/2021 2153   UROBILINOGEN 1.0 06/07/2012 1313   NITRITE Negative 11/13/2021 1447   NITRITE NEGATIVE 04/18/2021 2153   LEUKOCYTESUR 3+ (A) 11/13/2021 1447   LEUKOCYTESUR LARGE (A) 04/18/2021 2153    Lab Results  Component Value Date   LABMICR See below: 11/13/2021   WBCUA >30 (A) 11/13/2021   LABEPIT 0-10 11/13/2021   BACTERIA Moderate (A) 11/13/2021    Pertinent Imaging:  No results found for this or any previous visit.  No results found for this or any previous visit.  No results found for this or any previous visit.  No results found for this or any previous visit.  Results for orders placed during the hospital encounter of 04/18/21  US RENAL  Narrative CLINICAL DATA:  AK I, chronic Foley catheter.  EXAM: RENAL / URINARY TRACT ULTRASOUND COMPLETE  COMPARISON:  CT April 19, 2021  FINDINGS: Right Kidney:  Renal measurements: 12.1 x 7.7 x 6.6 cm = volume: 324 mL. Echogenicity within normal limits. No mass or hydronephrosis visualized.  Left Kidney:  Renal measurements: 12.4 x 6.9 x 6.4 cm = volume: 287 mL. Echogenicity within normal limits. No mass or hydronephrosis visualized.  Bladder:  Decompressed around a Foley catheter.  Other:  None.  IMPRESSION: NO hydronephrosis.   Electronically Signed By: Dahlia Bailiff M.D. On: 04/20/2021 16:17  No valid procedures specified. No results found for this or any previous visit.  No results found for this  or any previous visit.   Assessment & Plan:    1. Urinary retention, BPH We discussed the management of his BPH including continued medical therapy, Rezum, Urolift, TURP and simple prostatectomy. After discussing the options the patient has elected to proceed with TURP. Risks/benefits/alternatives discussed.    No follow-ups on file.  Nicolette Bang, MD  [facility] [place]

## 2022-03-20 NOTE — H&P (View-Only) (Signed)
03/20/2022 2:24 PM   [facility] 1945-04-01 [id number]  Referring provider: Carrolyn Meiers, MD [place],  [place] [address]  Followup urinary retention   HPI: Mr Orn is a 77yo here for followup for BPh with urinary retention. Urodynamics shows 300cc capacity and 28cm of water max detrusor contraction. 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 His urinary retention is currently managed with an indwelling foley.    PMH: Past Medical History:  Diagnosis Date   Ascending aorta dilation (HCC)    Atrial flutter (HCC)    AV dissociation    Bipolar disorder ([place])    Coronary artery disease    Depression    Essential hypertension    Hyperlipidemia LDL goal <70    Hypertension    RBBB    Second degree AV block, Mobitz type I     Surgical History: Past Surgical History:  Procedure Laterality Date   KNEE SURGERY     with replacement on right knee   LEFT HEART CATH AND CORONARY ANGIOGRAPHY N/A 07/18/2021   Procedure: LEFT HEART CATH AND CORONARY ANGIOGRAPHY;  Surgeon: Sherren Mocha, MD;  Location: [facility];  Service: Cardiovascular;  Laterality: N/A;   PACEMAKER IMPLANT N/A 12/31/2021   Procedure: PACEMAKER IMPLANT;  Surgeon: Evans Lance, MD;  Location: [facility];  Service: Cardiovascular;  Laterality: N/A;    Home Medications:  Allergies as of 03/20/2022   No Known Allergies      Medication List        Accurate as of March 20, 2022  2:24 PM. If you have any questions, ask your nurse or doctor.          acetaminophen 650 MG CR tablet Commonly known as: TYLENOL Take 1,300 mg by mouth as needed for pain.   atorvastatin 40 MG tablet Commonly known as: LIPITOR Take 1 tablet (40 mg total) by mouth daily.   bacitracin ointment Apply topically 2 (two) times daily. What changed:  when to take this reasons to take this   lisinopril 5 MG tablet Commonly known as: ZESTRIL Take 1 tablet (5 mg total) by  mouth daily.   multivitamin with minerals Tabs tablet Take 1 tablet by mouth daily.   nitroGLYCERIN 0.4 MG SL tablet Commonly known as: NITROSTAT Place 1 tablet (0.4 mg total) under the tongue every 5 (five) minutes as needed for chest pain.   rivaroxaban 20 MG Tabs tablet Commonly known as: XARELTO Take 1 tablet (20 mg total) by mouth daily with supper.   venlafaxine XR 150 MG 24 hr capsule Commonly known as: EFFEXOR-XR Take 1 capsule (150 mg total) by mouth daily.   ziprasidone 20 MG capsule Commonly known as: GEODON Take 1 capsule (20 mg total) by mouth daily.        Allergies: No Known Allergies  Family History: Family History  Problem Relation Age of Onset   Alzheimer's disease Neg Hx    Parkinson's disease Neg Hx     Social History:  reports that he has never smoked. He has never been exposed to tobacco smoke. He has never used smokeless tobacco. He reports current alcohol use. He reports that he does not use drugs.  ROS: All other review of systems were reviewed and are negative except what is noted above in HPI  Physical Exam: BP (!) 162/72   Pulse 70   Constitutional:  Alert and oriented, No acute distress. HEENT: [place] AT, moist mucus membranes.  Trachea  midline, no masses. Cardiovascular: No clubbing, cyanosis, or edema. Respiratory: Normal respiratory effort, no increased work of breathing. GI: Abdomen is soft, nontender, nondistended, no abdominal masses GU: No CVA tenderness.  Lymph: No cervical or inguinal lymphadenopathy. Skin: No rashes, bruises or suspicious lesions. Neurologic: Grossly intact, no focal deficits, moving all 4 extremities. Psychiatric: Normal mood and affect.  Laboratory Data: Lab Results  Component Value Date   WBC 7.2 12/11/2021   HGB 12.5 (L) 12/11/2021   HCT 38.2 (L) 12/11/2021   MCV 91.8 12/11/2021   PLT 155 12/11/2021    Lab Results  Component Value Date   CREATININE 0.92 03/12/2022    No results found for:  "PSA"  No results found for: "TESTOSTERONE"  Lab Results  Component Value Date   HGBA1C 5.6 03/12/2022    Urinalysis    Component Value Date/Time   COLORURINE YELLOW 04/18/2021 2153   APPEARANCEUR Cloudy (A) 11/13/2021 1447   LABSPEC 1.013 04/18/2021 2153   PHURINE 6.0 04/18/2021 2153   GLUCOSEU Negative 11/13/2021 1447   HGBUR LARGE (A) 04/18/2021 2153   BILIRUBINUR Negative 11/13/2021 1447   KETONESUR NEGATIVE 04/18/2021 2153   PROTEINUR 1+ (A) 11/13/2021 1447   PROTEINUR 100 (A) 04/18/2021 2153   UROBILINOGEN 1.0 06/07/2012 1313   NITRITE Negative 11/13/2021 1447   NITRITE NEGATIVE 04/18/2021 2153   LEUKOCYTESUR 3+ (A) 11/13/2021 1447   LEUKOCYTESUR LARGE (A) 04/18/2021 2153    Lab Results  Component Value Date   LABMICR See below: 11/13/2021   WBCUA >30 (A) 11/13/2021   LABEPIT 0-10 11/13/2021   BACTERIA Moderate (A) 11/13/2021    Pertinent Imaging:  No results found for this or any previous visit.  No results found for this or any previous visit.  No results found for this or any previous visit.  No results found for this or any previous visit.  Results for orders placed during the hospital encounter of 04/18/21  US RENAL  Narrative CLINICAL DATA:  AK I, chronic Foley catheter.  EXAM: RENAL / URINARY TRACT ULTRASOUND COMPLETE  COMPARISON:  CT April 19, 2021  FINDINGS: Right Kidney:  Renal measurements: 12.1 x 7.7 x 6.6 cm = volume: 324 mL. Echogenicity within normal limits. No mass or hydronephrosis visualized.  Left Kidney:  Renal measurements: 12.4 x 6.9 x 6.4 cm = volume: 287 mL. Echogenicity within normal limits. No mass or hydronephrosis visualized.  Bladder:  Decompressed around a Foley catheter.  Other:  None.  IMPRESSION: NO hydronephrosis.   Electronically Signed By: Dahlia Bailiff M.D. On: 04/20/2021 16:17  No valid procedures specified. No results found for this or any previous visit.  No results found for this  or any previous visit.   Assessment & Plan:    1. Urinary retention, BPH We discussed the management of his BPH including continued medical therapy, Rezum, Urolift, TURP and simple prostatectomy. After discussing the options the patient has elected to proceed with TURP. Risks/benefits/alternatives discussed.    No follow-ups on file.  Nicolette Bang, MD  [facility] [place]

## 2022-03-21 ENCOUNTER — Encounter: Payer: Self-pay | Admitting: Urology

## 2022-03-21 NOTE — Patient Instructions (Signed)
Transurethral Resection of the Prostate ?Transurethral resection of the prostate (TURP) is the removal, or resection, of part of the prostate tissue. This procedure is done to treat an enlarged prostate gland (benign prostatic hyperplasia). ?The goal of TURP is to remove enough prostate tissue to allow for a normal flow of urine. The procedure will allow you to empty your bladder more completely when you urinate so that you can urinate less often. ?In a transurethral resection, a thin telescope with a light, a camera, and an electric cutting edge (resectoscope) is passed through the urethra and into the prostate. The opening of the urethra is at the end of the penis. ?Tell a health care provider about: ?Any allergies you have. ?All medicines you are taking, including vitamins, herbs, eye drops, creams, and over-the-counter medicines. ?Any problems you or family members have had with anesthetic medicines. ?Any bleeding problems you have. ?Any surgeries you have had. ?Any medical conditions you have. ?Any prostate infections you have had. ?What are the risks? ?Generally, this is a safe procedure. However, problems may occur, including: ?Infection. ?Bleeding. ?Allergic reactions to medicines. ?Blood in the urine (hematuria). ?Damage to nearby structures or organs. ?Other problems may occur, but they are rare. They include: ?Dry ejaculation, or having no semen come out during orgasm. ?Erectile dysfunction, or being unable to have or keep an erection. ?Scarring that leads to narrowing of the urethra. This narrowing may block the flow of urine. ?Inability to control when you urinate (incontinence). ?Deep vein thrombosis. This is a blood clot that can develop in your leg. ?TURP syndrome. This can happen when you lose too much sodium during or after the procedure. Some signs and symptoms of this condition include: ?Weakness. ?Headaches. ?Nausea or vomiting. ?Muscle cramping. ?What happens before the procedure? ?When to stop  eating and drinking ?Follow instructions from your health care provider about what you may eat and drink before your procedure. These may include: ?8 hours before your procedure ?Stop eating most foods. Do not eat meat, fried foods, or fatty foods. ?Eat only light foods, such as toast or crackers. ?All liquids are okay except energy drinks and alcohol. ?6 hours before your procedure ?Stop eating. ?Drink only clear liquids, such as water, clear fruit juice, black coffee, plain tea, and sports drinks. ?Do not drink energy drinks or alcohol. ?2 hours before your procedure ?Stop drinking all liquids. ?You may be allowed to take medicines with small sips of water. ?If you do not follow your health care provider's instructions, your procedure may be delayed or canceled. ?Medicines ?Ask your health care provider about: ?Changing or stopping your regular medicines. This is especially important if you are taking diabetes medicines or blood thinners. ?Taking medicines such as aspirin and ibuprofen. These medicines can thin your blood. Do not take these medicines unless your health care provider tells you to take them. ?Taking over-the-counter medicines, vitamins, herbs, and supplements. ?Surgery safety ?Ask your health care provider what steps will be taken to help prevent infection. These steps may include: ?Removing hair at the surgery site. ?Washing skin with a germ-killing soap. ?Taking antibiotic medicine. ?General instructions ?Do not use any products that contain nicotine or tobacco for at least 4 weeks before the procedure. These products include cigarettes, chewing tobacco, and vaping devices, such as e-cigarettes. If you need help quitting, ask your health care provider. ?If you will be going home right after the procedure, plan to have a responsible adult: ?Take you home from the hospital or clinic. You   will not be allowed to drive. ?Care for you for the time you are told. ?What happens during the procedure? ? ?An  IV will be inserted into one of your veins. ?You will be given one or more of the following: ?A medicine to help you relax (sedative). ?A medicine to make you fall asleep (general anesthetic). ?A medicine that is injected into your spine to numb the area below and slightly above the injection site (spinal anesthetic). ?Your legs will be placed in foot rests (stirrups) so that your legs are apart and your knees are bent. ?The resectoscope will be passed through your urethra to your prostate. ?Parts of your prostate will be resected using the cutting edge of the resectoscope. ?Fluid will be passed to rinse out the cut tissues (irrigation). ?The resectoscope will be removed. ?A small, thin tube (catheter) will be passed through your urethra and into your bladder. The catheter will drain urine into a bag outside of your body. ?The procedure may vary among health care providers and hospitals. ?What happens after the procedure? ?Your blood pressure, heart rate, breathing rate, and blood oxygen level will be monitored until you leave the hospital or clinic. ?You will be given fluids through the IV. The IV will be removed when you start eating and drinking normally. ?You may have some pain. Pain medicine will be available to help you. ?You will have a catheter draining your urine. ?You may have blood in your urine. Your catheter may be kept in until your urine is clear. ?Your urinary drainage will be monitored. If necessary, your bladder may be rinsed out (irrigated) through your catheter. ?You will be encouraged to walk around as soon as possible. ?You may have to wear compression stockings. These stockings help to prevent blood clots and reduce swelling in your legs. ?If you were given a sedative during the procedure, it can affect you for several hours. Do not drive or operate machinery until your health care provider says that it is safe. ?Summary ?Transurethral resection of the prostate (TURP) is the removal  (resection) of part of the prostate tissue. ?The goal of this procedure is to remove enough prostate tissue to allow for a normal flow of urine. ?Follow instructions from your health care provider about taking medicines and about eating and drinking before the procedure. ?This information is not intended to replace advice given to you by your health care provider. Make sure you discuss any questions you have with your health care provider. ?Document Revised: 10/10/2020 Document Reviewed: 10/10/2020 ?Elsevier Patient Education ? 2023 [employer] ? ?

## 2022-03-24 NOTE — Progress Notes (Deleted)
Referring Provider: Carrolyn Meiers, MD Primary Care Physician:  Carrolyn Meiers, MD Primary Gastroenterologist:  Dr. Rayne Du chief complaint on file.   HPI:   Bertie Rao is a 77 y.o. male presenting today at the request of Fanta, Normajean Baxter, MD for rectal bleeding.     Past Medical History:  Diagnosis Date   Ascending aorta dilation (HCC)    Atrial flutter (HCC)    AV dissociation    Bipolar disorder ([place])    Coronary artery disease    Depression    Essential hypertension    Hyperlipidemia LDL goal <70    Hypertension    RBBB    Second degree AV block, Mobitz type I     Past Surgical History:  Procedure Laterality Date   KNEE SURGERY     with replacement on right knee   LEFT HEART CATH AND CORONARY ANGIOGRAPHY N/A 07/18/2021   Procedure: LEFT HEART CATH AND CORONARY ANGIOGRAPHY;  Surgeon: Sherren Mocha, MD;  Location: [facility];  Service: Cardiovascular;  Laterality: N/A;   PACEMAKER IMPLANT N/A 12/31/2021   Procedure: PACEMAKER IMPLANT;  Surgeon: Evans Lance, MD;  Location: [facility];  Service: Cardiovascular;  Laterality: N/A;    Current Outpatient Medications  Medication Sig Dispense Refill   acetaminophen (TYLENOL) 650 MG CR tablet Take 1,300 mg by mouth as needed for pain.     atorvastatin (LIPITOR) 40 MG tablet Take 1 tablet (40 mg total) by mouth daily. 90 tablet 3   bacitracin ointment Apply topically 2 (two) times daily. (Patient taking differently: Apply topically as needed.) 120 g 0   lisinopril (ZESTRIL) 5 MG tablet Take 1 tablet (5 mg total) by mouth daily. 30 tablet 0   Multiple Vitamin (MULTIVITAMIN WITH MINERALS) TABS tablet Take 1 tablet by mouth daily.     nitroGLYCERIN (NITROSTAT) 0.4 MG SL tablet Place 1 tablet (0.4 mg total) under the tongue every 5 (five) minutes as needed for chest pain. 25 tablet 3   rivaroxaban (XARELTO) 20 MG TABS tablet Take 1 tablet (20 mg total) by mouth daily with supper.  90 tablet 1   venlafaxine XR (EFFEXOR-XR) 150 MG 24 hr capsule Take 1 capsule (150 mg total) by mouth daily. 30 capsule 0   ziprasidone (GEODON) 20 MG capsule Take 1 capsule (20 mg total) by mouth daily. 30 capsule 0   No current facility-administered medications for this visit.    Allergies as of 03/27/2022   (No Known Allergies)    Family History  Problem Relation Age of Onset   Alzheimer's disease Neg Hx    Parkinson's disease Neg Hx     Social History   Socioeconomic History   Marital status: Widowed    Spouse name: Not on file   Number of children: Not on file   Years of education: Not on file   Highest education level: Not on file  Occupational History   Not on file  Tobacco Use   Smoking status: Never    Passive exposure: Never   Smokeless tobacco: Never  Vaping Use   Vaping Use: Never used  Substance and Sexual Activity   Alcohol use: Yes    Comment: occasional   Drug use: No   Sexual activity: Not on file  Other Topics Concern   Not on file  Social History Narrative   Not on file   Social Determinants of Health   Financial Resource Strain: Not on file  Food  Insecurity: Not on file  Transportation Needs: Not on file  Physical Activity: Not on file  Stress: Not on file  Social Connections: Not on file  Intimate Partner Violence: Not on file    Review of Systems: Gen: Denies any fever, chills, fatigue, weight loss, lack of appetite.  CV: Denies chest pain, heart palpitations, peripheral edema, syncope.  Resp: Denies shortness of breath at rest or with exertion. Denies wheezing or cough.  GI: Denies dysphagia or odynophagia. Denies jaundice, hematemesis, fecal incontinence. GU : Denies urinary burning, urinary frequency, urinary hesitancy MS: Denies joint pain, muscle weakness, cramps, or limitation of movement.  Derm: Denies rash, itching, dry skin Psych: Denies depression, anxiety, memory loss, and confusion Heme: Denies bruising, bleeding, and  enlarged lymph nodes.  Physical Exam: There were no vitals taken for this visit. General:   Alert and oriented. Pleasant and cooperative. Well-nourished and well-developed.  Head:  Normocephalic and atraumatic. Eyes:  Without icterus, sclera clear and conjunctiva pink.  Ears:  Normal auditory acuity. Lungs:  Clear to auscultation bilaterally. No wheezes, rales, or rhonchi. No distress.  Heart:  S1, S2 present without murmurs appreciated.  Abdomen:  +BS, soft, non-tender and non-distended. No HSM noted. No guarding or rebound. No masses appreciated.  Rectal:  Deferred  Msk:  Symmetrical without gross deformities. Normal posture. Extremities:  Without edema. Neurologic:  Alert and  oriented x4;  grossly normal neurologically. Skin:  Intact without significant lesions or rashes. Psych:  Alert and cooperative. Normal mood and affect.    Assessment:     Plan:  ***   Aliene Altes, PA-C [facility] 03/27/2022

## 2022-03-25 ENCOUNTER — Ambulatory Visit: Payer: Medicare Other | Admitting: Neurology

## 2022-03-26 ENCOUNTER — Encounter: Payer: Self-pay | Admitting: Internal Medicine

## 2022-03-26 ENCOUNTER — Ambulatory Visit: Payer: No Typology Code available for payment source | Attending: Internal Medicine | Admitting: Internal Medicine

## 2022-03-26 VITALS — BP 124/60 | HR 70 | Ht 72.0 in | Wt 253.0 lb

## 2022-03-26 DIAGNOSIS — I4819 Other persistent atrial fibrillation: Secondary | ICD-10-CM

## 2022-03-26 DIAGNOSIS — I4892 Unspecified atrial flutter: Secondary | ICD-10-CM | POA: Diagnosis not present

## 2022-03-26 LAB — CUP PACEART INCLINIC DEVICE CHECK
Battery Remaining Longevity: 99 mo
Battery Voltage: 3.01 V
Brady Statistic RA Percent Paced: 20 %
Brady Statistic RV Percent Paced: 97 %
Date Time Interrogation Session: 20240227122247
Implantable Lead Connection Status: 753985
Implantable Lead Connection Status: 753985
Implantable Lead Implant Date: 20231204
Implantable Lead Implant Date: 20231204
Implantable Lead Location: 753859
Implantable Lead Location: 753860
Implantable Pulse Generator Implant Date: 20231204
Lead Channel Impedance Value: 475 Ohm
Lead Channel Impedance Value: 475 Ohm
Lead Channel Pacing Threshold Amplitude: 0.5 V
Lead Channel Pacing Threshold Amplitude: 0.5 V
Lead Channel Pacing Threshold Pulse Width: 0.5 ms
Lead Channel Pacing Threshold Pulse Width: 0.5 ms
Lead Channel Sensing Intrinsic Amplitude: 10.6 mV
Lead Channel Sensing Intrinsic Amplitude: 2.1 mV
Lead Channel Setting Pacing Amplitude: 2 V
Lead Channel Setting Pacing Amplitude: 2.5 V
Lead Channel Setting Pacing Pulse Width: 0.5 ms
Lead Channel Setting Sensing Sensitivity: 0.5 mV
Pulse Gen Model: 2272
Pulse Gen Serial Number: 8111795

## 2022-03-26 MED ORDER — WARFARIN SODIUM 5 MG PO TABS: 5.0000 mg | ORAL_TABLET | Freq: Every day | ORAL | 3 refills | Status: DC

## 2022-03-26 NOTE — Patient Instructions (Signed)
Medication Instructions:   Take Warfarin 5 mg with Xarelto for 3 days then stop taking Xarelto and continue Warfarin.  Follow up with Dr. Legrand Rams for INR check in 1 week.    *If you need a refill on your cardiac medications before your next appointment, please call your pharmacy*   Lab Work: NONE   If you have labs (blood work) drawn today and your tests are completely normal, you will receive your results only by: [place] (if you have MyChart) OR A paper copy in the mail If you have any lab test that is abnormal or we need to change your treatment, we will call you to review the results.   Testing/Procedures: NONE    Follow-Up: At [facility], you and your health needs are our priority.  As part of our continuing mission to provide you with exceptional heart care, we have created designated Provider Care Teams.  These Care Teams include your primary Cardiologist (physician) and Advanced Practice Providers (APPs -  Physician Assistants and Nurse Practitioners) who all work together to provide you with the care you need, when you need it.  We recommend signing up for the patient portal called "MyChart".  Sign up information is provided on this After Visit Summary.  MyChart is used to connect with patients for Virtual Visits (Telemedicine).  Patients are able to view lab/test results, encounter notes, upcoming appointments, etc.  Non-urgent messages can be sent to your provider as well.   To learn more about what you can do with MyChart, go to [email or web address].    Your next appointment:   1 year(s)  Provider:   Cristopher Peru, MD    Other Instructions Thank you for choosing [facility]!

## 2022-03-26 NOTE — Progress Notes (Signed)
HPI [name] returns today for ongoing evaluation s/p PPM insertion. He is a pleasant 77 yo man with AFlutter and intermittent CHB. He also has mild dementia. He denies a h/o syncope. No chest pain or sob. He has periods of weakness which I suspect is due to his bradycardia. These have improved.  He has not been taking his xarelto as prescribed as it is too expensive. He is out of rhythm about 50% since his PPM and it is ongoing. He has class 2 dyspnea.   No Known Allergies   Current Outpatient Medications  Medication Sig Dispense Refill   acetaminophen (TYLENOL) 650 MG CR tablet Take 1,300 mg by mouth as needed for pain.     atorvastatin (LIPITOR) 40 MG tablet Take 1 tablet (40 mg total) by mouth daily. 90 tablet 3   bacitracin ointment Apply topically 2 (two) times daily. (Patient taking differently: Apply topically as needed.) 120 g 0   lisinopril (ZESTRIL) 5 MG tablet Take 1 tablet (5 mg total) by mouth daily. 30 tablet 0   Multiple Vitamin (MULTIVITAMIN WITH MINERALS) TABS tablet Take 1 tablet by mouth daily.     nitroGLYCERIN (NITROSTAT) 0.4 MG SL tablet Place 1 tablet (0.4 mg total) under the tongue every 5 (five) minutes as needed for chest pain. 25 tablet 3   rivaroxaban (XARELTO) 20 MG TABS tablet Take 1 tablet (20 mg total) by mouth daily with supper. (Patient taking differently: Take 20 mg by mouth every other day.) 90 tablet 1   venlafaxine XR (EFFEXOR-XR) 150 MG 24 hr capsule Take 1 capsule (150 mg total) by mouth daily. 30 capsule 0   warfarin (COUMADIN) 5 MG tablet Take 1 tablet (5 mg total) by mouth daily. 30 tablet 3   ziprasidone (GEODON) 20 MG capsule Take 1 capsule (20 mg total) by mouth daily. 30 capsule 0   No current facility-administered medications for this visit.     Past Medical History:  Diagnosis Date   Ascending aorta dilation (HCC)    Atrial flutter (HCC)    AV dissociation    Bipolar disorder ([place])    Coronary artery disease    Depression     Essential hypertension    Hyperlipidemia LDL goal <70    Hypertension    RBBB    Second degree AV block, Mobitz type I     ROS:   All systems reviewed and negative except as noted in the HPI.   Past Surgical History:  Procedure Laterality Date   KNEE SURGERY     with replacement on right knee   LEFT HEART CATH AND CORONARY ANGIOGRAPHY N/A 07/18/2021   Procedure: LEFT HEART CATH AND CORONARY ANGIOGRAPHY;  Surgeon: Sherren Mocha, MD;  Location: [place] [facility];  Service: Cardiovascular;  Laterality: N/A;   PACEMAKER IMPLANT N/A 12/31/2021   Procedure: PACEMAKER IMPLANT;  Surgeon: Evans Lance, MD;  Location: [place];  Service: Cardiovascular;  Laterality: N/A;     Family History  Problem Relation Age of Onset   Alzheimer's disease Neg Hx    Parkinson's disease Neg Hx      Social History   Socioeconomic History   Marital status: Widowed    Spouse name: Not on file   Number of children: Not on file   Years of education: Not on file   Highest education level: Not on file  Occupational History   Not on file  Tobacco Use   Smoking status: Never  Passive exposure: Never   Smokeless tobacco: Never  Vaping Use   Vaping Use: Never used  Substance and Sexual Activity   Alcohol use: Yes    Comment: occasional   Drug use: No   Sexual activity: Not on file  Other Topics Concern   Not on file  Social History Narrative   Not on file   Social Determinants of Health   Financial Resource Strain: Not on file  Food Insecurity: Not on file  Transportation Needs: Not on file  Physical Activity: Not on file  Stress: Not on file  Social Connections: Not on file  Intimate Partner Violence: Not on file     BP 124/60   Pulse 70   Ht 6' (1.829 m)   Wt 253 lb (114.8 kg)   SpO2 96%   BMI 34.31 kg/m   Physical Exam:  Chronically ill appearing NAD HEENT: Unremarkable Neck:  No JVD, no thyromegally Lymphatics:  No adenopathy Back:  No CVA  tenderness Lungs:  Clear with scattered rales but no increased work of breathing. HEART:  Regular rate rhythm, no murmurs, no rubs, no clicks Abd:  soft, positive bowel sounds, no organomegally, no rebound, no guarding Ext:  2 plus pulses, no edema, no cyanosis, no clubbing Skin:  No rashes no nodules Neuro:  CN II through XII intact, motor grossly intact  EKG - atypical atrial flutter  DEVICE  Normal device function.  See PaceArt for details.   Assess/Plan:  CHB - He is asymptomatic s/p PPM insertion. 2. PAF - he is in and out of rhythm. Mostly out. He has not been taking the xarelto correctly due to cost and I have asked him to start warfarin 5 mg daily, and stop the xarelto in 3 days and then followup in a week with his primary MD for a check of the INR. 3. Coags - he has not been bleeding but is not taking his meds correctly. 4. PPM - his [employer] DDD PM is working normally.   Mario Sinner Zilah Villaflor,MD

## 2022-03-27 ENCOUNTER — Ambulatory Visit: Payer: No Typology Code available for payment source | Admitting: Gastroenterology

## 2022-03-28 ENCOUNTER — Telehealth: Payer: Self-pay | Admitting: *Deleted

## 2022-03-28 ENCOUNTER — Telehealth: Payer: Self-pay

## 2022-03-28 ENCOUNTER — Other Ambulatory Visit: Payer: Self-pay

## 2022-03-28 NOTE — Telephone Encounter (Signed)
I spoke with [place], daughter. We have discussed possible surgery dates and 04/15/2022 was agreed upon by all parties. Patient given information about surgery date, what to expect pre-operatively and post operatively.    We discussed that a pre-op nurse will be calling to set up the pre-op visit that will take place prior to surgery. Informed patient that our office will communicate any additional care to be provided after surgery.    Patients questions or concerns were discussed during our call. Advised to call our office should there be any additional information, questions or concerns that arise. Patient verbalized understanding.    Cardiology clearance

## 2022-03-28 NOTE — Telephone Encounter (Signed)
   Pre-operative Risk Assessment    Patient Name: Mario Massey  DOB: 06/20/1945 MRN: [id number]      Request for Surgical Clearance    Procedure:   Transurethral Resection Prostate  Date of Surgery:  Clearance 04/15/22                                 Surgeon:  Dr. Nicolette Bang Surgeon's Group or Practice Name:  [facility] Phone number:  [phone] Fax number:  [phone]   Type of Clearance Requested:   - Medical  - Pharmacy:  Hold Warfarin (Coumadin) 5 days   Type of Anesthesia:  General    Additional requests/questions:    Signed, Greer Ee   03/28/2022, 12:42 PM

## 2022-03-29 ENCOUNTER — Telehealth: Payer: Self-pay | Admitting: *Deleted

## 2022-03-29 NOTE — Telephone Encounter (Signed)
DPR ok to s/w pt's daughter POA. Pt has been scheduled for tele visit 04/03/22 @ 2:20. Med rec and consent are done.

## 2022-03-29 NOTE — Telephone Encounter (Signed)
Patient with diagnosis of atrial flutter on warfarin for anticoagulation.    Procedure: TURP Date of procedure: 04/15/22   CHA2DS2-VASc Score = 4   This indicates a 4.8% annual risk of stroke. The patient's score is based upon: CHF History: 0 HTN History: 1 Diabetes History: 0 Stroke History: 0 Vascular Disease History: 1 Age Score: 2 Gender Score: 0   CrCl 111 Platelet count 155  Per office protocol, patient can hold warfarin for 5 days prior to procedure.   Patient will not need bridging with Lovenox (enoxaparin) around procedure.  **This guidance is not considered finalized until pre-operative APP has relayed final recommendations.**

## 2022-03-29 NOTE — Telephone Encounter (Signed)
DPR ok to s/w pt's daughter POA. Pt has been scheduled for tele visit 04/03/22 @ 2:20. Med rec and consent are done.      Patient Consent for Virtual Visit        Mario Massey has provided verbal consent on 03/29/2022 for a virtual visit (video or telephone).   CONSENT FOR VIRTUAL VISIT FOR:  Mario Massey  By participating in this virtual visit I agree to the following:  I hereby voluntarily request, consent and authorize [facility] and its employed or contracted physicians, physician assistants, nurse practitioners or other licensed health care professionals (the Practitioner), to provide me with telemedicine health care services (the "Services") as deemed necessary by the treating Practitioner. I acknowledge and consent to receive the Services by the Practitioner via telemedicine. I understand that the telemedicine visit will involve communicating with the Practitioner through live audiovisual communication technology and the disclosure of certain medical information by electronic transmission. I acknowledge that I have been given the opportunity to request an in-person assessment or other available alternative prior to the telemedicine visit and am voluntarily participating in the telemedicine visit.  I understand that I have the right to withhold or withdraw my consent to the use of telemedicine in the course of my care at any time, without affecting my right to future care or treatment, and that the Practitioner or I may terminate the telemedicine visit at any time. I understand that I have the right to inspect all information obtained and/or recorded in the course of the telemedicine visit and may receive copies of available information for a reasonable fee.  I understand that some of the potential risks of receiving the Services via telemedicine include:  Delay or interruption in medical evaluation due to technological equipment failure or disruption; Information transmitted  may not be sufficient (e.g. poor resolution of images) to allow for appropriate medical decision making by the Practitioner; and/or  In rare instances, security protocols could fail, causing a breach of personal health information.  Furthermore, I acknowledge that it is my responsibility to provide information about my medical history, conditions and care that is complete and accurate to the best of my ability. I acknowledge that Practitioner's advice, recommendations, and/or decision may be based on factors not within their control, such as incomplete or inaccurate data provided by me or distortions of diagnostic images or specimens that may result from electronic transmissions. I understand that the practice of medicine is not an exact science and that Practitioner makes no warranties or guarantees regarding treatment outcomes. I acknowledge that a copy of this consent can be made available to me via my patient portal ([place]), or I can request a printed copy by calling the office of [facility].    I understand that my insurance will be billed for this visit.   I have read or had this consent read to me. I understand the contents of this consent, which adequately explains the benefits and risks of the Services being provided via telemedicine.  I have been provided ample opportunity to ask questions regarding this consent and the Services and have had my questions answered to my satisfaction. I give my informed consent for the services to be provided through the use of telemedicine in my medical care

## 2022-03-29 NOTE — Telephone Encounter (Signed)
   Name: Mario Massey  DOB: [birth year]  MRN: [id number]  Primary Cardiologist: None  Chart reviewed as part of pre-operative protocol coverage. Because of [name]'s past medical history and time since last visit, he will require a follow-up telephone visit in order to better assess preoperative cardiovascular risk.  Pre-op covering staff: - Please schedule appointment and call patient to inform them. If patient already had an upcoming appointment within acceptable timeframe, please add "pre-op clearance" to the appointment notes so provider is aware. - Please contact requesting surgeon's office via preferred method (i.e, phone, fax) to inform them of need for appointment prior to surgery.  Per office protocol, patient can hold warfarin for 5 days prior to procedure.   Patient will not need bridging with Lovenox (enoxaparin) around procedure.  Elgie Collard, PA-C  03/29/2022, 7:49 AM

## 2022-04-02 ENCOUNTER — Encounter (HOSPITAL_COMMUNITY): Payer: Self-pay

## 2022-04-02 ENCOUNTER — Other Ambulatory Visit: Payer: Self-pay

## 2022-04-02 ENCOUNTER — Ambulatory Visit (INDEPENDENT_AMBULATORY_CARE_PROVIDER_SITE_OTHER): Payer: No Typology Code available for payment source

## 2022-04-02 ENCOUNTER — Emergency Department (HOSPITAL_COMMUNITY): Payer: No Typology Code available for payment source

## 2022-04-02 ENCOUNTER — Emergency Department (HOSPITAL_COMMUNITY)
Admission: EM | Admit: 2022-04-02 | Discharge: 2022-04-02 | Disposition: A | Discharge status: Home / Self Care | Payer: No Typology Code available for payment source | Attending: Emergency Medicine | Admitting: Emergency Medicine

## 2022-04-02 DIAGNOSIS — I442 Atrioventricular block, complete: Secondary | ICD-10-CM

## 2022-04-02 DIAGNOSIS — N390 Urinary tract infection, site not specified: Secondary | ICD-10-CM | POA: Diagnosis not present

## 2022-04-02 DIAGNOSIS — R300 Dysuria: Secondary | ICD-10-CM | POA: Diagnosis present

## 2022-04-02 LAB — BASIC METABOLIC PANEL
Anion gap: 6 (ref 5–15)
BUN: 27 mg/dL — ABNORMAL HIGH (ref 8–23)
CO2: 26 mmol/L (ref 22–32)
Calcium: 8.9 mg/dL (ref 8.9–10.3)
Chloride: 104 mmol/L (ref 98–111)
Creatinine, Ser: 0.8 mg/dL (ref 0.61–1.24)
GFR, Estimated: >60 mL/min (ref >60)
Glucose, Bld: 93 mg/dL (ref 70–99)
Potassium: 4.6 mmol/L (ref 3.5–5.1)
Sodium: 136 mmol/L (ref 135–145)

## 2022-04-02 LAB — URINALYSIS, ROUTINE W REFLEX MICROSCOPIC
Bilirubin Urine: NEGATIVE
Glucose, UA: NEGATIVE mg/dL
Ketones, ur: NEGATIVE mg/dL
Nitrite: NEGATIVE
Protein, ur: 100 mg/dL — AB
Specific Gravity, Urine: 1.018 (ref 1.005–1.030)
WBC, UA: >50 WBC/hpf (ref 0–5)
pH: 6 (ref 5.0–8.0)

## 2022-04-02 LAB — CBC
HCT: 39.8 % (ref 39.0–52.0)
Hemoglobin: 13.3 g/dL (ref 13.0–17.0)
MCH: 30.8 pg (ref 26.0–34.0)
MCHC: 33.4 g/dL (ref 30.0–36.0)
MCV: 92.1 fL (ref 80.0–100.0)
Platelets: 149 10*3/uL — ABNORMAL LOW (ref 150–400)
RBC: 4.32 MIL/uL (ref 4.22–5.81)
RDW: 13.5 % (ref 11.5–15.5)
WBC: 8 10*3/uL (ref 4.0–10.5)
nRBC: 0 % (ref 0.0–0.2)

## 2022-04-02 MED ORDER — CEPHALEXIN 500 MG PO CAPS
500.0000 mg | ORAL_CAPSULE | Freq: Once | ORAL | Status: AC
  Administered 2022-04-02: 500 mg via ORAL
  Filled 2022-04-02: qty 1

## 2022-04-02 MED ORDER — CEPHALEXIN 500 MG PO CAPS: 500.0000 mg | ORAL_CAPSULE | Freq: Two times a day (BID) | ORAL | 0 refills | Status: AC

## 2022-04-02 NOTE — Progress Notes (Unsigned)
Virtual Visit via Telephone Note   Because of Ac [name]'s co-morbid illnesses, he is at least at moderate risk for complications without adequate follow up.  This format is felt to be most appropriate for this patient at this time.  The patient did not have access to video technology/had technical difficulties with video requiring transitioning to audio format only (telephone).  All issues noted in this document were discussed and addressed.  No physical exam could be performed with this format.  Please refer to the patient's chart for his consent to telehealth for [facility].  Evaluation Performed:  Preoperative cardiovascular risk assessment _____________   Date:  04/02/2022   Patient ID:  Mario Massey, DOB [birth year], MRN [id number] Patient Location:  Home Provider location:   Office  Primary Care Provider:  Carrolyn Meiers, MD Primary Cardiologist:  None  Chief Complaint / Patient Profile   77 y.o. y/o male with a h/o atrial flutter, persistent atrial fibrillation who is pending transurethral resection prostate and presents today for telephonic preoperative cardiovascular risk assessment.  History of Present Illness    Mario Massey is a 77 y.o. male who presents via audio/video conferencing for a telehealth visit today.  Pt was last seen in cardiology clinic on 03/26/2022 by Dr. Lovena Le.  At that time Mario Massey was doing well .  The patient is now pending procedure as outlined above. Since his last visit, he remains stable from a cardiac standpoint.  Today he denies chest pain, shortness of breath, lower extremity edema, fatigue, palpitations, melena, hematuria, hemoptysis, diaphoresis, weakness, presyncope, syncope, orthopnea, and PND.   Past Medical History    Past Medical History:  Diagnosis Date   Ascending aorta dilation (HCC)    Atrial flutter (HCC)    AV dissociation    Bipolar disorder ([place])    Coronary artery disease    Depression     Essential hypertension    Hyperlipidemia LDL goal <70    Hypertension    RBBB    Second degree AV block, Mobitz type I    Past Surgical History:  Procedure Laterality Date   KNEE SURGERY     with replacement on right knee   LEFT HEART CATH AND CORONARY ANGIOGRAPHY N/A 07/18/2021   Procedure: LEFT HEART CATH AND CORONARY ANGIOGRAPHY;  Surgeon: Sherren Mocha, MD;  Location: [facility];  Service: Cardiovascular;  Laterality: N/A;   PACEMAKER IMPLANT N/A 12/31/2021   Procedure: PACEMAKER IMPLANT;  Surgeon: Evans Lance, MD;  Location: [facility];  Service: Cardiovascular;  Laterality: N/A;    Allergies  No Known Allergies  Home Medications    Prior to Admission medications   Medication Sig Start Date End Date Taking? Authorizing Provider  atorvastatin (LIPITOR) 40 MG tablet Take 1 tablet (40 mg total) by mouth daily. 01/15/22   Hilty, Nadean Corwin, MD  bacitracin ointment Apply topically 2 (two) times daily. Patient not taking: Reported on 03/29/2022 04/23/21   Raiford Noble Latif, DO  cephALEXin (KEFLEX) 500 MG capsule Take 1 capsule (500 mg total) by mouth 2 (two) times daily for 5 days. 04/02/22 04/07/22  Carmin Muskrat, MD  lisinopril (ZESTRIL) 5 MG tablet Take 1 tablet (5 mg total) by mouth daily. 05/11/21   Gerlene Fee, NP  Multiple Vitamin (MULTIVITAMIN WITH MINERALS) TABS tablet Take 1 tablet by mouth daily.    [provider]  nitroGLYCERIN (NITROSTAT) 0.4 MG SL tablet Place 1 tablet (0.4 mg total) under the tongue every  5 (five) minutes as needed for chest pain. 10/10/21   Imogene Burn, PA-C  venlafaxine XR (EFFEXOR-XR) 150 MG 24 hr capsule Take 1 capsule (150 mg total) by mouth daily. 05/11/21   Gerlene Fee, NP  warfarin (COUMADIN) 5 MG tablet Take 1 tablet (5 mg total) by mouth daily. 03/26/22   Evans Lance, MD  ziprasidone (GEODON) 20 MG capsule Take 1 capsule (20 mg total) by mouth daily. 05/11/21   Gerlene Fee, NP    Physical  Exam    Vital Signs:  Mario Massey does not have vital signs available for review today.  Given telephonic nature of communication, physical exam is limited. AAOx3. NAD. Normal affect.  Speech and respirations are unlabored.  Accessory Clinical Findings    None  Assessment & Plan    1.  Preoperative Cardiovascular Risk Assessment: TURP, Dr. Nicolette Bang, [facility]     Primary Cardiologist: Dr. Cristopher Peru  Chart reviewed as part of pre-operative protocol coverage. Given past medical history and time since last visit, based on ACC/AHA guidelines, Mario Massey would be at acceptable risk for the planned procedure without further cardiovascular testing.   His RCRI is a class IV risk, 11% risk of major cardiac event.  He is able to complete greater than 4 METS of physical activity.  Per office protocol, patient can hold warfarin for 5 days prior to procedure.   Patient will not need bridging with Lovenox (enoxaparin) around procedure.  The patient was advised that if he develops new symptoms prior to surgery to contact our office to arrange for a follow-up visit, and he verbalized understanding.  I will route this recommendation to the requesting party via Epic fax function and remove from pre-op pool.      Time:   Today, I have spent 5 minutes with the patient with telehealth technology discussing medical history, symptoms, and management plan.  Prior to his phone evaluation I spent greater than 10 minutes reviewing his past medical history and cardiac medications.   Deberah Pelton, NP  04/02/2022, 3:59 PM

## 2022-04-02 NOTE — ED Provider Notes (Signed)
[place] Provider Note   CSN: [id number] Arrival date & time: 04/02/22  1215     History  Chief Complaint  Patient presents with   Dysuria    Mario Massey is a 77 y.o. male.  HPI Patient presents with his wife provides much of the history. Patient presents with concern for ongoing perineum, penis pain.  There are some dysuria.  Patient has history notable for chronic indwelling catheter, last exchanged about 1 month ago.  No fevers, chills, nausea, vomiting, chest pain, diarrhea.  However, the patient had an episode of sepsis after somewhat similar prior presentation with pain and family is very concerned.     Home Medications Prior to Admission medications   Medication Sig Start Date End Date Taking? Authorizing Provider  atorvastatin (LIPITOR) 40 MG tablet Take 1 tablet (40 mg total) by mouth daily. 01/15/22  Yes Hilty, Nadean Corwin, MD  cephALEXin (KEFLEX) 500 MG capsule Take 1 capsule (500 mg total) by mouth 2 (two) times daily for 5 days. 04/02/22 04/07/22 Yes Carmin Muskrat, MD  lisinopril (ZESTRIL) 5 MG tablet Take 1 tablet (5 mg total) by mouth daily. 05/11/21  Yes Gerlene Fee, NP  Multiple Vitamin (MULTIVITAMIN WITH MINERALS) TABS tablet Take 1 tablet by mouth daily.   Yes [provider]  nitroGLYCERIN (NITROSTAT) 0.4 MG SL tablet Place 1 tablet (0.4 mg total) under the tongue every 5 (five) minutes as needed for chest pain. 10/10/21  Yes Imogene Burn, PA-C  venlafaxine XR (EFFEXOR-XR) 150 MG 24 hr capsule Take 1 capsule (150 mg total) by mouth daily. 05/11/21  Yes Gerlene Fee, NP  warfarin (COUMADIN) 5 MG tablet Take 1 tablet (5 mg total) by mouth daily. 03/26/22  Yes Evans Lance, MD  ziprasidone (GEODON) 20 MG capsule Take 1 capsule (20 mg total) by mouth daily. 05/11/21  Yes Gerlene Fee, NP  bacitracin ointment Apply topically 2 (two) times daily. Patient not taking: Reported on 03/29/2022 04/23/21    Kerney Elbe, DO      Allergies    Patient has no known allergies.    Review of Systems   Review of Systems  All other systems reviewed and are negative.   Physical Exam Updated Vital Signs BP (!) 157/74 (BP Location: Right Arm)   Pulse 69   Temp 97.9 F (36.6 C) (Oral)   Resp 14   Ht 6' (1.829 m)   Wt 114.8 kg   SpO2 98%   BMI 34.31 kg/m  Physical Exam Vitals and nursing note reviewed.  Constitutional:      General: He is not in acute distress.    Appearance: He is well-developed. He is obese. He is not ill-appearing or diaphoretic.  HENT:     Head: Normocephalic and atraumatic.  Eyes:     Conjunctiva/sclera: Conjunctivae normal.  Cardiovascular:     Rate and Rhythm: Normal rate and regular rhythm.  Pulmonary:     Effort: Pulmonary effort is normal. No respiratory distress.     Breath sounds: No stridor.  Abdominal:     General: There is no distension.  Skin:    General: Skin is warm and dry.  Neurological:     Mental Status: He is alert and oriented to person, place, and time.     ED Results / Procedures / Treatments   Labs (all labs ordered are listed, but only abnormal results are displayed) Labs Reviewed  CBC - Abnormal; Notable  for the following components:      Result Value   Platelets 149 (*)    All other components within normal limits  BASIC METABOLIC PANEL - Abnormal; Notable for the following components:   BUN 27 (*)    All other components within normal limits  URINALYSIS, ROUTINE W REFLEX MICROSCOPIC - Abnormal; Notable for the following components:   Color, Urine AMBER (*)    APPearance CLOUDY (*)    Hgb urine dipstick MODERATE (*)    Protein, ur 100 (*)    Leukocytes,Ua MODERATE (*)    Bacteria, UA RARE (*)    All other components within normal limits    EKG None  Radiology CT Renal Stone Study  Result Date: 04/02/2022 CLINICAL DATA:  Abdominal/flank pain, stone suspected worsening lower abd / penis pain, indwelling cath  EXAM: CT ABDOMEN AND PELVIS WITHOUT CONTRAST TECHNIQUE: Multidetector CT imaging of the abdomen and pelvis was performed following the standard protocol without IV contrast. RADIATION DOSE REDUCTION: This exam was performed according to the departmental dose-optimization program which includes automated exposure control, adjustment of the mA and/or kV according to patient size and/or use of iterative reconstruction technique. COMPARISON:  None Available. FINDINGS: Lower chest: No acute abnormality. Hepatobiliary: Cholelithiasis without evidence of acute inflammatory change. Unremarkable appearance of the liver. No evidence of biliary dilation. Pancreas: Unremarkable. No pancreatic ductal dilatation or surrounding inflammatory changes. Spleen: Normal in size without focal abnormality. Adrenals/Urinary Tract: Adrenal glands are unremarkable. Kidneys are normal, without renal calculi, focal lesion, or hydronephrosis. Bladder is decompressed around a Foley catheter. Small contained fluid collection versus diverticulum arising from the bladder is unchanged. Stomach/Bowel: Unremarkable appearance of the stomach. No dilated bowel loops to suggest obstruction. Diverticulosis without evidence diverticulitis. No findings to suggest appendicitis. Vascular/Lymphatic: Aortic atherosclerosis. No enlarged abdominal or pelvic lymph nodes. Reproductive: Prostate is unremarkable. Other: Unchanged umbilical hernia which contains fat. No abdominopelvic ascites. Musculoskeletal: Multilevel degenerative change. IMPRESSION: 1. No evidence of acute abnormality. 2. Cholelithiasis. 3. Diverticulosis. 4. Small contained fluid collection versus diverticulum arising from the bladder is unchanged. Electronically Signed   By: Margaretha Sheffield M.D.   On: 04/02/2022 13:32    Procedures Procedures    Medications Ordered in ED Medications  cephALEXin (KEFLEX) capsule 500 mg (has no administration in time range)    ED Course/ Medical  Decision Making/ A&P                             Medical Decision Making Obese elderly male with chronic indwelling catheter history of sepsis presents with penis and abdominal pain.  Differential includes infection, bacteremia, sepsis, obstruction, pain from chronic catheter.  Patient placed on monitors, labs urinalysis CT scan ordered.   Amount and/or Complexity of Data Reviewed Independent Historian:     Details: Daughter at bedside External Data Reviewed: notes.    Details: Sepsis Labs: ordered. Decision-making details documented in ED Course. Radiology: ordered and independent interpretation performed. Decision-making details documented in ED Course.  Risk Prescription drug management. Decision regarding hospitalization.   2:14 PM Patient in no distress, awake, alert.  No obvious excoriation at the tip of the penis.  He remains hemodynamically unremarkable I discussed all findings with the patient and his daughter, evidence for urinary tract infection, no evidence for bacteremia, sepsis.  CT scan generally reassuring.  She, daughter both comfortable with discharge and outpatient follow-up.        Final Clinical Impression(s) / ED  Diagnoses Final diagnoses:  Lower urinary tract infectious disease    Rx / DC Orders ED Discharge Orders          Ordered    cephALEXin (KEFLEX) 500 MG capsule  2 times daily        04/02/22 1414              Carmin Muskrat, MD 04/02/22 1414

## 2022-04-02 NOTE — ED Notes (Signed)
P[ has home foley cath for 4 years

## 2022-04-02 NOTE — Discharge Instructions (Addendum)
As discussed, your evaluation today has been largely reassuring.  But, it is important that you monitor your condition carefully, and do not hesitate to return to the ED if you develop new, or concerning changes in your condition. ? ?Otherwise, please follow-up with your physician for appropriate ongoing care. ? ?

## 2022-04-02 NOTE — ED Triage Notes (Signed)
Pt has a catheter in place and c/o pain and burning since this morning.   Pt has hx of UTI.

## 2022-04-02 NOTE — ED Notes (Signed)
Patient transported to CT 

## 2022-04-03 ENCOUNTER — Ambulatory Visit: Payer: No Typology Code available for payment source | Attending: Internal Medicine

## 2022-04-03 DIAGNOSIS — Z01810 Encounter for preprocedural cardiovascular examination: Secondary | ICD-10-CM | POA: Diagnosis not present

## 2022-04-03 LAB — CUP PACEART REMOTE DEVICE CHECK
Battery Remaining Longevity: 102 mo
Battery Remaining Percentage: 95.5 %
Battery Voltage: 3.01 V
Brady Statistic AP VP Percent: 0 %
Brady Statistic AP VS Percent: 0 %
Brady Statistic AS VP Percent: 0 %
Brady Statistic AS VS Percent: 0 %
Brady Statistic RA Percent Paced: 0 %
Brady Statistic RV Percent Paced: 97 %
Date Time Interrogation Session: 20240305040014
Implantable Lead Connection Status: 753985
Implantable Lead Connection Status: 753985
Implantable Lead Implant Date: 20231204
Implantable Lead Implant Date: 20231204
Implantable Lead Location: 753859
Implantable Lead Location: 753860
Implantable Pulse Generator Implant Date: 20231204
Lead Channel Impedance Value: 440 Ohm
Lead Channel Impedance Value: 450 Ohm
Lead Channel Pacing Threshold Amplitude: 0.5 V
Lead Channel Pacing Threshold Amplitude: 0.75 V
Lead Channel Pacing Threshold Pulse Width: 0.5 ms
Lead Channel Pacing Threshold Pulse Width: 0.5 ms
Lead Channel Sensing Intrinsic Amplitude: 11.4 mV
Lead Channel Sensing Intrinsic Amplitude: 2.1 mV
Lead Channel Setting Pacing Amplitude: 2 V
Lead Channel Setting Pacing Amplitude: 2.5 V
Lead Channel Setting Pacing Pulse Width: 0.5 ms
Lead Channel Setting Sensing Sensitivity: 0.5 mV
Pulse Gen Model: 2272
Pulse Gen Serial Number: 8111795

## 2022-04-05 NOTE — Telephone Encounter (Signed)
I called and discussed cardiology approval for Warfarin hold 5 days prior with daughter. Voiced understanding.

## 2022-04-08 ENCOUNTER — Encounter: Payer: Self-pay | Admitting: Internal Medicine

## 2022-04-08 NOTE — Progress Notes (Signed)
[facility] DEVICE PROGRAMMING  Patient Information: Name:  [employer]  DOB:  [birth year]  MRN:  [id number]  Planned Procedure:  TURP Surgeon:  Dr. Alyson Ingles Date of Procedure: 04/15/2022 Position during surgery:  supine Cautery will be used.   Device Information:  Clinic EP Physician:  Cristopher Peru, MD   Device Type:  Pacemaker Manufacturer and Phone #:  [employer]: [phone] Pacemaker Dependent?:  No. Date of Last Device Check:  04/02/2022 Normal Device Function?:  Yes.    Electrophysiologist's Recommendations:  Have magnet available. Provide continuous ECG monitoring when magnet is used or reprogramming is to be performed.  Procedure should not interfere with device function.  No device programming or magnet placement needed.  Per Device Clinic Standing Orders, Damian Leavell, RN  5:04 PM 04/08/2022

## 2022-04-10 ENCOUNTER — Encounter (HOSPITAL_COMMUNITY)
Admission: RE | Admit: 2022-04-10 | Discharge: 2022-04-10 | Disposition: A | Discharge status: Home / Self Care | Payer: No Typology Code available for payment source | Source: Ambulatory Visit | Attending: Urology | Admitting: Urology

## 2022-04-10 HISTORY — DX: Presence of other specified devices: Z97.8

## 2022-04-10 HISTORY — DX: Unspecified dementia, unspecified severity, without behavioral disturbance, psychotic disturbance, mood disturbance, and anxiety: F03.90

## 2022-04-10 HISTORY — DX: Presence of cardiac pacemaker: Z95.0

## 2022-04-10 HISTORY — DX: Acute myocardial infarction, unspecified: I21.9

## 2022-04-12 ENCOUNTER — Other Ambulatory Visit: Payer: Self-pay

## 2022-04-12 ENCOUNTER — Encounter (HOSPITAL_COMMUNITY): Payer: Self-pay

## 2022-04-15 ENCOUNTER — Observation Stay (HOSPITAL_COMMUNITY)
Admission: RE | Admit: 2022-04-15 | Discharge: 2022-04-16 | Disposition: A | Discharge status: Home / Self Care | Payer: No Typology Code available for payment source | Attending: Urology | Admitting: Urology

## 2022-04-15 ENCOUNTER — Encounter (HOSPITAL_COMMUNITY): Payer: Self-pay | Admitting: Urology

## 2022-04-15 ENCOUNTER — Ambulatory Visit (HOSPITAL_COMMUNITY): Payer: No Typology Code available for payment source | Admitting: Anesthesiology

## 2022-04-15 ENCOUNTER — Ambulatory Visit (HOSPITAL_BASED_OUTPATIENT_CLINIC_OR_DEPARTMENT_OTHER): Payer: No Typology Code available for payment source | Admitting: Anesthesiology

## 2022-04-15 ENCOUNTER — Encounter (HOSPITAL_COMMUNITY)
Admission: RE | Disposition: A | Discharge status: Home / Self Care | Payer: Self-pay | Source: Home / Self Care | Attending: Urology

## 2022-04-15 ENCOUNTER — Other Ambulatory Visit: Payer: Self-pay

## 2022-04-15 DIAGNOSIS — N401 Enlarged prostate with lower urinary tract symptoms: Secondary | ICD-10-CM | POA: Diagnosis not present

## 2022-04-15 DIAGNOSIS — Z950 Presence of cardiac pacemaker: Secondary | ICD-10-CM | POA: Insufficient documentation

## 2022-04-15 DIAGNOSIS — R338 Other retention of urine: Secondary | ICD-10-CM | POA: Insufficient documentation

## 2022-04-15 DIAGNOSIS — R339 Retention of urine, unspecified: Secondary | ICD-10-CM

## 2022-04-15 DIAGNOSIS — I252 Old myocardial infarction: Secondary | ICD-10-CM | POA: Diagnosis not present

## 2022-04-15 DIAGNOSIS — I10 Essential (primary) hypertension: Secondary | ICD-10-CM

## 2022-04-15 DIAGNOSIS — F0390 Unspecified dementia without behavioral disturbance: Secondary | ICD-10-CM | POA: Insufficient documentation

## 2022-04-15 DIAGNOSIS — I2510 Atherosclerotic heart disease of native coronary artery without angina pectoris: Secondary | ICD-10-CM | POA: Insufficient documentation

## 2022-04-15 DIAGNOSIS — Z79899 Other long term (current) drug therapy: Secondary | ICD-10-CM | POA: Insufficient documentation

## 2022-04-15 DIAGNOSIS — N138 Other obstructive and reflux uropathy: Secondary | ICD-10-CM

## 2022-04-15 DIAGNOSIS — I25119 Atherosclerotic heart disease of native coronary artery with unspecified angina pectoris: Secondary | ICD-10-CM

## 2022-04-15 HISTORY — PX: TRANSURETHRAL RESECTION OF PROSTATE: SHX73

## 2022-04-15 HISTORY — PX: CYSTOSCOPY: SHX5120

## 2022-04-15 LAB — CBC
HCT: 41.6 % (ref 39.0–52.0)
Hemoglobin: 13.5 g/dL (ref 13.0–17.0)
MCH: 30.9 pg (ref 26.0–34.0)
MCHC: 32.5 g/dL (ref 30.0–36.0)
MCV: 95.2 fL (ref 80.0–100.0)
Platelets: 152 10*3/uL (ref 150–400)
RBC: 4.37 MIL/uL (ref 4.22–5.81)
RDW: 13.2 % (ref 11.5–15.5)
WBC: 8 10*3/uL (ref 4.0–10.5)
nRBC: 0 % (ref 0.0–0.2)

## 2022-04-15 LAB — BASIC METABOLIC PANEL
Anion gap: 8 (ref 5–15)
BUN: 25 mg/dL — ABNORMAL HIGH (ref 8–23)
CO2: 28 mmol/L (ref 22–32)
Calcium: 8.5 mg/dL — ABNORMAL LOW (ref 8.9–10.3)
Chloride: 102 mmol/L (ref 98–111)
Creatinine, Ser: 0.86 mg/dL (ref 0.61–1.24)
GFR, Estimated: >60 mL/min (ref >60)
Glucose, Bld: 104 mg/dL — ABNORMAL HIGH (ref 70–99)
Potassium: 4.2 mmol/L (ref 3.5–5.1)
Sodium: 138 mmol/L (ref 135–145)

## 2022-04-15 SURGERY — CYSTOSCOPY
Anesthesia: General | Site: Prostate

## 2022-04-15 MED ORDER — OXYCODONE HCL 5 MG PO TABS
5.0000 mg | ORAL_TABLET | ORAL | Status: DC | PRN
  Administered 2022-04-15 – 2022-04-16 (×2): 5 mg via ORAL
  Filled 2022-04-15 (×3): qty 1

## 2022-04-15 MED ORDER — DIPHENHYDRAMINE HCL 50 MG/ML IJ SOLN: 12.5000 mg | Freq: Four times a day (QID) | INTRAMUSCULAR | Status: DC | PRN

## 2022-04-15 MED ORDER — DIPHENHYDRAMINE HCL 12.5 MG/5ML PO ELIX: 12.5000 mg | ORAL_SOLUTION | Freq: Four times a day (QID) | ORAL | Status: DC | PRN

## 2022-04-15 MED ORDER — VENLAFAXINE HCL ER 75 MG PO CP24: 150.0000 mg | ORAL_CAPSULE | Freq: Every day | ORAL | Status: DC

## 2022-04-15 MED ORDER — INFLUENZA VAC A&B SA ADJ QUAD 0.5 ML IM PRSY
0.5000 mL | PREFILLED_SYRINGE | INTRAMUSCULAR | Status: DC
  Filled 2022-04-15: qty 0.5

## 2022-04-15 MED ORDER — ONDANSETRON HCL 4 MG/2ML IJ SOLN
INTRAMUSCULAR | Status: AC
  Filled 2022-04-15: qty 2

## 2022-04-15 MED ORDER — CHLORHEXIDINE GLUCONATE 0.12 % MT SOLN
OROMUCOSAL | Status: AC
  Filled 2022-04-15: qty 15

## 2022-04-15 MED ORDER — CHLORHEXIDINE GLUCONATE 0.12 % MT SOLN
15.0000 mL | Freq: Once | OROMUCOSAL | Status: AC
  Administered 2022-04-15: 15 mL via OROMUCOSAL

## 2022-04-15 MED ORDER — SODIUM CHLORIDE 0.9 % IR SOLN
Status: DC | PRN
  Administered 2022-04-15 (×4): 3000 mL

## 2022-04-15 MED ORDER — PHENYLEPHRINE 80 MCG/ML (10ML) SYRINGE FOR IV PUSH (FOR BLOOD PRESSURE SUPPORT)
PREFILLED_SYRINGE | INTRAVENOUS | Status: AC
  Filled 2022-04-15: qty 10

## 2022-04-15 MED ORDER — FENTANYL CITRATE (PF) 100 MCG/2ML IJ SOLN
INTRAMUSCULAR | Status: DC | PRN
  Administered 2022-04-15 (×2): 50 ug via INTRAVENOUS

## 2022-04-15 MED ORDER — SODIUM CHLORIDE 0.9 % IV SOLN: INTRAVENOUS | Status: DC

## 2022-04-15 MED ORDER — ONDANSETRON HCL 4 MG/2ML IJ SOLN
INTRAMUSCULAR | Status: DC | PRN
  Administered 2022-04-15: 4 mg via INTRAVENOUS

## 2022-04-15 MED ORDER — SODIUM CHLORIDE 0.9 % IR SOLN
3000.0000 mL | Status: DC
  Administered 2022-04-15: 3000 mL

## 2022-04-15 MED ORDER — HYDROMORPHONE HCL 1 MG/ML IJ SOLN
0.2500 mg | INTRAMUSCULAR | Status: DC | PRN
  Administered 2022-04-15 (×2): 0.5 mg via INTRAVENOUS
  Filled 2022-04-15 (×2): qty 0.5

## 2022-04-15 MED ORDER — LISINOPRIL 10 MG PO TABS
5.0000 mg | ORAL_TABLET | Freq: Every day | ORAL | Status: DC
  Administered 2022-04-15: 5 mg via ORAL
  Filled 2022-04-15 (×2): qty 1

## 2022-04-15 MED ORDER — DIVALPROEX SODIUM ER 250 MG PO TB24
250.0000 mg | ORAL_TABLET | Freq: Every day | ORAL | Status: DC
  Filled 2022-04-15 (×2): qty 1

## 2022-04-15 MED ORDER — FENTANYL CITRATE PF 50 MCG/ML IJ SOSY: 25.0000 ug | PREFILLED_SYRINGE | INTRAMUSCULAR | Status: DC | PRN

## 2022-04-15 MED ORDER — LIDOCAINE 2% (20 MG/ML) 5 ML SYRINGE
INTRAMUSCULAR | Status: DC | PRN
  Administered 2022-04-15: 80 mg via INTRAVENOUS

## 2022-04-15 MED ORDER — STERILE WATER FOR IRRIGATION IR SOLN
Status: DC | PRN
  Administered 2022-04-15: 500 mL

## 2022-04-15 MED ORDER — SODIUM CHLORIDE 0.9 % IV SOLN
INTRAVENOUS | Status: AC
  Filled 2022-04-15: qty 20

## 2022-04-15 MED ORDER — ORAL CARE MOUTH RINSE: 15.0000 mL | Freq: Once | OROMUCOSAL | Status: AC

## 2022-04-15 MED ORDER — ROCURONIUM BROMIDE 10 MG/ML (PF) SYRINGE
PREFILLED_SYRINGE | INTRAVENOUS | Status: DC | PRN
  Administered 2022-04-15: 60 mg via INTRAVENOUS

## 2022-04-15 MED ORDER — ACETAMINOPHEN 325 MG PO TABS: 650.0000 mg | ORAL_TABLET | ORAL | Status: DC | PRN

## 2022-04-15 MED ORDER — SUGAMMADEX SODIUM 200 MG/2ML IV SOLN
INTRAVENOUS | Status: DC | PRN
  Administered 2022-04-15: 200 mg via INTRAVENOUS

## 2022-04-15 MED ORDER — ONDANSETRON HCL 4 MG/2ML IJ SOLN
4.0000 mg | INTRAMUSCULAR | Status: DC | PRN
  Filled 2022-04-15: qty 2

## 2022-04-15 MED ORDER — SENNOSIDES-DOCUSATE SODIUM 8.6-50 MG PO TABS
2.0000 | ORAL_TABLET | Freq: Every day | ORAL | Status: DC
  Administered 2022-04-15: 2 via ORAL
  Filled 2022-04-15: qty 2

## 2022-04-15 MED ORDER — FENTANYL CITRATE (PF) 100 MCG/2ML IJ SOLN
INTRAMUSCULAR | Status: AC
  Filled 2022-04-15: qty 2

## 2022-04-15 MED ORDER — ZIPRASIDONE HCL 20 MG PO CAPS: 20.0000 mg | ORAL_CAPSULE | Freq: Every day | ORAL | Status: DC

## 2022-04-15 MED ORDER — SODIUM CHLORIDE 0.9 % IV SOLN
2.0000 g | INTRAVENOUS | Status: AC
  Administered 2022-04-15: 2 g via INTRAVENOUS

## 2022-04-15 MED ORDER — LACTATED RINGERS IV SOLN: INTRAVENOUS | Status: DC

## 2022-04-15 MED ORDER — ZOLPIDEM TARTRATE 5 MG PO TABS: 5.0000 mg | ORAL_TABLET | Freq: Every evening | ORAL | Status: DC | PRN

## 2022-04-15 MED ORDER — PROPOFOL 10 MG/ML IV BOLUS
INTRAVENOUS | Status: DC | PRN
  Administered 2022-04-15: 180 mg via INTRAVENOUS

## 2022-04-15 MED ORDER — CHLORHEXIDINE GLUCONATE CLOTH 2 % EX PADS: 6.0000 | MEDICATED_PAD | Freq: Every day | CUTANEOUS | Status: DC

## 2022-04-15 MED ORDER — HYOSCYAMINE SULFATE 0.125 MG SL SUBL
0.1250 mg | SUBLINGUAL_TABLET | SUBLINGUAL | Status: DC | PRN
  Administered 2022-04-16: 0.125 mg via SUBLINGUAL
  Filled 2022-04-15: qty 1

## 2022-04-15 MED ORDER — PROPOFOL 10 MG/ML IV BOLUS
INTRAVENOUS | Status: AC
  Filled 2022-04-15: qty 20

## 2022-04-15 MED ORDER — ATORVASTATIN CALCIUM 40 MG PO TABS
40.0000 mg | ORAL_TABLET | Freq: Every day | ORAL | Status: DC
  Administered 2022-04-15: 40 mg via ORAL
  Filled 2022-04-15 (×2): qty 1

## 2022-04-15 MED ORDER — ONDANSETRON HCL 4 MG/2ML IJ SOLN: 4.0000 mg | Freq: Once | INTRAMUSCULAR | Status: DC | PRN

## 2022-04-15 SURGICAL SUPPLY — 26 items
BAG DRAIN URO TABLE W/ADPT NS (BAG) ×1 IMPLANT
BAG DRN 8 ADPR NS SKTRN CSTL (BAG) ×1
BAG DRN URN TUBE DRIP CHMBR (OSTOMY) ×1
BAG HAMPER (MISCELLANEOUS) ×1 IMPLANT
BAG URINE DRAIN TURP 4L (OSTOMY) ×1 IMPLANT
CATH FOLEY 3WAY 30CC 22F (CATHETERS) ×1 IMPLANT
CLOTH BEACON ORANGE TIMEOUT ST (SAFETY) ×1 IMPLANT
ELECT REM PT RETURN 9FT ADLT (ELECTROSURGICAL) ×1
ELECTRODE REM PT RTRN 9FT ADLT (ELECTROSURGICAL) ×1 IMPLANT
GLOVE BIO SURGEON STRL SZ8 (GLOVE) ×1 IMPLANT
GLOVE BIOGEL PI IND STRL 7.0 (GLOVE) ×2 IMPLANT
GOWN STRL REUS W/TWL LRG LVL3 (GOWN DISPOSABLE) ×2 IMPLANT
GOWN STRL REUS W/TWL XL LVL3 (GOWN DISPOSABLE) ×1 IMPLANT
IV NS IRRIG 3000ML ARTHROMATIC (IV SOLUTION) ×4 IMPLANT
KIT TURNOVER CYSTO (KITS) ×1 IMPLANT
LOOP CUT BIPOLAR 24F LRG (ELECTROSURGICAL) ×1 IMPLANT
MANIFOLD NEPTUNE II (INSTRUMENTS) ×1 IMPLANT
PACK CYSTO (CUSTOM PROCEDURE TRAY) ×1 IMPLANT
PAD ARMBOARD 7.5X6 YLW CONV (MISCELLANEOUS) ×1 IMPLANT
PLUG CATH AND CAP STER (CATHETERS) IMPLANT
SYR 30ML LL (SYRINGE) ×1 IMPLANT
SYR TOOMEY IRRIG 70ML (MISCELLANEOUS) ×1
SYRINGE TOOMEY IRRIG 70ML (MISCELLANEOUS) ×1 IMPLANT
TOWEL NATURAL 4PK STERILE (DISPOSABLE) ×1 IMPLANT
TOWEL OR 17X26 4PK STRL BLUE (TOWEL DISPOSABLE) ×1 IMPLANT
WATER STERILE IRR 500ML POUR (IV SOLUTION) ×1 IMPLANT

## 2022-04-15 NOTE — Op Note (Signed)
Preoperative diagnosis: BPH with urinary retention  Postoperative diagnosis: BPH  Procedure: 1 cystoscopy 2. Transurethral resection of the prostate  Attending: Nicolette Bang  Anesthesia: General  Estimated blood loss: Minimal  Drains: 22 French foley  Specimens: 1. Prostate Chips  Antibiotics: Rocephin  Findings: Bilobar prostate enlargement. Ureteral orifices in normal anatomic location.   Indications: Patient is a 77 year old male with a history of BPH and urinary retention requiring foley catheter.  After discussing treatment options, they decided proceed with transurethral resection of the prostate.  Procedure in detail: The patient was brought to the operating room and a brief timeout was done to ensure correct patient, correct procedure, correct site.  General anesthesia was administered patient was placed in dorsal lithotomy position.  Their genitalia was then prepped and draped in usual sterile fashion.  A rigid 34 French cystoscope was passed in the urethra and the bladder.  Bladder was inspected and we noted no masses or lesions.  the ureteral orifices were in the normal orthotopic locations. removed the cystoscope and placed a resectoscope into the bladder. We then turned our attention to the prostate resection. Using the bipolar resectoscope we resected the lateral lobes from the bladder neck to the verumontanum. We started at the 12 oclock position on the left lobe and resection to the 6 o'clock position from the bladder neck to the verumontanum. We then did the same resection of the right lobe. Once the resection was complete we then cauterized individual bleeders. We then removed the prostate chips and sent them for pathology.  We then re-inspected the prostatic fossa and found no residual bleeding.  the bladder was then drained, a 22 French foley was placed and this concluded the procedure which was well tolerated by patient.  Complications: None  Condition: Stable,  extubated, transferred to PACU  Plan: Patient is admitted overnight with continuous bladder irrigation. If their urine is clear tomorrow they will be discharged home and followup in 5 days for foley catheter removal and pathology discussion.

## 2022-04-15 NOTE — Progress Notes (Signed)
Called [phone] to give report and was told the bed still needed to be approved.  Reminded them I called 30 minutes ago and was told the same thing.

## 2022-04-15 NOTE — Anesthesia Preprocedure Evaluation (Signed)
Anesthesia Evaluation  Patient identified by MRN, date of birth, ID band Patient awake    Reviewed: Allergy & Precautions, H&P , NPO status , Patient's Chart, lab work & pertinent test results  Airway Mallampati: II  TM Distance: >3 FB Neck ROM: Full    Dental  (+) Dental Advisory Given, Missing   Pulmonary sleep apnea , pneumonia, resolved   Pulmonary exam normal breath sounds clear to auscultation       Cardiovascular Exercise Tolerance: Good hypertension, Pt. on medications + angina  + CAD and + Past MI  Normal cardiovascular exam+ dysrhythmias Atrial Fibrillation + pacemaker  Rhythm:Regular Rate:Normal    Dist LAD lesion is 75% stenosed.   Prox LAD to Mid LAD lesion is 30% stenosed.   Widely patent coronary arteries with a single lesion noted at a flexion point in the distal LAD (small area of myocardium supplied beyond the lesion)   Normal LVEDP   Recommend medical therapy for isolated distal lesion in single vessel distribution   Pt noted to be in atrial flutter today. Will review with Dr Debara Pickett for consideration of oral anticoagulation.      Neuro/Psych  PSYCHIATRIC DISORDERS  Depression Bipolar Disorder  Dementia negative neurological ROS     GI/Hepatic negative GI ROS, Neg liver ROS,,,  Endo/Other  negative endocrine ROS    Renal/GU Renal disease  negative genitourinary   Musculoskeletal negative musculoskeletal ROS (+)    Abdominal   Peds negative pediatric ROS (+)  Hematology  (+) Blood dyscrasia, anemia   Anesthesia Other Findings Sore throat  Reproductive/Obstetrics negative OB ROS                              Anesthesia Physical Anesthesia Plan  ASA: 3  Anesthesia Plan: General   Post-op Pain Management: Dilaudid IV   Induction: Intravenous  PONV Risk Score and Plan: 4 or greater and Ondansetron and Dexamethasone  Airway Management Planned: Oral  ETT  Additional Equipment:   Intra-op Plan:   Post-operative Plan: Extubation in OR  Informed Consent: I have reviewed the patients History and Physical, chart, labs and discussed the procedure including the risks, benefits and alternatives for the proposed anesthesia with the patient or authorized representative who has indicated his/her understanding and acceptance.     Dental advisory given  Plan Discussed with: CRNA and Surgeon  Anesthesia Plan Comments:          Anesthesia Quick Evaluation

## 2022-04-15 NOTE — Progress Notes (Signed)
Called [facility] because new bed assignment was approved for 333.  Spoke to [employer] and gave report.

## 2022-04-15 NOTE — Interval H&P Note (Signed)
History and Physical Interval Note:  04/15/2022 10:44 AM  Mario Massey  has presented today for surgery, with the diagnosis of Benign Prostatic Hyperplasia with retention.  The various methods of treatment have been discussed with the patient and family. After consideration of risks, benefits and other options for treatment, the patient has consented to  Procedure(s): CYSTOSCOPY (N/A) Dasher (TURP) (N/A) as a surgical intervention.  The patient's history has been reviewed, patient examined, no change in status, stable for surgery.  I have reviewed the patient's chart and labs.  Questions were answered to the patient's satisfaction.     Nicolette Bang

## 2022-04-15 NOTE — Anesthesia Procedure Notes (Signed)
Procedure Name: Intubation Date/Time: 04/15/2022 11:49 AM  Performed by: Myna Bright, CRNAPre-anesthesia Checklist: Patient identified, Emergency Drugs available, Suction available and Patient being monitored Patient Re-evaluated:Patient Re-evaluated prior to induction Oxygen Delivery Method: Circle System Utilized Preoxygenation: Pre-oxygenation with 100% oxygen Induction Type: IV induction Ventilation: Mask ventilation without difficulty Laryngoscope Size: Mac and 4 Grade View: Grade I Tube type: Oral Tube size: 7.5 mm Number of attempts: 1 Airway Equipment and Method: Stylet and Oral airway Placement Confirmation: ETT inserted through vocal cords under direct vision, positive ETCO2 and breath sounds checked- equal and bilateral Secured at: 22 cm Tube secured with: Tape Dental Injury: Teeth and Oropharynx as per pre-operative assessment

## 2022-04-15 NOTE — Progress Notes (Signed)
Bed placement beeper went off with bed assignment of 301.  Showing bed ready 301 in computer.  Called to give report and was informed the charge nurse needs to approve the bed.

## 2022-04-15 NOTE — Anesthesia Postprocedure Evaluation (Signed)
Anesthesia Post Note  Patient: Mario Massey  Procedure(s) Performed: CYSTOSCOPY (Prostate) TRANSURETHRAL RESECTION OF THE PROSTATE (TURP) (Prostate)  Patient location during evaluation: PACU Anesthesia Type: General Level of consciousness: awake and alert and oriented Pain management: pain level controlled Vital Signs Assessment: post-procedure vital signs reviewed and stable Respiratory status: spontaneous breathing, nonlabored ventilation and respiratory function stable Cardiovascular status: blood pressure returned to baseline and stable Postop Assessment: no apparent nausea or vomiting Anesthetic complications: no  No notable events documented.   Last Vitals:  Vitals:   04/15/22 1315 04/15/22 1326  BP: 137/87   Pulse: 70 69  Resp: 18 17  Temp:    SpO2: 100% 100%    Last Pain:  Vitals:   04/15/22 1243  TempSrc:   PainSc: 0-No pain                 Wilver Tignor C Ligaya Cormier

## 2022-04-15 NOTE — Transfer of Care (Signed)
Immediate Anesthesia Transfer of Care Note  Patient: Mario Massey  Procedure(s) Performed: CYSTOSCOPY (Prostate) TRANSURETHRAL RESECTION OF THE PROSTATE (TURP) (Prostate)  Patient Location: PACU  Anesthesia Type:General  Level of Consciousness: awake, alert , and patient cooperative  Airway & Oxygen Therapy: Patient Spontanous Breathing and Patient connected to face mask oxygen  Post-op Assessment: Report given to RN, Post -op Vital signs reviewed and stable, and Patient moving all extremities  Post vital signs: Reviewed and stable  Last Vitals:  Vitals Value Taken Time  BP 156/83 04/15/22 1245  Temp    Pulse 69 04/15/22 1247  Resp 17 04/15/22 1247  SpO2 100 % 04/15/22 1247  Vitals shown include unvalidated device data.  Last Pain:  Vitals:   04/15/22 1008  TempSrc: Oral      Patients Stated Pain Goal: 4 ([id number])  Complications: No notable events documented.

## 2022-04-15 NOTE — Progress Notes (Signed)
Pt has had no c/o of pain or discomfort. Pt also reports he feels the urge to urinate. Will continue to monitor.

## 2022-04-16 DIAGNOSIS — N401 Enlarged prostate with lower urinary tract symptoms: Secondary | ICD-10-CM | POA: Diagnosis not present

## 2022-04-16 LAB — CBC
HCT: 37.2 % — ABNORMAL LOW (ref 39.0–52.0)
Hemoglobin: 12.2 g/dL — ABNORMAL LOW (ref 13.0–17.0)
MCH: 30.7 pg (ref 26.0–34.0)
MCHC: 32.8 g/dL (ref 30.0–36.0)
MCV: 93.5 fL (ref 80.0–100.0)
Platelets: 148 10*3/uL — ABNORMAL LOW (ref 150–400)
RBC: 3.98 MIL/uL — ABNORMAL LOW (ref 4.22–5.81)
RDW: 13.2 % (ref 11.5–15.5)
WBC: 9.2 10*3/uL (ref 4.0–10.5)
nRBC: 0 % (ref 0.0–0.2)

## 2022-04-16 LAB — BASIC METABOLIC PANEL
Anion gap: 8 (ref 5–15)
BUN: 23 mg/dL (ref 8–23)
CO2: 28 mmol/L (ref 22–32)
Calcium: 8.5 mg/dL — ABNORMAL LOW (ref 8.9–10.3)
Chloride: 101 mmol/L (ref 98–111)
Creatinine, Ser: 0.8 mg/dL (ref 0.61–1.24)
GFR, Estimated: >60 mL/min (ref >60)
Glucose, Bld: 105 mg/dL — ABNORMAL HIGH (ref 70–99)
Potassium: 4.3 mmol/L (ref 3.5–5.1)
Sodium: 137 mmol/L (ref 135–145)

## 2022-04-16 LAB — SURGICAL PATHOLOGY

## 2022-04-16 NOTE — TOC Progression Note (Signed)
Transition of Care [facility]) - Progression Note    Patient Details  Name: Mario Massey MRN: [id number] Date of Birth: [birth year]  Transition of Care [facility]) CM/SW [place], [place] Phone Number: 04/16/2022, 4:04 PM  Clinical Narrative:    CSW updated that pt is discharging and in need of a bedside commode. CSW provided referral to [facility] with [employer]. CSW requested that MD place DME order and cosign narrative for needed DME. TOC signing off.    Barriers to Discharge: Continued Medical Work up  Expected Discharge Plan and Services         Expected Discharge Date: 04/16/22                                     Social Determinants of Health (SDOH) Interventions SDOH Screenings   Food Insecurity: No Food Insecurity (04/15/2022)  Housing: Low Risk  (04/15/2022)  Transportation Needs: No Transportation Needs (04/15/2022)  Utilities: Not At Risk (04/15/2022)  Tobacco Use: Low Risk  (04/15/2022)    Readmission Risk Interventions     No data to display

## 2022-04-16 NOTE — Progress Notes (Signed)
Mobility Specialist Progress Note:   04/16/22 1141  Mobility  Activity Ambulated with assistance in hallway  Level of Assistance Moderate assist, patient does 50-74%  Assistive Device Front wheel walker  Distance Ambulated (ft) 160 ft  Activity Response Tolerated well  Mobility Referral Yes  $Mobility charge 1 Mobility   Pt agreeable to mobility session. Required ModA to stand, CGA for stable and safe ambulation in hallway. Returned pt to room, all needs met, daughter in room.   Royetta Crochet Mobility Specialist Please contact via Solicitor or  Rehab office at [phone]

## 2022-04-16 NOTE — Progress Notes (Signed)
  Transition of Care [facility]) Screening Note   Patient Details  Name: Mario Massey Date of Birth: May 03, 1945   Transition of Care [facility]) CM/SW Contact:    Iona Beard, [place] Phone Number: 04/16/2022, 9:27 AM  [facility] notified of pts hospital admission. [place] notification ID is [phone].   Transition of Care Department [facility]) has reviewed patient and no TOC needs have been identified at this time. We will continue to monitor patient advancement through interdisciplinary progression rounds. If new patient transition needs arise, please place a TOC consult.

## 2022-04-16 NOTE — Progress Notes (Signed)
Due to patients recent Benign Prostatic Hyperplasia with retention the beneficiary will be confined to a single room

## 2022-04-16 NOTE — Progress Notes (Signed)
Pt did not sleep during the night. He complained of pain multiple times, PRN Oxycodone and Levsin given. 3350 mL of yellow/straw colored urine emptied from foley bag during the night. New PIV placed in RFA. Pt A&O x 2-3 and has asked about his daughter most of the night.

## 2022-04-16 NOTE — Progress Notes (Signed)
IV removed and discharge instructions reviewed.  No new medications and follow up with Dr Alyson Ingles scheduled.  Assisted daughter to put on briefs and clothing for discharge.  Home equipment DME to be delivered to home.  Transported by WC to entrance and daughter to drive home

## 2022-04-19 NOTE — Discharge Summary (Signed)
Physician Discharge Summary  Patient ID: Mario Massey MRN: [id number] DOB/AGE: 1945/10/16 77 y.o.  Admit date: 04/15/2022 Discharge date: 04/16/2022  Admission Diagnoses:  BPH with urinary obstruction  Discharge Diagnoses:  Principal Problem:   BPH with urinary obstruction   Past Medical History:  Diagnosis Date   Ascending aorta dilation (HCC)    Atrial flutter (HCC)    AV dissociation    Bipolar disorder ([place])    Coronary artery disease    Dementia [facility])    daughter states he asks alot of questions when he is nervous.   Depression    Essential hypertension    Foley catheter in place    Hyperlipidemia LDL goal <70    Hypertension    Myocardial infarction ([place])    Presence of permanent cardiac pacemaker    RBBB    Second degree AV block, Mobitz type I     Surgeries: Procedure(s): CYSTOSCOPY TRANSURETHRAL RESECTION OF THE PROSTATE (TURP) on 04/15/2022   Consultants (if any):   Discharged Condition: Improved  Hospital Course: Mario Massey is an 77 y.o. male who was admitted 04/15/2022 with a diagnosis of BPH with urinary obstruction and went to the operating room on 04/15/2022 and underwent the above named procedures.    He was given perioperative antibiotics:  Anti-infectives (From admission, onward)    Start     Dose/Rate Route Frequency Ordered Stop   04/15/22 1015  cefTRIAXone (ROCEPHIN) 2 g in sodium chloride 0.9 % 100 mL IVPB        2 g 200 mL/hr over 30 Minutes Intravenous 30 min pre-op 04/15/22 0950 04/15/22 1300   04/15/22 0944  sodium chloride 0.9 % with cefTRIAXone (ROCEPHIN) ADS Med       Note to Pharmacy: Abbie Sons S: cabinet override      04/15/22 0944 04/15/22 1158     .  He was given sequential compression devices, early ambulation for DVT prophylaxis.  He benefited maximally from the hospital stay and there were no complications.    Recent vital signs:  Vitals:   04/16/22 0430 04/16/22 1305  BP: (!) 109/48 (!) 147/66  Pulse: 70  70  Resp: 18 20  Temp: 98.7 F (37.1 C) (!) 100.7 F (38.2 C)  SpO2: 93% 98%    Recent laboratory studies:  Lab Results  Component Value Date   HGB 12.2 (L) 04/16/2022   HGB 13.5 04/15/2022   HGB 13.3 04/02/2022   Lab Results  Component Value Date   WBC 9.2 04/16/2022   PLT 148 (L) 04/16/2022   Lab Results  Component Value Date   INR 1.1 04/18/2021   Lab Results  Component Value Date   NA 137 04/16/2022   K 4.3 04/16/2022   CL 101 04/16/2022   CO2 28 04/16/2022   BUN 23 04/16/2022   CREATININE 0.80 04/16/2022   GLUCOSE 105 (H) 04/16/2022    Discharge Medications:   Allergies as of 04/16/2022   No Known Allergies      Medication List     TAKE these medications    atorvastatin 40 MG tablet Commonly known as: LIPITOR Take 1 tablet (40 mg total) by mouth daily.   bacitracin ointment Apply topically 2 (two) times daily.   divalproex 250 MG 24 hr tablet Commonly known as: DEPAKOTE ER Take 250 mg by mouth daily.   lisinopril 5 MG tablet Commonly known as: ZESTRIL Take 1 tablet (5 mg total) by mouth daily.   multivitamin with minerals Tabs tablet Take 1  tablet by mouth daily.   nitroGLYCERIN 0.4 MG SL tablet Commonly known as: NITROSTAT Place 1 tablet (0.4 mg total) under the tongue every 5 (five) minutes as needed for chest pain.   venlafaxine XR 150 MG 24 hr capsule Commonly known as: EFFEXOR-XR Take 1 capsule (150 mg total) by mouth daily.   warfarin 5 MG tablet Commonly known as: COUMADIN Take 1 tablet (5 mg total) by mouth daily.   ziprasidone 20 MG capsule Commonly known as: GEODON Take 1 capsule (20 mg total) by mouth daily.        Diagnostic Studies: CUP PACEART REMOTE DEVICE CHECK  Result Date: 04/03/2022 Scheduled remote reviewed. Normal device function.  Known persistent AF, controlled rates, Warfarin Next remote 91 days. LA  CT Renal Stone Study  Result Date: 04/02/2022 CLINICAL DATA:  Abdominal/flank pain, stone suspected  worsening lower abd / penis pain, indwelling cath EXAM: CT ABDOMEN AND PELVIS WITHOUT CONTRAST TECHNIQUE: Multidetector CT imaging of the abdomen and pelvis was performed following the standard protocol without IV contrast. RADIATION DOSE REDUCTION: This exam was performed according to the departmental dose-optimization program which includes automated exposure control, adjustment of the mA and/or kV according to patient size and/or use of iterative reconstruction technique. COMPARISON:  None Available. FINDINGS: Lower chest: No acute abnormality. Hepatobiliary: Cholelithiasis without evidence of acute inflammatory change. Unremarkable appearance of the liver. No evidence of biliary dilation. Pancreas: Unremarkable. No pancreatic ductal dilatation or surrounding inflammatory changes. Spleen: Normal in size without focal abnormality. Adrenals/Urinary Tract: Adrenal glands are unremarkable. Kidneys are normal, without renal calculi, focal lesion, or hydronephrosis. Bladder is decompressed around a Foley catheter. Small contained fluid collection versus diverticulum arising from the bladder is unchanged. Stomach/Bowel: Unremarkable appearance of the stomach. No dilated bowel loops to suggest obstruction. Diverticulosis without evidence diverticulitis. No findings to suggest appendicitis. Vascular/Lymphatic: Aortic atherosclerosis. No enlarged abdominal or pelvic lymph nodes. Reproductive: Prostate is unremarkable. Other: Unchanged umbilical hernia which contains fat. No abdominopelvic ascites. Musculoskeletal: Multilevel degenerative change. IMPRESSION: 1. No evidence of acute abnormality. 2. Cholelithiasis. 3. Diverticulosis. 4. Small contained fluid collection versus diverticulum arising from the bladder is unchanged. Electronically Signed   By: Margaretha Sheffield M.D.   On: 04/02/2022 13:32   CUP PACEART INCLINIC DEVICE CHECK  Result Date: 03/26/2022 Pacemaker check in clinic. Normal device function. Thresholds,  sensing, impedances consistent with previous measurements. Device programmed to maximize longevity. AMS EPISODES all burst of AFIB/FLUTTER, longest currently in AF ongoing since 03/07/22. on [place], ventricular rates controlled, Dr. Lovena Le aware. Device programmed at appropriate safety margins. Histogram distribution appropriate for patient activity level. Device programmed to optimize intrinsic conduction. Estimated longevity 8.3 years. Patient enrolled in remote follow-up. Patient education completed.Leticia Penna, RN Pacemaker check in clinic. Normal device function. Thresholds, sensing, impedances consistent with previous measurements. Device programmed to maximize longevity. AMS EPISODES all burst of AFIB/FLUTTER, longest currently in AF ongoing since 03/07/22. on [place], ventricular rates controlled, Dr. Lovena Le aware. Device programmed at appropriate safety margins. Histogram distribution appropriate for patient activity level. Device programmed to optimize intrinsic conduction. Estimated longevity 8.3 years. Patient enrolled in remote follow-up. Patient education completed.Leticia Penna, RN   Disposition: Discharge disposition: 01-Home or Self Care       Discharge Instructions     Discharge patient   Complete by: As directed    Discharge disposition: 01-Home or Self Care   Discharge patient date: 04/16/2022        Follow-up Information     Darious Rehman, Candee Furbish, MD.  Call in 1 week(s).   Specialty: Urology Contact information: [address]  [place] [address] [phone]                  Signed: Nicolette Bang 04/19/2022, 1:44 PM

## 2022-04-20 ENCOUNTER — Emergency Department (HOSPITAL_COMMUNITY)
Admission: EM | Admit: 2022-04-20 | Discharge: 2022-04-20 | Disposition: A | Discharge status: Home / Self Care | Payer: No Typology Code available for payment source | Attending: Emergency Medicine | Admitting: Emergency Medicine

## 2022-04-20 ENCOUNTER — Encounter (HOSPITAL_COMMUNITY): Payer: Self-pay

## 2022-04-20 ENCOUNTER — Other Ambulatory Visit: Payer: Self-pay

## 2022-04-20 DIAGNOSIS — R310 Gross hematuria: Secondary | ICD-10-CM | POA: Diagnosis not present

## 2022-04-20 DIAGNOSIS — N99820 Postprocedural hemorrhage and hematoma of a genitourinary system organ or structure following a genitourinary system procedure: Secondary | ICD-10-CM | POA: Insufficient documentation

## 2022-04-20 DIAGNOSIS — R319 Hematuria, unspecified: Secondary | ICD-10-CM | POA: Diagnosis present

## 2022-04-20 DIAGNOSIS — Z7901 Long term (current) use of anticoagulants: Secondary | ICD-10-CM | POA: Insufficient documentation

## 2022-04-20 LAB — BASIC METABOLIC PANEL
Anion gap: 7 (ref 5–15)
BUN: 23 mg/dL (ref 8–23)
CO2: 27 mmol/L (ref 22–32)
Calcium: 8.7 mg/dL — ABNORMAL LOW (ref 8.9–10.3)
Chloride: 103 mmol/L (ref 98–111)
Creatinine, Ser: 0.79 mg/dL (ref 0.61–1.24)
GFR, Estimated: >60 mL/min (ref >60)
Glucose, Bld: 111 mg/dL — ABNORMAL HIGH (ref 70–99)
Potassium: 4.6 mmol/L (ref 3.5–5.1)
Sodium: 137 mmol/L (ref 135–145)

## 2022-04-20 LAB — CBC WITH DIFFERENTIAL/PLATELET
Abs Immature Granulocytes: 0.02 10*3/uL (ref 0.00–0.07)
Basophils Absolute: 0 10*3/uL (ref 0.0–0.1)
Basophils Relative: 1 %
Eosinophils Absolute: 0.2 10*3/uL (ref 0.0–0.5)
Eosinophils Relative: 3 %
HCT: 39.6 % (ref 39.0–52.0)
Hemoglobin: 13 g/dL (ref 13.0–17.0)
Immature Granulocytes: 0 %
Lymphocytes Relative: 27 %
Lymphs Abs: 2.1 10*3/uL (ref 0.7–4.0)
MCH: 31 pg (ref 26.0–34.0)
MCHC: 32.8 g/dL (ref 30.0–36.0)
MCV: 94.5 fL (ref 80.0–100.0)
Monocytes Absolute: 0.6 10*3/uL (ref 0.1–1.0)
Monocytes Relative: 8 %
Neutro Abs: 4.7 10*3/uL (ref 1.7–7.7)
Neutrophils Relative %: 61 %
Platelets: 161 10*3/uL (ref 150–400)
RBC: 4.19 MIL/uL — ABNORMAL LOW (ref 4.22–5.81)
RDW: 13.2 % (ref 11.5–15.5)
WBC: 7.7 10*3/uL (ref 4.0–10.5)
nRBC: 0 % (ref 0.0–0.2)

## 2022-04-20 LAB — URINALYSIS, ROUTINE W REFLEX MICROSCOPIC
Bilirubin Urine: NEGATIVE
Glucose, UA: NEGATIVE mg/dL
Ketones, ur: NEGATIVE mg/dL
Nitrite: NEGATIVE
Protein, ur: 100 mg/dL — AB
RBC / HPF: >50 RBC/hpf (ref 0–5)
Specific Gravity, Urine: 1.017 (ref 1.005–1.030)
WBC, UA: >50 WBC/hpf (ref 0–5)
pH: 6 (ref 5.0–8.0)

## 2022-04-20 MED ORDER — TAMSULOSIN HCL 0.4 MG PO CAPS: 0.4000 mg | ORAL_CAPSULE | Freq: Every day | ORAL | 0 refills | Status: DC

## 2022-04-20 MED ORDER — TAMSULOSIN HCL 0.4 MG PO CAPS
0.4000 mg | ORAL_CAPSULE | Freq: Once | ORAL | Status: AC
  Administered 2022-04-20: 0.4 mg via ORAL
  Filled 2022-04-20: qty 1

## 2022-04-20 NOTE — ED Notes (Signed)
Patient urinated 66ml'S

## 2022-04-20 NOTE — ED Triage Notes (Signed)
Pt BIB daughter due to pt having bladder surgery on Monday. Had a catheter in place for 4 years and had it removed. Has since had urine incontinence, bright red blood noted in urine by daughter, states it is a continuous drip.   Pt denies pain. States he is able to urinate but does not feel like bladder is emptying completely.   Pt does have dementia. Daughter states pt was weak yesterday and had a near syncope episode.

## 2022-04-20 NOTE — Discharge Instructions (Addendum)
The blood you are seen in your urine is probably due to your recent surgery and should clear without difficulty.  You are being placed on a medicine called Flomax which may help you empty your bladder more efficiently as this helps your bladder muscle squeeze.  You have been given your first dose here, take your next dose tomorrow evening.  Plan to see Dr. Alyson Ingles on Monday as planned.

## 2022-04-20 NOTE — ED Notes (Signed)
Post Void Residual  18 ml

## 2022-04-20 NOTE — ED Provider Notes (Signed)
[facility] Provider Note   CSN: [id number] Arrival date & time: 04/20/22  1646     History  Chief Complaint  Patient presents with   Hematuria    Mario Massey is a 77 y.o. male with a history most significant for BPH who underwent a TURP 5 days ago under the care of Dr. Alyson Ingles, prior to this had use of Foley catheter for approximately 4 years, was discharged home without any complications although states he had persistent drips of urine from his penis after going home.  He states initially there was some blood-tinged to the urine, but today he states the urine has become bright red.  He denies fevers or chills.  He has been able to occasionally urinate a small stream of urine, but at baseline he has persistent drips of urine only requiring the need of a depends.  Daughter at the bedside states they discussed him going home with the Foley catheter from this hospitalization but it was decided against.  He does have a history of Coumadin use secondary to a flutter, this was held prior to surgery and he has not yet started back his Coumadin.  He has had no fevers or chills, maintains a good appetite.  He feels like he does not completely empty his bladder.  Patient has a history of dementia, daughter states that he felt weak yesterday and had a near syncopal episode, but feels better today in this regard.  The history is provided by the patient.       Home Medications Prior to Admission medications   Medication Sig Start Date End Date Taking? Authorizing Provider  tamsulosin (FLOMAX) 0.4 MG CAPS capsule Take 1 capsule (0.4 mg total) by mouth daily after supper. 04/20/22  Yes Jolean Madariaga, Almyra Free, PA-C  atorvastatin (LIPITOR) 40 MG tablet Take 1 tablet (40 mg total) by mouth daily. 01/15/22   Hilty, Nadean Corwin, MD  bacitracin ointment Apply topically 2 (two) times daily. 04/23/21   Raiford Noble Latif, DO  divalproex (DEPAKOTE ER) 250 MG 24 hr tablet  Take 250 mg by mouth daily.    [provider]  lisinopril (ZESTRIL) 5 MG tablet Take 1 tablet (5 mg total) by mouth daily. 05/11/21   Gerlene Fee, NP  Multiple Vitamin (MULTIVITAMIN WITH MINERALS) TABS tablet Take 1 tablet by mouth daily.    [provider]  nitroGLYCERIN (NITROSTAT) 0.4 MG SL tablet Place 1 tablet (0.4 mg total) under the tongue every 5 (five) minutes as needed for chest pain. 10/10/21   Imogene Burn, PA-C  venlafaxine XR (EFFEXOR-XR) 150 MG 24 hr capsule Take 1 capsule (150 mg total) by mouth daily. 05/11/21   Gerlene Fee, NP  warfarin (COUMADIN) 5 MG tablet Take 1 tablet (5 mg total) by mouth daily. 03/26/22   Evans Lance, MD  ziprasidone (GEODON) 20 MG capsule Take 1 capsule (20 mg total) by mouth daily. Patient not taking: Reported on 04/09/2022 05/11/21   Gerlene Fee, NP      Allergies    Patient has no known allergies.    Review of Systems   Review of Systems  Constitutional:  Negative for chills and fever.  HENT:  Negative for congestion and sore throat.   Eyes: Negative.   Respiratory:  Negative for chest tightness and shortness of breath.   Cardiovascular:  Negative for chest pain.  Gastrointestinal:  Negative for abdominal pain and nausea.  Genitourinary:  Positive for enuresis  and hematuria. Negative for dysuria and flank pain.  Musculoskeletal:  Negative for arthralgias, joint swelling and neck pain.  Skin: Negative.  Negative for rash and wound.  Neurological:  Negative for dizziness, weakness, light-headedness, numbness and headaches.  Psychiatric/Behavioral: Negative.      Physical Exam Updated Vital Signs BP (!) 155/80 (BP Location: Right Arm)   Pulse 72   Temp 98.1 F (36.7 C) (Oral)   Resp 16   SpO2 94%  Physical Exam Vitals and nursing note reviewed.  Constitutional:      Appearance: He is well-developed.  HENT:     Head: Normocephalic and atraumatic.  Eyes:     Conjunctiva/sclera: Conjunctivae normal.   Cardiovascular:     Rate and Rhythm: Normal rate and regular rhythm.     Heart sounds: Normal heart sounds.  Pulmonary:     Effort: Pulmonary effort is normal.     Breath sounds: Normal breath sounds. No wheezing.  Abdominal:     General: Bowel sounds are normal.     Palpations: Abdomen is soft.     Tenderness: There is no abdominal tenderness.  Musculoskeletal:        General: Normal range of motion.     Cervical back: Normal range of motion.  Skin:    General: Skin is warm and dry.  Neurological:     Mental Status: He is alert.     ED Results / Procedures / Treatments   Labs (all labs ordered are listed, but only abnormal results are displayed) Labs Reviewed  URINALYSIS, ROUTINE W REFLEX MICROSCOPIC - Abnormal; Notable for the following components:      Result Value   Color, Urine AMBER (*)    APPearance CLOUDY (*)    Hgb urine dipstick LARGE (*)    Protein, ur 100 (*)    Leukocytes,Ua MODERATE (*)    Bacteria, UA FEW (*)    All other components within normal limits  CBC WITH DIFFERENTIAL/PLATELET - Abnormal; Notable for the following components:   RBC 4.19 (*)    All other components within normal limits  BASIC METABOLIC PANEL - Abnormal; Notable for the following components:   Glucose, Bld 111 (*)    Calcium 8.7 (*)    All other components within normal limits    EKG EKG Interpretation  Date/Time:  Saturday April 20 2022 17:51:24 EDT Ventricular Rate:  70 PR Interval:  173 QRS Duration: 121 QT Interval:  426 QTC Calculation: 460 R Axis:   -45 Text Interpretation: Sinus rhythm IVCD, consider atypical RBBB Left ventricular hypertrophy Inferior infarct, old Anterior infarct, old Baseline wander in lead(s) V4 Suspect Atrial sensed ventricular paced rhythm Confirmed by Fredia Sorrow [phone]) on 04/20/2022 6:01:49 PM  Radiology No results found.  Procedures Procedures    Medications Ordered in ED Medications  tamsulosin (FLOMAX) capsule 0.4 mg (0.4 mg  Oral Given 04/20/22 1915)    ED Course/ Medical Decision Making/ A&P                             Medical Decision Making Patient presenting with gross hematuria day 6 after TURP surgery.  Also with complaints of frequent dribbling of urine from his penis, prior to his procedure he had used a Foley catheter for the past 4 years.  He does not have a Foley currently in place.  Differential diagnosis including urinary retention, UTI, complication from his TURP procedure.  Amount and/or Complexity of Data Reviewed  Labs: ordered.    Details: Labs reviewed, he has a normal kidney function with a creatinine of 0.79, CBC is normal with a hemoglobin of 13.0, WBC count of 7.7.  His urine results reflects an amber-colored urine, large amount of hemoglobin, moderate leukocytes with few bacteria.    He was able to give [place] 40 mL of urine once asked for a urine sample.  After this a bladder scan was completed, he was not retaining significant amounts of urine, his postvoid residual was 18 mL. Discussion of management or test interpretation with external provider(s): Patient was discussed with Dr. Abner Greenspan of urology, he advised that patient can have a Foley placed if he chooses but since he is not retaining urine medically he does not need this replaced.  Patient is happy with this plan.  It was recommended that he start Flomax which may better help him empty his bladder, first dose was given here, prescription provided.  He has follow-up with Dr. Alyson Ingles in 2 days, he was encouraged to keep this appointment.  Risk Prescription drug management.           Final Clinical Impression(s) / ED Diagnoses Final diagnoses:  Gross hematuria  Postoperative hemorrhage involving genitourinary system following genitourinary procedure    Rx / DC Orders ED Discharge Orders          Ordered    tamsulosin (FLOMAX) 0.4 MG CAPS capsule  Daily after supper        04/20/22 1915              Evalee Jefferson,  Hershal Coria 04/20/22 1933    Fredia Sorrow, MD 04/21/22 [phone]

## 2022-04-22 ENCOUNTER — Ambulatory Visit (INDEPENDENT_AMBULATORY_CARE_PROVIDER_SITE_OTHER): Payer: No Typology Code available for payment source | Admitting: Urology

## 2022-04-22 VITALS — BP 116/75 | HR 70 | Ht 72.0 in | Wt 251.2 lb

## 2022-04-22 DIAGNOSIS — Z978 Presence of other specified devices: Secondary | ICD-10-CM

## 2022-04-22 DIAGNOSIS — R3915 Urgency of urination: Secondary | ICD-10-CM

## 2022-04-22 DIAGNOSIS — R339 Retention of urine, unspecified: Secondary | ICD-10-CM | POA: Diagnosis not present

## 2022-04-22 DIAGNOSIS — R350 Frequency of micturition: Secondary | ICD-10-CM | POA: Diagnosis not present

## 2022-04-22 DIAGNOSIS — Z87440 Personal history of urinary (tract) infections: Secondary | ICD-10-CM

## 2022-04-22 DIAGNOSIS — R300 Dysuria: Secondary | ICD-10-CM

## 2022-04-22 LAB — BLADDER SCAN AMB NON-IMAGING: Scan Result: 79

## 2022-04-22 MED ORDER — AMBULATORY NON FORMULARY MEDICATION: 12 refills | Status: DC

## 2022-04-22 MED ORDER — NITROFURANTOIN MONOHYD MACRO 100 MG PO CAPS: 100.0000 mg | ORAL_CAPSULE | Freq: Two times a day (BID) | ORAL | 0 refills | Status: DC

## 2022-04-22 NOTE — Progress Notes (Unsigned)
04/22/2022 9:38 AM   Mario Massey December 03, 1945 [id number]  Referring provider: Carrolyn Meiers, MD [place],  [place] [address]  No chief complaint on file.   HPI:    PMH: Past Medical History:  Diagnosis Date   Ascending aorta dilation (HCC)    Atrial flutter (HCC)    AV dissociation    Bipolar disorder ([place])    Coronary artery disease    Dementia [facility])    daughter states he asks alot of questions when he is nervous.   Depression    Essential hypertension    Foley catheter in place    Hyperlipidemia LDL goal <70    Hypertension    Myocardial infarction ([place])    Presence of permanent cardiac pacemaker    RBBB    Second degree AV block, Mobitz type I     Surgical History: Past Surgical History:  Procedure Laterality Date   KNEE SURGERY     with replacement on right knee   LEFT HEART CATH AND CORONARY ANGIOGRAPHY N/A 07/18/2021   Procedure: LEFT HEART CATH AND CORONARY ANGIOGRAPHY;  Surgeon: Sherren Mocha, MD;  Location: [facility];  Service: Cardiovascular;  Laterality: N/A;   PACEMAKER IMPLANT N/A 12/31/2021   Procedure: PACEMAKER IMPLANT;  Surgeon: Evans Lance, MD;  Location: [facility];  Service: Cardiovascular;  Laterality: N/A;    Home Medications:  Allergies as of 04/22/2022   No Known Allergies      Medication List        Accurate as of April 22, 2022  9:38 AM. If you have any questions, ask your nurse or doctor.          atorvastatin 40 MG tablet Commonly known as: LIPITOR Take 1 tablet (40 mg total) by mouth daily.   bacitracin ointment Apply topically 2 (two) times daily.   divalproex 250 MG 24 hr tablet Commonly known as: DEPAKOTE ER Take 250 mg by mouth daily.   lisinopril 5 MG tablet Commonly known as: ZESTRIL Take 1 tablet (5 mg total) by mouth daily.   multivitamin with minerals Tabs tablet Take 1 tablet by mouth daily.   nitroGLYCERIN 0.4 MG SL tablet Commonly known as:  NITROSTAT Place 1 tablet (0.4 mg total) under the tongue every 5 (five) minutes as needed for chest pain.   tamsulosin 0.4 MG Caps capsule Commonly known as: FLOMAX Take 1 capsule (0.4 mg total) by mouth daily after supper.   venlafaxine XR 150 MG 24 hr capsule Commonly known as: EFFEXOR-XR Take 1 capsule (150 mg total) by mouth daily.   warfarin 5 MG tablet Commonly known as: COUMADIN Take 1 tablet (5 mg total) by mouth daily.   ziprasidone 20 MG capsule Commonly known as: GEODON Take 1 capsule (20 mg total) by mouth daily.        Allergies: No Known Allergies  Family History: Family History  Problem Relation Age of Onset   Alzheimer's disease Neg Hx    Parkinson's disease Neg Hx     Social History:  reports that he has never smoked. He has never been exposed to tobacco smoke. He has never used smokeless tobacco. He reports current alcohol use. He reports that he does not use drugs.  ROS: All other review of systems were reviewed and are negative except what is noted above in HPI  Physical Exam: BP 116/75   Pulse 70   Ht 6' (1.829 m)   Wt 251 lb 3.2 oz (  113.9 kg)   BMI 34.07 kg/m   Constitutional:  Alert and oriented, No acute distress. HEENT: [place] AT, moist mucus membranes.  Trachea midline, no masses. Cardiovascular: No clubbing, cyanosis, or edema. Respiratory: Normal respiratory effort, no increased work of breathing. GI: Abdomen is soft, nontender, nondistended, no abdominal masses GU: No CVA tenderness.  Lymph: No cervical or inguinal lymphadenopathy. Skin: No rashes, bruises or suspicious lesions. Neurologic: Grossly intact, no focal deficits, moving all 4 extremities. Psychiatric: Normal mood and affect.  Laboratory Data: Lab Results  Component Value Date   WBC 7.7 04/20/2022   HGB 13.0 04/20/2022   HCT 39.6 04/20/2022   MCV 94.5 04/20/2022   PLT 161 04/20/2022    Lab Results  Component Value Date   CREATININE 0.79 04/20/2022    No results  found for: "PSA"  No results found for: "TESTOSTERONE"  Lab Results  Component Value Date   HGBA1C 5.6 03/12/2022    Urinalysis    Component Value Date/Time   COLORURINE AMBER (A) 04/20/2022 1740   APPEARANCEUR CLOUDY (A) 04/20/2022 1740   APPEARANCEUR Cloudy (A) 11/13/2021 1447   LABSPEC 1.017 04/20/2022 1740   PHURINE 6.0 04/20/2022 1740   GLUCOSEU NEGATIVE 04/20/2022 1740   HGBUR LARGE (A) 04/20/2022 1740   BILIRUBINUR NEGATIVE 04/20/2022 1740   BILIRUBINUR Negative 11/13/2021 1447   KETONESUR NEGATIVE 04/20/2022 1740   PROTEINUR 100 (A) 04/20/2022 1740   UROBILINOGEN 1.0 06/07/2012 1313   NITRITE NEGATIVE 04/20/2022 1740   LEUKOCYTESUR MODERATE (A) 04/20/2022 1740    Lab Results  Component Value Date   LABMICR See below: 11/13/2021   WBCUA >30 (A) 11/13/2021   LABEPIT 0-10 11/13/2021   BACTERIA FEW (A) 04/20/2022    Pertinent Imaging: *** No results found for this or any previous visit.  No results found for this or any previous visit.  No results found for this or any previous visit.  No results found for this or any previous visit.  Results for orders placed during the hospital encounter of 04/18/21  US RENAL  Narrative CLINICAL DATA:  AK I, chronic Foley catheter.  EXAM: RENAL / URINARY TRACT ULTRASOUND COMPLETE  COMPARISON:  CT April 19, 2021  FINDINGS: Right Kidney:  Renal measurements: 12.1 x 7.7 x 6.6 cm = volume: 324 mL. Echogenicity within normal limits. No mass or hydronephrosis visualized.  Left Kidney:  Renal measurements: 12.4 x 6.9 x 6.4 cm = volume: 287 mL. Echogenicity within normal limits. No mass or hydronephrosis visualized.  Bladder:  Decompressed around a Foley catheter.  Other:  None.  IMPRESSION: NO hydronephrosis.   Electronically Signed By: Dahlia Bailiff M.D. On: 04/20/2021 16:17  No valid procedures specified. No results found for this or any previous visit.  Results for orders placed during the  hospital encounter of 04/02/22  CT Renal Stone Study  Narrative CLINICAL DATA:  Abdominal/flank pain, stone suspected worsening lower abd / penis pain, indwelling cath  EXAM: CT ABDOMEN AND PELVIS WITHOUT CONTRAST  TECHNIQUE: Multidetector CT imaging of the abdomen and pelvis was performed following the standard protocol without IV contrast.  RADIATION DOSE REDUCTION: This exam was performed according to the departmental dose-optimization program which includes automated exposure control, adjustment of the mA and/or kV according to patient size and/or use of iterative reconstruction technique.  COMPARISON:  None Available.  FINDINGS: Lower chest: No acute abnormality.  Hepatobiliary: Cholelithiasis without evidence of acute inflammatory change. Unremarkable appearance of the liver. No evidence of biliary dilation.  Pancreas: Unremarkable. No  pancreatic ductal dilatation or surrounding inflammatory changes.  Spleen: Normal in size without focal abnormality.  Adrenals/Urinary Tract: Adrenal glands are unremarkable. Kidneys are normal, without renal calculi, focal lesion, or hydronephrosis. Bladder is decompressed around a Foley catheter. Small contained fluid collection versus diverticulum arising from the bladder is unchanged.  Stomach/Bowel: Unremarkable appearance of the stomach. No dilated bowel loops to suggest obstruction. Diverticulosis without evidence diverticulitis. No findings to suggest appendicitis.  Vascular/Lymphatic: Aortic atherosclerosis. No enlarged abdominal or pelvic lymph nodes.  Reproductive: Prostate is unremarkable.  Other: Unchanged umbilical hernia which contains fat. No abdominopelvic ascites.  Musculoskeletal: Multilevel degenerative change.  IMPRESSION: 1. No evidence of acute abnormality. 2. Cholelithiasis. 3. Diverticulosis. 4. Small contained fluid collection versus diverticulum arising from the bladder is  unchanged.   Electronically Signed By: Margaretha Sheffield M.D. On: 04/02/2022 13:32   Assessment & Plan:    1. Urinary retention *** - BLADDER SCAN AMB NON-IMAGING  2. Chronic indwelling Foley catheter ***   No follow-ups on file.  Nicolette Bang, MD  [facility] [place]

## 2022-04-22 NOTE — Progress Notes (Signed)
post void residual =79mL 

## 2022-04-23 ENCOUNTER — Encounter: Payer: Self-pay | Admitting: Urology

## 2022-04-23 NOTE — Patient Instructions (Signed)
Urinary Tract Infection, Adult  A urinary tract infection (UTI) is an infection of any part of the urinary tract. The urinary tract includes the kidneys, ureters, bladder, and urethra. These organs make, store, and get rid of urine in the body. An upper UTI affects the ureters and kidneys. A lower UTI affects the bladder and urethra. What are the causes? Most urinary tract infections are caused by bacteria in your genital area around your urethra, where urine leaves your body. These bacteria grow and cause inflammation of your urinary tract. What increases the risk? You are more likely to develop this condition if: You have a urinary catheter that stays in place. You are not able to control when you urinate or have a bowel movement (incontinence). You are male and you: Use a spermicide or diaphragm for birth control. Have low estrogen levels. Are pregnant. You have certain genes that increase your risk. You are sexually active. You take antibiotic medicines. You have a condition that causes your flow of urine to slow down, such as: An enlarged prostate, if you are male. Blockage in your urethra. A kidney stone. A nerve condition that affects your bladder control (neurogenic bladder). Not getting enough to drink, or not urinating often. You have certain medical conditions, such as: Diabetes. A weak disease-fighting system (immunesystem). Sickle cell disease. Gout. Spinal cord injury. What are the signs or symptoms? Symptoms of this condition include: Needing to urinate right away (urgency). Frequent urination. This may include small amounts of urine each time you urinate. Pain or burning with urination. Blood in the urine. Urine that smells bad or unusual. Trouble urinating. Cloudy urine. Vaginal discharge, if you are male. Pain in the abdomen or the lower back. You may also have: Vomiting or a decreased appetite. Confusion. Irritability or tiredness. A fever or  chills. Diarrhea. The first symptom in older adults may be confusion. In some cases, they may not have any symptoms until the infection has worsened. How is this diagnosed? This condition is diagnosed based on your medical history and a physical exam. You may also have other tests, including: Urine tests. Blood tests. Tests for STIs (sexually transmitted infections). If you have had more than one UTI, a cystoscopy or imaging studies may be done to determine the cause of the infections. How is this treated? Treatment for this condition includes: Antibiotic medicine. Over-the-counter medicines to treat discomfort. Drinking enough water to stay hydrated. If you have frequent infections or have other conditions such as a kidney stone, you may need to see a health care provider who specializes in the urinary tract (urologist). In rare cases, urinary tract infections can cause sepsis. Sepsis is a life-threatening condition that occurs when the body responds to an infection. Sepsis is treated in the hospital with IV antibiotics, fluids, and other medicines. Follow these instructions at home:  Medicines Take over-the-counter and prescription medicines only as told by your health care provider. If you were prescribed an antibiotic medicine, take it as told by your health care provider. Do not stop using the antibiotic even if you start to feel better. General instructions Make sure you: Empty your bladder often and completely. Do not hold urine for long periods of time. Empty your bladder after sex. Wipe from front to back after urinating or having a bowel movement if you are male. Use each tissue only one time when you wipe. Drink enough fluid to keep your urine pale yellow. Keep all follow-up visits. This is important. Contact a health   care provider if: Your symptoms do not get better after 1-2 days. Your symptoms go away and then return. Get help right away if: You have severe pain in  your back or your lower abdomen. You have a fever or chills. You have nausea or vomiting. Summary A urinary tract infection (UTI) is an infection of any part of the urinary tract, which includes the kidneys, ureters, bladder, and urethra. Most urinary tract infections are caused by bacteria in your genital area. Treatment for this condition often includes antibiotic medicines. If you were prescribed an antibiotic medicine, take it as told by your health care provider. Do not stop using the antibiotic even if you start to feel better. Keep all follow-up visits. This is important. This information is not intended to replace advice given to you by your health care provider. Make sure you discuss any questions you have with your health care provider. Document Revised: 08/27/2019 Document Reviewed: 08/27/2019 Elsevier Patient Education  2023 [employer] Inc.  

## 2022-04-24 ENCOUNTER — Encounter (HOSPITAL_COMMUNITY): Payer: Self-pay | Admitting: Urology

## 2022-04-24 ENCOUNTER — Telehealth: Payer: Self-pay

## 2022-04-24 NOTE — Telephone Encounter (Signed)
Daughter called stating patient symptoms have not gotten any better since starting Macrobid. Daughter made aware that urine culture is not back yet and once we receive the results we will know if he is on the right antibiotic.  Daughter voiced understanding.

## 2022-04-25 LAB — URINE CULTURE

## 2022-04-25 MED ORDER — CIPROFLOXACIN HCL 500 MG PO TABS: 500.0000 mg | ORAL_TABLET | Freq: Two times a day (BID) | ORAL | 0 refills | Status: DC

## 2022-04-25 NOTE — Telephone Encounter (Signed)
Daughter called with no answer. Detail message left for patient to stop Macrobid and start Cipro 500mg  po bid x  7days.  Rx sent to pharmacy.

## 2022-05-07 ENCOUNTER — Ambulatory Visit (INDEPENDENT_AMBULATORY_CARE_PROVIDER_SITE_OTHER): Payer: No Typology Code available for payment source | Admitting: Urology

## 2022-05-07 DIAGNOSIS — R32 Unspecified urinary incontinence: Secondary | ICD-10-CM | POA: Diagnosis not present

## 2022-05-07 DIAGNOSIS — R339 Retention of urine, unspecified: Secondary | ICD-10-CM | POA: Diagnosis not present

## 2022-05-07 NOTE — Progress Notes (Addendum)
post void residual=2  Daughter is concerned about patients incontinence and wanted to make MD aware.  Daughter informed that MD will be made aware and someone will reach back out with his recommendations.   Patient will keep scheduled OV with MD

## 2022-05-08 NOTE — Progress Notes (Signed)
Remote pacemaker transmission.   

## 2022-06-05 IMAGING — DX DG SHOULDER 2+V*R*
2 series · 2 of 2 positions shown · non-contrast
Comparison: Right shoulder radiographs 02/20/2017

CLINICAL DATA: Right shoulder pain.

EXAM:
RIGHT SHOULDER - 2+ VIEW

[shoulder grashey]
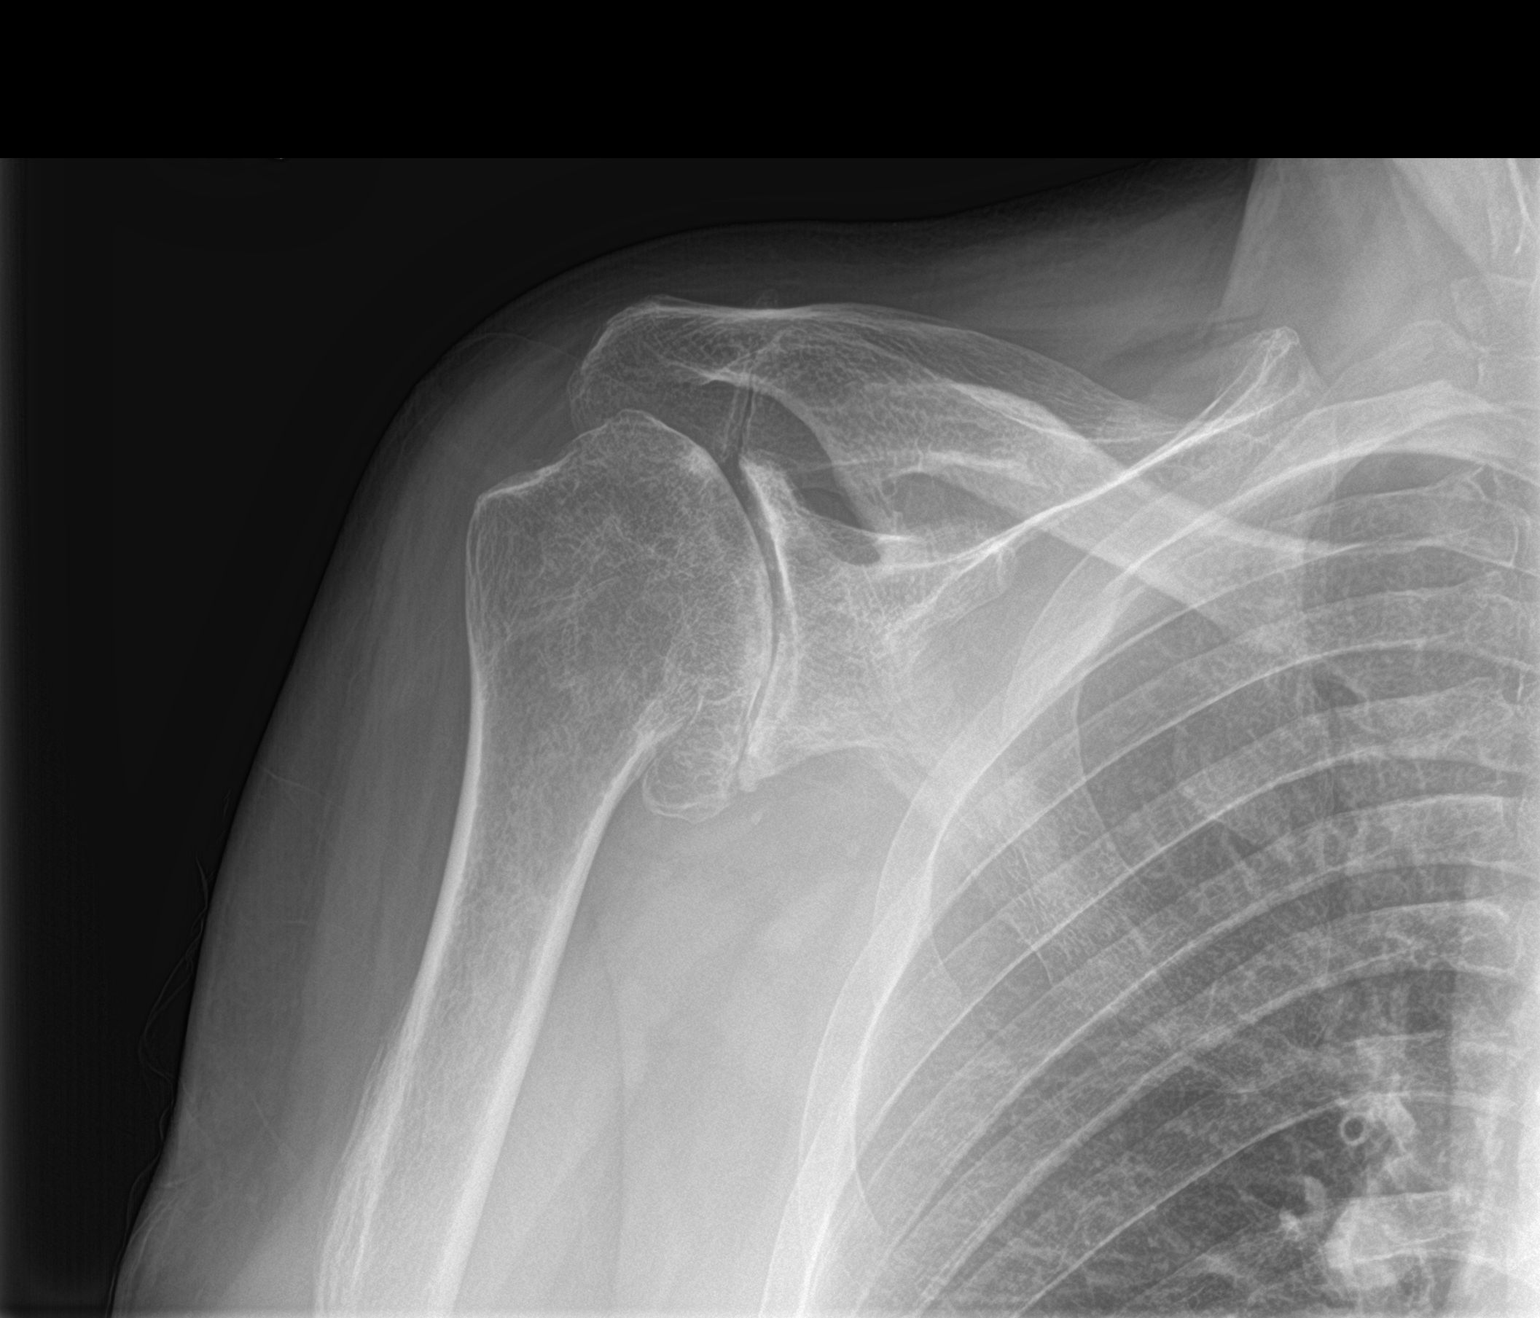

[shoulder y view]
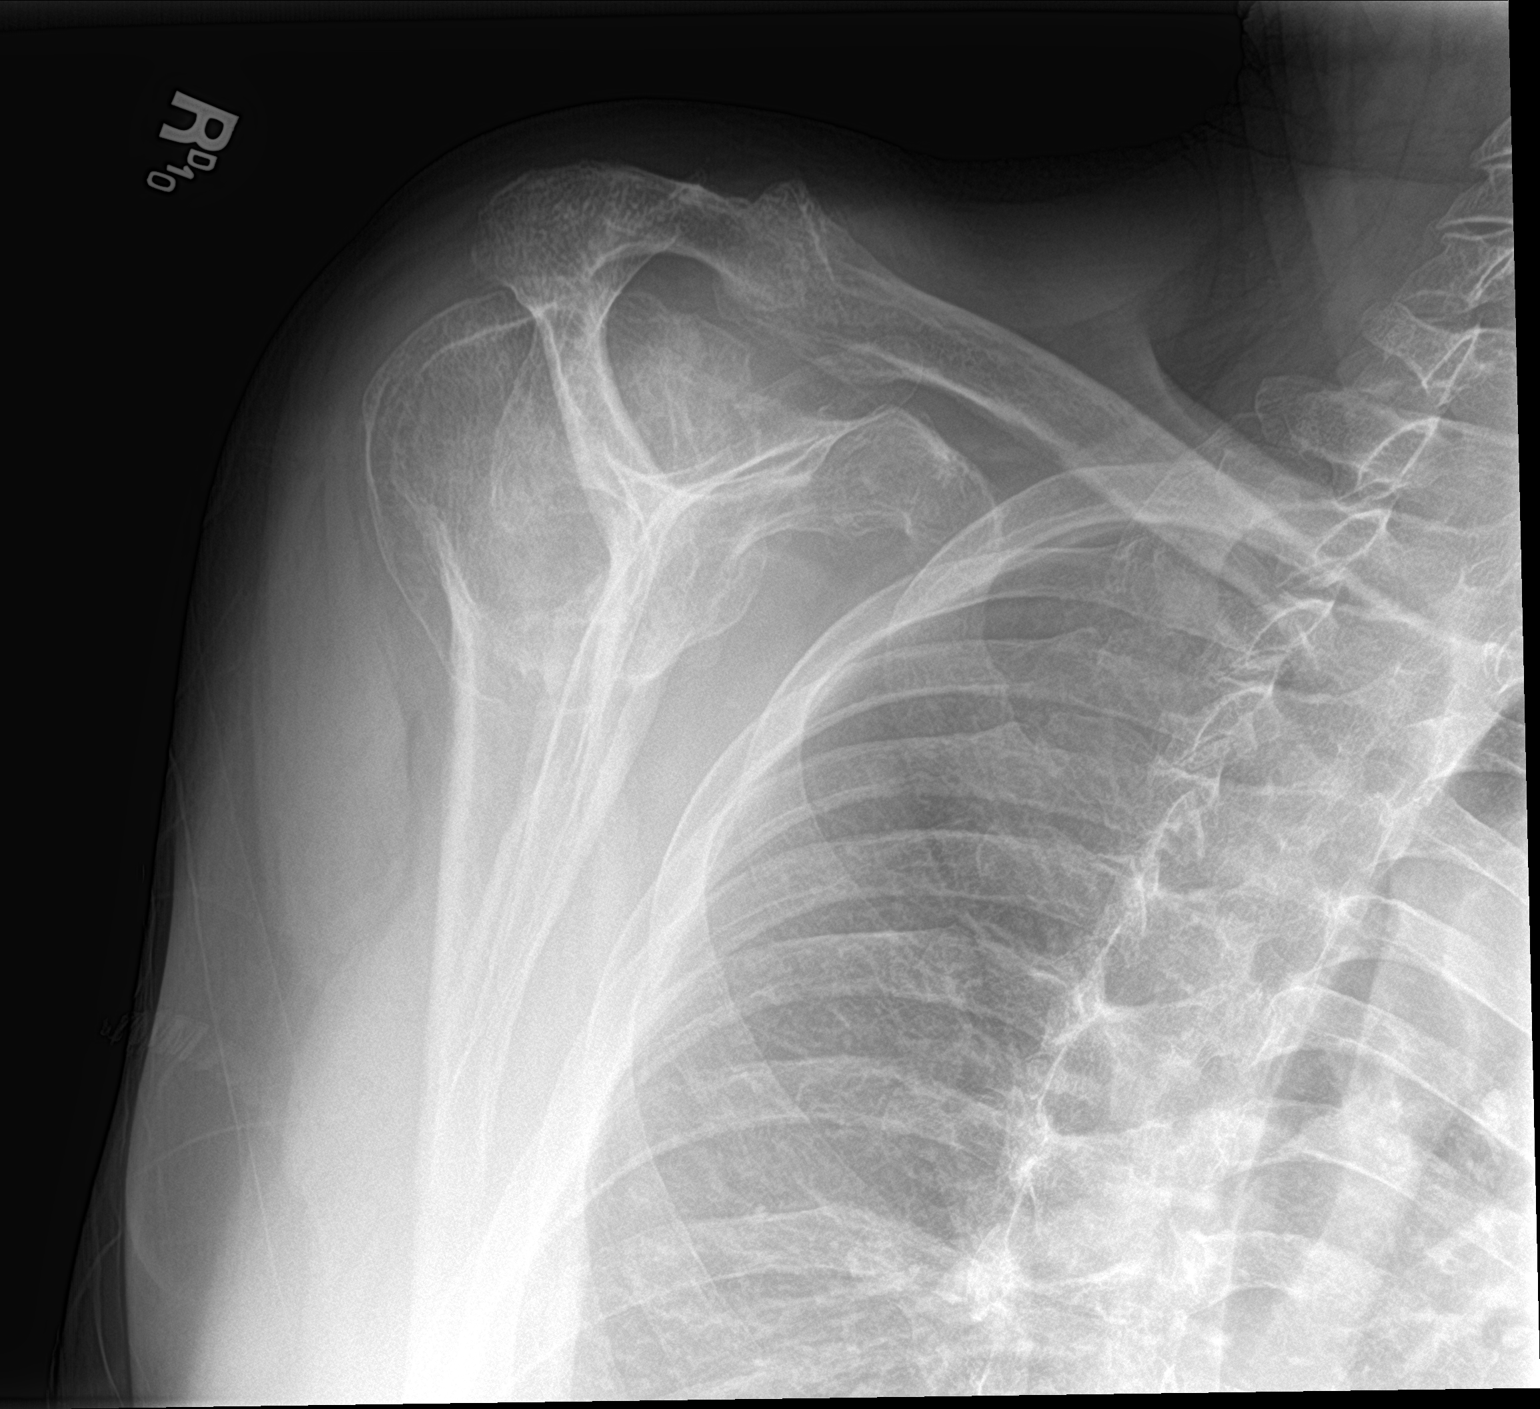

[2 of 2 positions shown; findings below may reference images not displayed]

FINDINGS: Severe glenohumeral joint space narrowing with bone-on-bone contact
subchondral sclerosis, large humeral head-neck junction degenerative
osteophytosis, moderate peripheral glenoid degenerative
osteophytosis, and moderate medial humeral head and glenoid fossa
subcortical flattening/remodeling.

Moderate acromioclavicular joint space narrowing and peripheral
osteophytosis.

No acute fracture or dislocation. The visualized portion of the
right lung is unremarkable.
IMPRESSION: 1. Markedly severe glenohumeral osteoarthritis.
2. Moderate acromioclavicular osteoarthritis.

## 2022-06-14 ENCOUNTER — Encounter: Payer: Self-pay | Admitting: Urology

## 2022-06-14 ENCOUNTER — Ambulatory Visit: Payer: No Typology Code available for payment source | Admitting: Urology

## 2022-06-14 VITALS — BP 134/84 | HR 76

## 2022-06-14 DIAGNOSIS — R3915 Urgency of urination: Secondary | ICD-10-CM

## 2022-06-14 DIAGNOSIS — N3281 Overactive bladder: Secondary | ICD-10-CM

## 2022-06-14 DIAGNOSIS — R339 Retention of urine, unspecified: Secondary | ICD-10-CM

## 2022-06-14 DIAGNOSIS — N3941 Urge incontinence: Secondary | ICD-10-CM | POA: Diagnosis not present

## 2022-06-14 LAB — BLADDER SCAN AMB NON-IMAGING

## 2022-06-14 NOTE — Progress Notes (Unsigned)
06/14/2022 12:37 PM   Mario Massey [birth year] [id number]  Referring provider: Benetta Spar, MD [address] [name],  [place] [address]  Followup BPH  HPI: Mr Mario Massey is a 77yo here for followup for BPH with urinary retention. PVR 4cc. He has [place] urgency and urge incontinence. He is using 6 pads a day which are soaked. The issue is he does not get up to go to urinate.    PMH: Past Medical History:  Diagnosis Date   Ascending aorta dilation (HCC)    Atrial flutter (HCC)    AV dissociation    Bipolar disorder (HCC)    Coronary artery disease    Dementia [facility])    daughter states he asks alot of questions when he is nervous.   Depression    Essential hypertension    Foley catheter in place    Hyperlipidemia LDL goal <70    Hypertension    Myocardial infarction [facility])    Presence of permanent cardiac pacemaker    RBBB    Second degree AV block, Mobitz type I     Surgical History: Past Surgical History:  Procedure Laterality Date   CYSTOSCOPY N/A 04/15/2022   Procedure: CYSTOSCOPY;  Surgeon: Mario Gauze, MD;  Location: [facility];  Service: Urology;  Laterality: N/A;   KNEE SURGERY     with replacement on right knee   LEFT HEART CATH AND CORONARY ANGIOGRAPHY N/A 07/18/2021   Procedure: LEFT HEART CATH AND CORONARY ANGIOGRAPHY;  Surgeon: Mario Bollman, MD;  Location: [facility] INVASIVE CV LAB;  Service: Cardiovascular;  Laterality: N/A;   PACEMAKER IMPLANT N/A 12/31/2021   Procedure: PACEMAKER IMPLANT;  Surgeon: Mario Maw, MD;  Location: [facility] INVASIVE CV LAB;  Service: Cardiovascular;  Laterality: N/A;   TRANSURETHRAL RESECTION OF PROSTATE N/A 04/15/2022   Procedure: TRANSURETHRAL RESECTION OF THE PROSTATE (TURP);  Surgeon: Mario Gauze, MD;  Location: [facility];  Service: Urology;  Laterality: N/A;    Home Medications:  Allergies as of 06/14/2022   No Known Allergies      Medication List        Accurate as of Jun 14, 2022 12:37 PM.  If you have any questions, ask your nurse or doctor.          STOP taking these medications    ciprofloxacin 500 MG tablet Commonly known as: CIPRO Stopped by: Mario Aye, MD   tamsulosin 0.4 MG Caps capsule Commonly known as: FLOMAX Stopped by: Mario Aye, MD   ziprasidone 20 MG capsule Commonly known as: GEODON Stopped by: Mario Aye, MD       TAKE these medications    AMBULATORY NON FORMULARY MEDICATION Medication Name: Depend briefs   atorvastatin 40 MG tablet Commonly known as: LIPITOR Take 1 tablet (40 mg total) by mouth daily.   bacitracin ointment Apply topically 2 (two) times daily.   divalproex 250 MG 24 hr tablet Commonly known as: DEPAKOTE ER Take 250 mg by mouth daily.   lisinopril 5 MG tablet Commonly known as: ZESTRIL Take 1 tablet (5 mg total) by mouth daily.   multivitamin with minerals Tabs tablet Take 1 tablet by mouth daily.   nitroGLYCERIN 0.4 MG SL tablet Commonly known as: NITROSTAT Place 1 tablet (0.4 mg total) under the tongue every 5 (five) minutes as needed for chest pain.   venlafaxine XR 150 MG 24 hr capsule Commonly known as: EFFEXOR-XR Take 1 capsule (150 mg total) by mouth daily.   warfarin 5 MG tablet  Commonly known as: COUMADIN Take 1 tablet (5 mg total) by mouth daily.        Allergies: No Known Allergies  Family History: Family History  Problem Relation Age of Onset   Alzheimer's disease Neg Hx    Parkinson's disease Neg Hx     Social History:  reports that he has never smoked. He has never been exposed to tobacco smoke. He has never used smokeless tobacco. He reports current alcohol use. He reports that he does not use drugs.  ROS: All other review of systems were reviewed and are negative except what is noted above in HPI  Physical Exam: BP 134/84   Pulse 76   Constitutional:  Alert and oriented, No acute distress. HEENT: [place] AT, moist mucus membranes.  Trachea midline, no  masses. Cardiovascular: No clubbing, cyanosis, or edema. Respiratory: Normal respiratory effort, no increased work of breathing. GI: Abdomen is soft, nontender, nondistended, no abdominal masses GU: No CVA tenderness.  Lymph: No cervical or inguinal lymphadenopathy. Skin: No rashes, bruises or suspicious lesions. Neurologic: Grossly intact, no focal deficits, moving all 4 extremities. Psychiatric: Normal mood and affect.  Laboratory Data: Lab Results  Component Value Date   WBC 7.7 04/20/2022   HGB 13.0 04/20/2022   HCT 39.6 04/20/2022   MCV 94.5 04/20/2022   PLT 161 04/20/2022    Lab Results  Component Value Date   CREATININE 0.79 04/20/2022    No results found for: "PSA"  No results found for: "TESTOSTERONE"  Lab Results  Component Value Date   HGBA1C 5.6 03/12/2022    Urinalysis    Component Value Date/Time   COLORURINE AMBER (A) 04/20/2022 1740   APPEARANCEUR CLOUDY (A) 04/20/2022 1740   APPEARANCEUR Cloudy (A) 11/13/2021 1447   LABSPEC 1.017 04/20/2022 1740   PHURINE 6.0 04/20/2022 1740   GLUCOSEU NEGATIVE 04/20/2022 1740   HGBUR LARGE (A) 04/20/2022 1740   BILIRUBINUR NEGATIVE 04/20/2022 1740   BILIRUBINUR Negative 11/13/2021 1447   KETONESUR NEGATIVE 04/20/2022 1740   PROTEINUR 100 (A) 04/20/2022 1740   UROBILINOGEN 1.0 06/07/2012 1313   NITRITE NEGATIVE 04/20/2022 1740   LEUKOCYTESUR MODERATE (A) 04/20/2022 1740    Lab Results  Component Value Date   LABMICR See below: 11/13/2021   WBCUA >30 (A) 11/13/2021   LABEPIT 0-10 11/13/2021   BACTERIA FEW (A) 04/20/2022    Pertinent Imaging:  No results found for this or any previous visit.  No results found for this or any previous visit.  No results found for this or any previous visit.  No results found for this or any previous visit.  Results for orders placed during the hospital encounter of 04/18/21  US RENAL  Narrative CLINICAL DATA:  AK I, chronic Foley catheter.  EXAM: RENAL /  URINARY TRACT ULTRASOUND COMPLETE  COMPARISON:  CT April 19, 2021  FINDINGS: Right Kidney:  Renal measurements: 12.1 x 7.7 x 6.6 cm = volume: 324 mL. Echogenicity within normal limits. No mass or hydronephrosis visualized.  Left Kidney:  Renal measurements: 12.4 x 6.9 x 6.4 cm = volume: 287 mL. Echogenicity within normal limits. No mass or hydronephrosis visualized.  Bladder:  Decompressed around a Foley catheter.  Other:  None.  IMPRESSION: NO hydronephrosis.   Electronically Signed By: Maudry Mayhew M.D. On: 04/20/2021 16:17  No valid procedures specified. No results found for this or any previous visit.  Results for orders placed during the hospital encounter of 04/02/22  CT Renal Stone Study  Narrative CLINICAL DATA:  Abdominal/flank pain, stone suspected worsening lower abd / penis pain, indwelling cath  EXAM: CT ABDOMEN AND PELVIS WITHOUT CONTRAST  TECHNIQUE: Multidetector CT imaging of the abdomen and pelvis was performed following the standard protocol without IV contrast.  RADIATION DOSE REDUCTION: This exam was performed according to the departmental dose-optimization program which includes automated exposure control, adjustment of the mA and/or kV according to patient size and/or use of iterative reconstruction technique.  COMPARISON:  None Available.  FINDINGS: Lower chest: No acute abnormality.  Hepatobiliary: Cholelithiasis without evidence of acute inflammatory change. Unremarkable appearance of the liver. No evidence of biliary dilation.  Pancreas: Unremarkable. No pancreatic ductal dilatation or surrounding inflammatory changes.  Spleen: Normal in size without focal abnormality.  Adrenals/Urinary Tract: Adrenal glands are unremarkable. Kidneys are normal, without renal calculi, focal lesion, or hydronephrosis. Bladder is decompressed around a Foley catheter. Small contained fluid collection versus diverticulum arising from the  bladder is unchanged.  Stomach/Bowel: Unremarkable appearance of the stomach. No dilated bowel loops to suggest obstruction. Diverticulosis without evidence diverticulitis. No findings to suggest appendicitis.  Vascular/Lymphatic: Aortic atherosclerosis. No enlarged abdominal or pelvic lymph nodes.  Reproductive: Prostate is unremarkable.  Other: Unchanged umbilical hernia which contains fat. No abdominopelvic ascites.  Musculoskeletal: Multilevel degenerative change.  IMPRESSION: 1. No evidence of acute abnormality. 2. Cholelithiasis. 3. Diverticulosis. 4. Small contained fluid collection versus diverticulum arising from the bladder is unchanged.   Electronically Signed By: Feliberto Harts M.D. On: 04/02/2022 13:32   Assessment & Plan:    1. Urinary retention -resolved - Urinalysis, Routine w reflex microscopic - Bladder Scan (Post Void Residual) in office  2. Urinary urgency -timed voiding  3. OAB (overactive bladder) Timed voiding   No follow-ups on file.  Mario Aye, MD  [facility] [place]

## 2022-07-01 ENCOUNTER — Encounter: Payer: Self-pay | Admitting: Neurology

## 2022-07-01 ENCOUNTER — Telehealth: Payer: Self-pay

## 2022-07-01 NOTE — Telephone Encounter (Signed)
HST ordered.

## 2022-07-01 NOTE — Telephone Encounter (Signed)
Patient's daughter called advising that they wished to place catheter back in. Patient is unable to control his bladder and his daughter feels his quality of life would improve if catheter was placed.

## 2022-07-01 NOTE — Addendum Note (Signed)
Addended by: Huston Foley on: 07/01/2022 02:31 PM   Modules accepted: Orders

## 2022-07-01 NOTE — Telephone Encounter (Signed)
See below, please advise.

## 2022-07-02 ENCOUNTER — Ambulatory Visit (INDEPENDENT_AMBULATORY_CARE_PROVIDER_SITE_OTHER): Payer: No Typology Code available for payment source

## 2022-07-02 DIAGNOSIS — I442 Atrioventricular block, complete: Secondary | ICD-10-CM

## 2022-07-02 LAB — CUP PACEART REMOTE DEVICE CHECK
Battery Remaining Longevity: 98 mo
Battery Remaining Percentage: 95 %
Battery Voltage: 3.02 V
Brady Statistic AP VP Percent: 0 %
Brady Statistic AP VS Percent: 0 %
Brady Statistic AS VP Percent: 0 %
Brady Statistic AS VS Percent: 0 %
Brady Statistic RA Percent Paced: 0 %
Brady Statistic RV Percent Paced: 96 %
Date Time Interrogation Session: 20240604040012
Implantable Lead Connection Status: 753985
Implantable Lead Connection Status: 753985
Implantable Lead Implant Date: 20231204
Implantable Lead Implant Date: 20231204
Implantable Lead Location: 753859
Implantable Lead Location: 753860
Implantable Pulse Generator Implant Date: 20231204
Lead Channel Impedance Value: 430 Ohm
Lead Channel Impedance Value: 440 Ohm
Lead Channel Pacing Threshold Amplitude: 0.5 V
Lead Channel Pacing Threshold Amplitude: 0.75 V
Lead Channel Pacing Threshold Pulse Width: 0.5 ms
Lead Channel Pacing Threshold Pulse Width: 0.5 ms
Lead Channel Sensing Intrinsic Amplitude: 2.1 mV
Lead Channel Sensing Intrinsic Amplitude: 9.5 mV
Lead Channel Setting Pacing Amplitude: 2 V
Lead Channel Setting Pacing Amplitude: 2.5 V
Lead Channel Setting Pacing Pulse Width: 0.5 ms
Lead Channel Setting Sensing Sensitivity: 0.5 mV
Pulse Gen Model: 2272
Pulse Gen Serial Number: 8111795

## 2022-07-02 NOTE — Telephone Encounter (Signed)
Can you schedule an office visit per NP Thanks!

## 2022-07-09 ENCOUNTER — Ambulatory Visit (INDEPENDENT_AMBULATORY_CARE_PROVIDER_SITE_OTHER): Payer: No Typology Code available for payment source | Admitting: Urology

## 2022-07-09 ENCOUNTER — Ambulatory Visit: Payer: No Typology Code available for payment source | Admitting: Urology

## 2022-07-09 ENCOUNTER — Encounter: Payer: Self-pay | Admitting: Urology

## 2022-07-09 VITALS — BP 141/85 | HR 69

## 2022-07-09 DIAGNOSIS — R3989 Other symptoms and signs involving the genitourinary system: Secondary | ICD-10-CM | POA: Diagnosis not present

## 2022-07-09 DIAGNOSIS — N529 Male erectile dysfunction, unspecified: Secondary | ICD-10-CM | POA: Insufficient documentation

## 2022-07-09 DIAGNOSIS — N3281 Overactive bladder: Secondary | ICD-10-CM

## 2022-07-09 DIAGNOSIS — E782 Mixed hyperlipidemia: Secondary | ICD-10-CM | POA: Insufficient documentation

## 2022-07-09 DIAGNOSIS — Z9079 Acquired absence of other genital organ(s): Secondary | ICD-10-CM | POA: Diagnosis not present

## 2022-07-09 DIAGNOSIS — Z9181 History of falling: Secondary | ICD-10-CM | POA: Insufficient documentation

## 2022-07-09 DIAGNOSIS — N62 Hypertrophy of breast: Secondary | ICD-10-CM | POA: Insufficient documentation

## 2022-07-09 DIAGNOSIS — G20A1 Parkinson's disease without dyskinesia, without mention of fluctuations: Secondary | ICD-10-CM

## 2022-07-09 DIAGNOSIS — N3941 Urge incontinence: Secondary | ICD-10-CM

## 2022-07-09 DIAGNOSIS — R3915 Urgency of urination: Secondary | ICD-10-CM | POA: Insufficient documentation

## 2022-07-09 DIAGNOSIS — F0280 Dementia in other diseases classified elsewhere without behavioral disturbance: Secondary | ICD-10-CM

## 2022-07-09 DIAGNOSIS — Z77090 Contact with and (suspected) exposure to asbestos: Secondary | ICD-10-CM | POA: Insufficient documentation

## 2022-07-09 DIAGNOSIS — H903 Sensorineural hearing loss, bilateral: Secondary | ICD-10-CM | POA: Insufficient documentation

## 2022-07-09 DIAGNOSIS — M109 Gout, unspecified: Secondary | ICD-10-CM | POA: Insufficient documentation

## 2022-07-09 DIAGNOSIS — K5730 Diverticulosis of large intestine without perforation or abscess without bleeding: Secondary | ICD-10-CM | POA: Insufficient documentation

## 2022-07-09 DIAGNOSIS — K649 Unspecified hemorrhoids: Secondary | ICD-10-CM | POA: Insufficient documentation

## 2022-07-09 DIAGNOSIS — N400 Enlarged prostate without lower urinary tract symptoms: Secondary | ICD-10-CM

## 2022-07-09 DIAGNOSIS — R262 Difficulty in walking, not elsewhere classified: Secondary | ICD-10-CM

## 2022-07-09 LAB — BLADDER SCAN AMB NON-IMAGING

## 2022-07-09 NOTE — Progress Notes (Signed)
History of Present Illness: Mario Massey is a 77 y.o. male who presents today for follow up visit at [facility]. He is accompanied by his daughter Mario Massey, who gave today's history due to patient's dementia. He has been evaluated by [facility] and was diagnosed with Parkinson's disease and is currently undergoing further evaluation regarding his memory loss and has a sleep study scheduled in the near future. - GU history: 1.  BPH.  - 04/15/2022: Underwent TURP by Dr. Ronne Binning. 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 Using multiple pads per day.   At last visit with Dr. Ronne Binning on 06/14/2022: - "PVR 4cc. He has [place] urgency and urge incontinence. He is using 6 pads a day which are soaked. The issue is he does not get up to go to urinate." - The plan was: Timed voiding to address incontinence.  Today: His daughter reports that ever since his TURP that Mario Massey has had uncontrollable urinary incontinence. He leaks urine constantly and saturates multiple diapers per day. At times he forgets to wear one, "puts it on sideways", or removes them. It has created significant stress for both of them. Patient states he has minimal or zero warning from the bladder that it is time to urinate.   His daughter states his genital skin is red and irritated due to the retained moisture in his Depends. Patient denies dysuria, gross hematuria, straining to void, or sensations of incomplete emptying.   He reports some caffeine intake but not a highly significant amount. Denies intake of other bladder irritants such as alcohol, spicy or acidic foods in large quantities.    Fall Screening: Do you usually have a device to assist in your mobility? No, but does have a cane and a walker which he uses at times. Reports history of 3 falls in the past.   Medications: Current Outpatient Medications  Medication Sig Dispense Refill   AMBULATORY NON FORMULARY MEDICATION  Medication Name: Depend briefs 2 Package 12   atorvastatin (LIPITOR) 40 MG tablet Take 1 tablet (40 mg total) by mouth daily. 90 tablet 3   bacitracin ointment Apply topically 2 (two) times daily. 120 g 0   divalproex (DEPAKOTE ER) 250 MG 24 hr tablet Take 250 mg by mouth daily.     lisinopril (ZESTRIL) 5 MG tablet Take 1 tablet (5 mg total) by mouth daily. 30 tablet 0   Multiple Vitamin (MULTIVITAMIN WITH MINERALS) TABS tablet Take 1 tablet by mouth daily.     nitroGLYCERIN (NITROSTAT) 0.4 MG SL tablet Place 1 tablet (0.4 mg total) under the tongue every 5 (five) minutes as needed for chest pain. 25 tablet 3   venlafaxine XR (EFFEXOR-XR) 150 MG 24 hr capsule Take 1 capsule (150 mg total) by mouth daily. 30 capsule 0   warfarin (COUMADIN) 5 MG tablet Take 1 tablet (5 mg total) by mouth daily. 30 tablet 3   No current facility-administered medications for this visit.    Allergies: No Known Allergies  Past Medical History:  Diagnosis Date   Ascending aorta dilation (HCC)    Atrial flutter (HCC)    AV dissociation    Bipolar disorder (HCC)    Coronary artery disease    Dementia [facility])    daughter states he asks alot of questions when he is nervous.   Depression    Essential hypertension    Foley catheter in place    Hyperlipidemia LDL goal <70    Hypertension  Myocardial infarction [facility])    Presence of permanent cardiac pacemaker    RBBB    Second degree AV block, Mobitz type I    Past Surgical History:  Procedure Laterality Date   CYSTOSCOPY N/A 04/15/2022   Procedure: CYSTOSCOPY;  Surgeon: Malen Gauze, MD;  Location: [facility];  Service: Urology;  Laterality: N/A;   KNEE SURGERY     with replacement on right knee   LEFT HEART CATH AND CORONARY ANGIOGRAPHY N/A 07/18/2021   Procedure: LEFT HEART CATH AND CORONARY ANGIOGRAPHY;  Surgeon: Tonny Bollman, MD;  Location[facility] INVASIVE CV LAB;  Service: Cardiovascular;  Laterality: N/A;   PACEMAKER IMPLANT N/A 12/31/2021    Procedure: PACEMAKER IMPLANT;  Surgeon: Marinus Maw, MD;  Location: [facility] INVASIVE CV LAB;  Service: Cardiovascular;  Laterality: N/A;   TRANSURETHRAL RESECTION OF PROSTATE N/A 04/15/2022   Procedure: TRANSURETHRAL RESECTION OF THE PROSTATE (TURP);  Surgeon: Malen Gauze, MD;  Location: [facility];  Service: Urology;  Laterality: N/A;   Family History  Problem Relation Age of Onset   Alzheimer's disease Neg Hx    Parkinson's disease Neg Hx    Social History   Socioeconomic History   Marital status: Widowed    Spouse name: Not on file   Number of children: Not on file   Years of education: Not on file   Highest education level: Not on file  Occupational History   Not on file  Tobacco Use   Smoking status: Never    Passive exposure: Never   Smokeless tobacco: Never  Vaping Use   Vaping Use: Never used  Substance and Sexual Activity   Alcohol use: Yes    Comment: occasional   Drug use: No   Sexual activity: Not Currently  Other Topics Concern   Not on file  Social History Narrative   Not on file   Social Determinants of Health   Financial Resource Strain: Not on file  Food Insecurity: No Food Insecurity (04/15/2022)   Hunger Vital Sign    Worried About Running Out of Food in the Last Year: Never true    Ran Out of Food in the Last Year: Never true  Transportation Needs: No Transportation Needs (04/15/2022)   PRAPARE - Administrator, Civil Service (Medical): No    Lack of Transportation (Non-Medical): No  Physical Activity: Not on file  Stress: Not on file  Social Connections: Not on file  Intimate Partner Violence: Not At Risk (04/15/2022)   Humiliation, Afraid, Rape, and Kick questionnaire    Fear of Current or Ex-Partner: No    Emotionally Abused: No    Physically Abused: No    Sexually Abused: No    Limited Review of Systems lntegumentary: As per HPI Gastrointestinal: Patient denies nausea, vomiting, constipation, or diarrhea Musculoskeletal:  Patient reports difficulty walking at times Neurologic: Patient reports dementia Psychiatric: Patient reports memory problems Hematologic/Lymphatic: Patient denies bleeding tendencies  GU: As per HPI.  OBJECTIVE Vitals:   07/09/22 1303  BP: (!) 141/85  Pulse: 69   There is no height or weight on file to calculate BMI.  Physical Examination  Constitutional: No obvious distress; patient is non-toxic appearing  Cardiovascular: No visible lower extremity edema.  Respiratory: The patient does not have audible wheezing/stridor; respirations do not appear labored  Gastrointestinal: Abdomen non-distended Musculoskeletal: Normal ROM of UEs  Skin: No obvious rashes/open sores  Neurologic: CN 2-12 grossly intact Psychiatric: Answered questions appropriately with normal affect  Hematologic/Lymphatic/Immunologic: No  obvious bruises or sites of spontaneous bleeding  PVR: 30 ml  ASSESSMENT Urge incontinence - Plan: Urinalysis, Routine w reflex microscopic, Bladder Scan (Post Void Residual) in office  Absence of bladder sensation  OAB (overactive bladder) - Plan: Urinalysis, Routine w reflex microscopic, Bladder Scan (Post Void Residual) in office  Benign prostatic hyperplasia without lower urinary tract symptoms  S/P TURP  Dementia due to Parkinson's disease, unspecified dementia severity, unspecified whether behavioral, psychotic, or mood disturbance or anxiety (HCC)  Ambulatory dysfunction  History of falling   We discussed that there has been no evidence of urinary retention / overflow urinary incontinence given that his PVR has been normal x2 between today & last visit. We discussed that long term use of an indwelling Foley catheter use for management of urge incontinence is not advised due to the risk for erosion of the urethral sphincter, which may result in irreversible gross urinary incontinence over time. His daughter said that Mario Massey had an indwelling Foley catheter for  5 year prior to his TURP procedure in March 2024.   We discussed the symptoms of overactive bladder (OAB), which include urinary urgency, frequency, nocturia, with or without urge incontinence. While we may not know the exact etiology of OAB, several risk factors can be identified. We discussed this patient's neurogenic risk factors for OAB-type symptoms including dementia.   We discussed the following management options in detail including potential benefits, risks, and side effects: Behavioral therapy: Decreasing bladder irritants (such as caffeine, acidic foods, spicy foods, alcohol): Already avoiding these Bladder retraining / timed voiding: Tried, unsuccessful Medication(s): Anticholinergic medications: Not a safe candidate for these due to age + comorbidities (including pre-exisiting dementia) Beta-3 adrenergic agonist medications  For refractory cases: PTNS (posterior tibial nerve stimulation) Sacral neuromodulation trial (Medtronic lnterStim or Axonics implant) Bladder Botox injections  For beta-3 agonist medication, we discussed the risk for urinary retention and the potential side effect of elevated blood pressure specific to Myrbetriq (which is more likely to occur in individuals with uncontrolled hypertension).   They decided to proceed with trial of Gemtesa 75 mg daily, continue with behavioral modifications (timed voiding and avoiding dietary bladder irritants), and follow up with Dr. Ronne Binning in 8 weeks. Will consult with Dr. Ronne Binning in the meantime to ask if he has any additional recommendations at this time and notify patient / daughter of his comments. Pt verbalized understanding and agreement. All questions were answered.  Will plan for follow up in 8 weeks with Dr. Ronne Binning. Patient / daughter verbalized understanding and agreement. All questions were answered.  PLAN Advised the following: 1. Start Gemtesa 75 mg daily. 2. Continue behavioral modifications (timed voiding  and avoiding dietary bladder irritants). 3. Return in about 8 weeks (around 09/03/2022) for f/u with Dr. Ronne Binning.  Orders Placed This Encounter  Procedures   Urinalysis, Routine w reflex microscopic   Bladder Scan (Post Void Residual) in office   I spent over 30 minutes either in face-to-face time with this patient or reviewing / documenting in this patient's chart on the date of this visit. Over 50% of that time was spent in direct counseling. E&M based on time and complexity of medical decision making.  It has been explained that the patient is to follow regularly with their PCP in addition to all other providers involved in their care and to follow instructions provided by these respective offices. Patient advised to contact urology clinic if any urologic-pertaining questions, concerns, new symptoms or problems arise in the interim  period.  There are no Patient Instructions on file for this visit.  Electronically signed by:  Donnita Falls, FNP   07/09/22    5:00 PM

## 2022-07-11 ENCOUNTER — Ambulatory Visit: Payer: No Typology Code available for payment source | Attending: Internal Medicine | Admitting: Internal Medicine

## 2022-07-11 ENCOUNTER — Encounter: Payer: Self-pay | Admitting: Internal Medicine

## 2022-07-11 VITALS — BP 128/68 | HR 69 | Ht 72.0 in | Wt 260.4 lb

## 2022-07-11 DIAGNOSIS — I4819 Other persistent atrial fibrillation: Secondary | ICD-10-CM | POA: Diagnosis not present

## 2022-07-11 LAB — CUP PACEART INCLINIC DEVICE CHECK
Battery Remaining Longevity: 102 mo
Battery Voltage: 3.02 V
Brady Statistic RA Percent Paced: 0 %
Brady Statistic RV Percent Paced: 96 %
Date Time Interrogation Session: 20240613155249
Implantable Lead Connection Status: 753985
Implantable Lead Connection Status: 753985
Implantable Lead Implant Date: 20231204
Implantable Lead Implant Date: 20231204
Implantable Lead Location: 753859
Implantable Lead Location: 753860
Implantable Pulse Generator Implant Date: 20231204
Lead Channel Impedance Value: 450 Ohm
Lead Channel Impedance Value: 475 Ohm
Lead Channel Pacing Threshold Amplitude: 0.75 V
Lead Channel Pacing Threshold Amplitude: 0.75 V
Lead Channel Pacing Threshold Pulse Width: 0.5 ms
Lead Channel Pacing Threshold Pulse Width: 0.5 ms
Lead Channel Sensing Intrinsic Amplitude: 11.9 mV
Lead Channel Sensing Intrinsic Amplitude: 2 mV
Lead Channel Setting Pacing Amplitude: 2 V
Lead Channel Setting Pacing Amplitude: 2.5 V
Lead Channel Setting Pacing Pulse Width: 0.5 ms
Lead Channel Setting Sensing Sensitivity: 0.5 mV
Pulse Gen Model: 2272
Pulse Gen Serial Number: 8111795

## 2022-07-11 NOTE — Progress Notes (Signed)
HPI  No Known Allergies  [name] returns today for ongoing evaluation s/p PPM insertion. He is a pleasant 77 yo man with AFlutter and intermittent CHB. He also has mild dementia. He denies a h/o syncope. No chest pain or sob. He has periods of weakness which I suspect is due to his bradycardia. These have improved.  He had not been taking his xarelto and was switched to warfarin when I saw him about 3 months ago. He has class 2 dyspnea. He notes that his cat scratches his legs. Current Outpatient Medications  Medication Sig Dispense Refill   AMBULATORY NON FORMULARY MEDICATION Medication Name: Depend briefs 2 Package 12   atorvastatin (LIPITOR) 40 MG tablet Take 1 tablet (40 mg total) by mouth daily. 90 tablet 3   bacitracin ointment Apply topically 2 (two) times daily. 120 g 0   divalproex (DEPAKOTE ER) 250 MG 24 hr tablet Take 250 mg by mouth daily.     lisinopril (ZESTRIL) 5 MG tablet Take 1 tablet (5 mg total) by mouth daily. 30 tablet 0   Multiple Vitamin (MULTIVITAMIN WITH MINERALS) TABS tablet Take 1 tablet by mouth daily.     nitroGLYCERIN (NITROSTAT) 0.4 MG SL tablet Place 1 tablet (0.4 mg total) under the tongue every 5 (five) minutes as needed for chest pain. 25 tablet 3   venlafaxine XR (EFFEXOR-XR) 150 MG 24 hr capsule Take 1 capsule (150 mg total) by mouth daily. 30 capsule 0   Vibegron (GEMTESA) 75 MG TABS Take 1 tablet by mouth daily.     warfarin (COUMADIN) 5 MG tablet Take 1 tablet (5 mg total) by mouth daily. 30 tablet 3   No current facility-administered medications for this visit.     Past Medical History:  Diagnosis Date   Ascending aorta dilation (HCC)    Atrial flutter (HCC)    AV dissociation    Bipolar disorder (HCC)    Coronary artery disease    Dementia [facility])    daughter states he asks alot of questions when he is nervous.   Depression    Essential hypertension    Foley catheter in place    Hyperlipidemia LDL goal <70    Hypertension     Myocardial infarction [facility])    Presence of permanent cardiac pacemaker    RBBB    Second degree AV block, Mobitz type I     ROS:   All systems reviewed and negative except as noted in the HPI.   Past Surgical History:  Procedure Laterality Date   CYSTOSCOPY N/A 04/15/2022   Procedure: CYSTOSCOPY;  Surgeon: Malen Gauze, MD;  Location: [facility];  Service: Urology;  Laterality: N/A;   KNEE SURGERY     with replacement on right knee   LEFT HEART CATH AND CORONARY ANGIOGRAPHY N/A 07/18/2021   Procedure: LEFT HEART CATH AND CORONARY ANGIOGRAPHY;  Surgeon: Tonny Bollman, MD;  Location: [facility] INVASIVE CV LAB;  Service: Cardiovascular;  Laterality: N/A;   PACEMAKER IMPLANT N/A 12/31/2021   Procedure: PACEMAKER IMPLANT;  Surgeon: Marinus Maw, MD;  Location: [facility] INVASIVE CV LAB;  Service: Cardiovascular;  Laterality: N/A;   TRANSURETHRAL RESECTION OF PROSTATE N/A 04/15/2022   Procedure: TRANSURETHRAL RESECTION OF THE PROSTATE (TURP);  Surgeon: Malen Gauze, MD;  Location: [facility];  Service: Urology;  Laterality: N/A;     Family History  Problem Relation Age of Onset   Alzheimer's disease Neg Hx    Parkinson's disease Neg Hx  Social History   Socioeconomic History   Marital status: Widowed    Spouse name: Not on file   Number of children: Not on file   Years of education: Not on file   Highest education level: Not on file  Occupational History   Not on file  Tobacco Use   Smoking status: Never    Passive exposure: Never   Smokeless tobacco: Never  Vaping Use   Vaping Use: Never used  Substance and Sexual Activity   Alcohol use: Yes    Comment: occasional   Drug use: No   Sexual activity: Not Currently  Other Topics Concern   Not on file  Social History Narrative   Not on file   Social Determinants of Health   Financial Resource Strain: Not on file  Food Insecurity: No Food Insecurity (04/15/2022)   Hunger Vital Sign    Worried About Running Out of Food  in the Last Year: Never true    Ran Out of Food in the Last Year: Never true  Transportation Needs: No Transportation Needs (04/15/2022)   PRAPARE - Administrator, Civil Service (Medical): No    Lack of Transportation (Non-Medical): No  Physical Activity: Not on file  Stress: Not on file  Social Connections: Not on file  Intimate Partner Violence: Not At Risk (04/15/2022)   Humiliation, Afraid, Rape, and Kick questionnaire    Fear of Current or Ex-Partner: No    Emotionally Abused: No    Physically Abused: No    Sexually Abused: No     BP 128/68   Pulse 69   Ht 6' (1.829 m)   Wt 260 lb 6.4 oz (118.1 kg)   SpO2 97%   BMI 35.32 kg/m   Physical Exam:  Well appearing NAD HEENT: Unremarkable Neck:  No JVD, no thyromegally Lymphatics:  No adenopathy Back:  No CVA tenderness Lungs:  Clear HEART:  Regular rate rhythm, no murmurs, no rubs, no clicks Abd:  soft, positive bowel sounds, no organomegally, no rebound, no guarding Ext:  2 plus pulses, no edema, no cyanosis, no clubbing. Multiple sores in various stages of healing on both legs.  Skin:  No rashes no nodules Neuro:  CN II through XII intact, motor grossly intact  DEVICE  Normal device function.  See PaceArt for details.   Assess/Plan:  CHB - He is asymptomatic s/p PPM insertion. 2. PAF - he is mostly out of rhythm. He has started taking warfarin. 3. Coags - he has not been bleeding but is not taking his meds correctly. 4. PPM - his [employer] DDD PM is working normally.     Mario Massey Mario Lamere,MD

## 2022-07-11 NOTE — Patient Instructions (Signed)
Medication Instructions:  Your physician recommends that you continue on your current medications as directed. Please refer to the Current Medication list given to you today.  *If you need a refill on your cardiac medications before your next appointment, please call your pharmacy*   Lab Work: NONE   If you have labs (blood work) drawn today and your tests are completely normal, you will receive your results only by: MyChart Message (if you have MyChart) OR A paper copy in the mail If you have any lab test that is abnormal or we need to change your treatment, we will call you to review the results.   Testing/Procedures: NONE    Follow-Up: At [facility], you and your health needs are our priority.  As part of our continuing mission to provide you with exceptional heart care, we have created designated Provider Care Teams.  These Care Teams include your primary Cardiologist (physician) and Advanced Practice Providers (APPs -  Physician Assistants and Nurse Practitioners) who all work together to provide you with the care you need, when you need it.  We recommend signing up for the patient portal called "MyChart".  Sign up information is provided on this After Visit Summary.  MyChart is used to connect with patients for Virtual Visits (Telemedicine).  Patients are able to view lab/test results, encounter notes, upcoming appointments, etc.  Non-urgent messages can be sent to your provider as well.   To learn more about what you can do with MyChart, go to [email or web address].    Your next appointment:   1 year(s)  Provider:   Gregg Taylor, MD    Other Instructions Thank you for choosing [facility]!    

## 2022-07-23 ENCOUNTER — Ambulatory Visit: Payer: No Typology Code available for payment source | Admitting: Urology

## 2022-07-23 NOTE — Telephone Encounter (Signed)
[facility]- [facility] pending faxed notes

## 2022-07-25 ENCOUNTER — Ambulatory Visit: Payer: No Typology Code available for payment source

## 2022-07-25 NOTE — Progress Notes (Signed)
Remote pacemaker transmission.   

## 2022-07-29 ENCOUNTER — Encounter: Payer: Self-pay | Admitting: Neurology

## 2022-07-29 NOTE — Telephone Encounter (Signed)
Spoke with daughter  (checked DPR)  Informed daughter since patient has increased confusion to take Pt to PCP to get blood work and urine test done for UTI or possibility of other infections. Daughter states pt already has gone to PCP and she was not taking back PCP. Daughter states not taking her dad  through that because he has already been to PCP last week  and he already has bladder issues Daughter was upset because she felt like we were not helping patient. Informed daughter I was helping pt by asking her to take pt to PCP to get blood work and urine sample done to make sure pt doesn't have any infection. Informed daughter if pt does have infections and and remains untreated he can get septic( infection in bloodstream) Daughter was unsure if she was going to take pt back to PCP. Did give suggestion for daughter to call PCP to let them me aware what was going on with patient and maybe she can go get a specimen cup  to get a urine sample from patient and take it back to PCP. Daughter still unsure what she wanted to do. Daughter did ask about sleep study. Informed daughter order was sent to insurance 07/25/2022 and it usually takes 2 weeks for approval  Daughter thanked me for calling. Will send to Dr. Frances Furbish to make her aware .Will forward  Vickie Epley about  HST

## 2022-07-30 NOTE — Telephone Encounter (Signed)
Spoke with daughter . Gave Dr Frances Furbish recommendation for new onset symptoms take patient to ER . Per daughter refuses to take pt to ER  Daughter states pt was moved to another home a month ago do to decline of pt memory. . Daughter did agree to a sooner appointment in August  with Dr . Frances Furbish and pt placed on waitlist

## 2022-07-30 NOTE — Telephone Encounter (Signed)
You can offer FU appt with NP or me.  For any acute changes, I recommend daughter take him to the ER.

## 2022-08-06 ENCOUNTER — Ambulatory Visit: Payer: Medicare Other | Admitting: Neurology

## 2022-08-06 DIAGNOSIS — G4719 Other hypersomnia: Secondary | ICD-10-CM

## 2022-08-06 DIAGNOSIS — G2119 Other drug induced secondary parkinsonism: Secondary | ICD-10-CM

## 2022-08-06 DIAGNOSIS — Z789 Other specified health status: Secondary | ICD-10-CM

## 2022-08-06 DIAGNOSIS — Z9189 Other specified personal risk factors, not elsewhere classified: Secondary | ICD-10-CM

## 2022-08-06 DIAGNOSIS — R413 Other amnesia: Secondary | ICD-10-CM

## 2022-08-09 ENCOUNTER — Ambulatory Visit: Payer: No Typology Code available for payment source | Admitting: Urology

## 2022-08-09 VITALS — BP 120/71 | HR 70

## 2022-08-09 DIAGNOSIS — N3941 Urge incontinence: Secondary | ICD-10-CM | POA: Diagnosis not present

## 2022-08-09 NOTE — Progress Notes (Signed)
08/09/2022 11:17 AM   Mario Massey 17-Oct-1945 [id number]  Referring provider: Benetta Spar, MD [address] [place],  [place] [address]  Urinary incontinence   HPI: Mario Massey is a 77yo here for followup for BPH and urinary incontinence. He continues to soak numerous pads per day. He does not attempt to get up to go to the bathroom when he gets the urge to urinate. No dysuria. No hematuria. No UTI.     PMH: Past Medical History:  Diagnosis Date   Ascending aorta dilation (HCC)    Atrial flutter (HCC)    AV dissociation    Bipolar disorder (HCC)    Coronary artery disease    Dementia [facility])    daughter states he asks alot of questions when he is nervous.   Depression    Essential hypertension    Foley catheter in place    Hyperlipidemia LDL goal <70    Hypertension    Myocardial infarction [facility])    Presence of permanent cardiac pacemaker    RBBB    Second degree AV block, Mobitz type I     Surgical History: Past Surgical History:  Procedure Laterality Date   CYSTOSCOPY N/A 04/15/2022   Procedure: CYSTOSCOPY;  Surgeon: Malen Gauze, MD;  Location: [facility];  Service: Urology;  Laterality: N/A;   KNEE SURGERY     with replacement on right knee   LEFT HEART CATH AND CORONARY ANGIOGRAPHY N/A 07/18/2021   Procedure: LEFT HEART CATH AND CORONARY ANGIOGRAPHY;  Surgeon: Tonny Bollman, MD;  Location: [facility] INVASIVE CV LAB;  Service: Cardiovascular;  Laterality: N/A;   PACEMAKER IMPLANT N/A 12/31/2021   Procedure: PACEMAKER IMPLANT;  Surgeon: Marinus Maw, MD;  Location: [facility] INVASIVE CV LAB;  Service: Cardiovascular;  Laterality: N/A;   TRANSURETHRAL RESECTION OF PROSTATE N/A 04/15/2022   Procedure: TRANSURETHRAL RESECTION OF THE PROSTATE (TURP);  Surgeon: Malen Gauze, MD;  Location: [facility];  Service: Urology;  Laterality: N/A;    Home Medications:  Allergies as of 08/09/2022   No Known Allergies      Medication List        Accurate  as of August 09, 2022 11:17 AM. If you have any questions, ask your nurse or doctor.          AMBULATORY NON FORMULARY MEDICATION Medication Name: Depend briefs   atorvastatin 40 MG tablet Commonly known as: LIPITOR Take 1 tablet (40 mg total) by mouth daily.   bacitracin ointment Apply topically 2 (two) times daily.   divalproex 250 MG 24 hr tablet Commonly known as: DEPAKOTE ER Take 250 mg by mouth daily.   Gemtesa 75 MG Tabs Generic drug: Vibegron Take 1 tablet by mouth daily.   lisinopril 5 MG tablet Commonly known as: ZESTRIL Take 1 tablet (5 mg total) by mouth daily.   multivitamin with minerals Tabs tablet Take 1 tablet by mouth daily.   nitroGLYCERIN 0.4 MG SL tablet Commonly known as: NITROSTAT Place 1 tablet (0.4 mg total) under the tongue every 5 (five) minutes as needed for chest pain.   venlafaxine XR 150 MG 24 hr capsule Commonly known as: EFFEXOR-XR Take 1 capsule (150 mg total) by mouth daily.   warfarin 5 MG tablet Commonly known as: COUMADIN Take 1 tablet (5 mg total) by mouth daily.        Allergies: No Known Allergies  Family History: Family History  Problem Relation Age of Onset   Alzheimer's disease Neg Hx  Parkinson's disease Neg Hx     Social History:  reports that he has never smoked. He has never been exposed to tobacco smoke. He has never used smokeless tobacco. He reports current alcohol use. He reports that he does not use drugs.  ROS: All other review of systems were reviewed and are negative except what is noted above in HPI  Physical Exam: BP 120/71   Pulse 70   Constitutional:  Alert and oriented, No acute distress. HEENT: [place] AT, moist mucus membranes.  Trachea midline, no masses. Cardiovascular: No clubbing, cyanosis, or edema. Respiratory: Normal respiratory effort, no increased work of breathing. GI: Abdomen is soft, nontender, nondistended, no abdominal masses GU: No CVA tenderness.  Lymph: No cervical or  inguinal lymphadenopathy. Skin: No rashes, bruises or suspicious lesions. Neurologic: Grossly intact, no focal deficits, moving all 4 extremities. Psychiatric: Normal mood and affect.  Laboratory Data: Lab Results  Component Value Date   WBC 7.7 04/20/2022   HGB 13.0 04/20/2022   HCT 39.6 04/20/2022   MCV 94.5 04/20/2022   PLT 161 04/20/2022    Lab Results  Component Value Date   CREATININE 0.79 04/20/2022    No results found for: "PSA"  No results found for: "TESTOSTERONE"  Lab Results  Component Value Date   HGBA1C 5.6 03/12/2022    Urinalysis    Component Value Date/Time   COLORURINE AMBER (A) 04/20/2022 1740   APPEARANCEUR CLOUDY (A) 04/20/2022 1740   APPEARANCEUR Cloudy (A) 11/13/2021 1447   LABSPEC 1.017 04/20/2022 1740   PHURINE 6.0 04/20/2022 1740   GLUCOSEU NEGATIVE 04/20/2022 1740   HGBUR LARGE (A) 04/20/2022 1740   BILIRUBINUR NEGATIVE 04/20/2022 1740   BILIRUBINUR Negative 11/13/2021 1447   KETONESUR NEGATIVE 04/20/2022 1740   PROTEINUR 100 (A) 04/20/2022 1740   UROBILINOGEN 1.0 06/07/2012 1313   NITRITE NEGATIVE 04/20/2022 1740   LEUKOCYTESUR MODERATE (A) 04/20/2022 1740    Lab Results  Component Value Date   LABMICR See below: 11/13/2021   WBCUA >30 (A) 11/13/2021   LABEPIT 0-10 11/13/2021   BACTERIA FEW (A) 04/20/2022    Pertinent Imaging:  No results found for this or any previous visit.  No results found for this or any previous visit.  No results found for this or any previous visit.  No results found for this or any previous visit.  Results for orders placed during the hospital encounter of 04/18/21  US RENAL  Narrative CLINICAL DATA:  AK I, chronic Foley catheter.  EXAM: RENAL / URINARY TRACT ULTRASOUND COMPLETE  COMPARISON:  CT April 19, 2021  FINDINGS: Right Kidney:  Renal measurements: 12.1 x 7.7 x 6.6 cm = volume: 324 mL. Echogenicity within normal limits. No mass or hydronephrosis visualized.  Left  Kidney:  Renal measurements: 12.4 x 6.9 x 6.4 cm = volume: 287 mL. Echogenicity within normal limits. No mass or hydronephrosis visualized.  Bladder:  Decompressed around a Foley catheter.  Other:  None.  IMPRESSION: NO hydronephrosis.   Electronically Signed By: Maudry Mayhew M.D. On: 04/20/2021 16:17  No valid procedures specified. No results found for this or any previous visit.  Results for orders placed during the hospital encounter of 04/02/22  CT Renal Stone Study  Narrative CLINICAL DATA:  Abdominal/flank pain, stone suspected worsening lower abd / penis pain, indwelling cath  EXAM: CT ABDOMEN AND PELVIS WITHOUT CONTRAST  TECHNIQUE: Multidetector CT imaging of the abdomen and pelvis was performed following the standard protocol without IV contrast.  RADIATION DOSE REDUCTION: This  exam was performed according to the departmental dose-optimization program which includes automated exposure control, adjustment of the mA and/or kV according to patient size and/or use of iterative reconstruction technique.  COMPARISON:  None Available.  FINDINGS: Lower chest: No acute abnormality.  Hepatobiliary: Cholelithiasis without evidence of acute inflammatory change. Unremarkable appearance of the liver. No evidence of biliary dilation.  Pancreas: Unremarkable. No pancreatic ductal dilatation or surrounding inflammatory changes.  Spleen: Normal in size without focal abnormality.  Adrenals/Urinary Tract: Adrenal glands are unremarkable. Kidneys are normal, without renal calculi, focal lesion, or hydronephrosis. Bladder is decompressed around a Foley catheter. Small contained fluid collection versus diverticulum arising from the bladder is unchanged.  Stomach/Bowel: Unremarkable appearance of the stomach. No dilated bowel loops to suggest obstruction. Diverticulosis without evidence diverticulitis. No findings to suggest appendicitis.  Vascular/Lymphatic:  Aortic atherosclerosis. No enlarged abdominal or pelvic lymph nodes.  Reproductive: Prostate is unremarkable.  Other: Unchanged umbilical hernia which contains fat. No abdominopelvic ascites.  Musculoskeletal: Multilevel degenerative change.  IMPRESSION: 1. No evidence of acute abnormality. 2. Cholelithiasis. 3. Diverticulosis. 4. Small contained fluid collection versus diverticulum arising from the bladder is unchanged.   Electronically Signed By: Feliberto Harts M.D. On: 04/02/2022 13:32   Assessment & Plan:    1. Urge incontinence -we discussed changing to an anticholinergic versus indwelling foley and we have elected to proceed with indwelling foley. - Urinalysis, Routine w reflex microscopic - BLADDER SCAN AMB NON-IMAGING   No follow-ups on file.  Wilkie Aye, MD  [facility] [place]

## 2022-08-09 NOTE — Progress Notes (Unsigned)
post void residual=4  Simple Catheter Placement  Due to urinary incontinence, patient  is present today for a foley cath placement.  Patient was cleaned and prepped in a sterile fashion with betadine. A 18 FR foley catheter was inserted by Dr. Ronne Binning due to difficulty during insertion. MD had to use a wire with cystoscopy. The balloon was filled with 10cc of sterile water.  A leg bag was attached for drainage. Patient was given instruction on proper catheter care.  Patient tolerated well  Performed by: S. Clark CMA/Shomari Scicchitano LPN/Dr. Janeece Agee  Additional notes/ Follow up: Per MD note

## 2022-08-12 ENCOUNTER — Telehealth: Payer: Self-pay

## 2022-08-12 NOTE — Telephone Encounter (Signed)
Received [facility] auth for sleep study. Placed in sleep mailbox

## 2022-08-12 NOTE — Telephone Encounter (Signed)
updated [facility] auth: [id number] (exp. 08/07/22 to 02/06/23)

## 2022-08-13 ENCOUNTER — Ambulatory Visit: Payer: Medicare Other | Admitting: Neurology

## 2022-08-14 NOTE — Addendum Note (Signed)
Addended by: Huston Foley on: 08/14/2022 01:36 PM   Modules accepted: Orders

## 2022-08-14 NOTE — Telephone Encounter (Signed)
The patient has had two fail attempts with the HST. On 08/06/22 & 08/13/22. Do you want to order a in lab??

## 2022-08-14 NOTE — Telephone Encounter (Signed)
PSG ordered.

## 2022-08-20 ENCOUNTER — Encounter: Payer: Self-pay | Admitting: Urology

## 2022-08-20 NOTE — Patient Instructions (Signed)
Indwelling Urinary Catheter Care, Adult An indwelling urinary catheter is a thin, flexible tube that is placed into the bladder to help drain urine out of the body. The catheter is inserted into the urethra. The urethra is the part of the body that drains urine from the bladder. Urine drains from the catheter into a drainage bag outside of the body. Taking good care of your catheter will keep it working properly and help to prevent problems from developing. What are the risks? Bacteria may get into your bladder and cause a urinary tract infection. Urine flow can become blocked. This can happen if the catheter is not working correctly, or if you have sediment or a blood clot in your bladder or catheter. Tissue near the catheter may become irritated and may bleed. How to wear your catheter and your drainage bag Supplies needed Adhesive tape or a leg strap. Alcohol wipe or soap and water (if you use tape). A clean towel (if you use tape). Overnight drainage bag. Smaller drainage bag (leg bag). Wearing your catheter and bag Use adhesive tape or a leg strap to attach your catheter to your leg. Make sure the catheter is not pulled tight. If a leg strap gets wet, replace it with a dry one. If you use adhesive tape: Use an alcohol wipe or soap and water to wash off any stickiness on your skin where you had tape before. Use a clean towel to pat-dry the area. Apply the new tape. You should have received a large overnight drainage bag and a smaller leg bag that fits underneath clothing. You may wear the overnight bag at any time, but you should not wear the leg bag at night. Make sure the overnight drainage bag is always lower than the level of your bladder, but do not let it touch the floor. Before you go to sleep, hang the bag inside a wastebasket that is covered by a clean plastic bag. Secure the leg bag according to manufacturer's instructions. This may be above or below the knee, depending on the  length of the tubing. Make sure that: The leg bag is below the bladder. The tubing does not have loops or too much tension. How to care for the skin around the catheter Supplies needed A clean washcloth. Water and mild soap. A clean towel. Caring for your skin and catheter     Every day, use a clean washcloth and soapy water to clean the skin around your catheter. Wash your hands with soap and water. Wet a washcloth in warm water and mild soap. Clean the skin around your urethra. If you are male: Use one hand to gently spread the folds of skin around your vagina (labia). With the washcloth in your other hand, wipe the inner side of your labia on each side. Do this in a front-to-back direction. If you are male: Use one hand to pull back any skin that covers the end of your penis (foreskin). With the washcloth in your other hand, wipe your penis in small circles. Start wiping at the tip of your penis, then move outward from the catheter. Move the foreskin back in place, if needed. With your free hand, hold the catheter close to where it enters your body. Keep holding the catheter during cleaning so it does not get pulled out. Use your other hand to clean the catheter with the washcloth. Only wipe downward on the catheter, toward the bag. Do not wipe upward toward your body, because that may push  bacteria into your urethra and cause infection. Use a clean towel to pat-dry the catheter and the skin around it. Make sure to wipe off all soap. Wash your hands with soap and water. Shower every day. Do not take baths. Do not use cream, ointment, or lotion on the area where the catheter enters your body, unless your health care provider tells you to do that. Do not use powders, sprays, or lotions on your genital area. Check your skin around the catheter every day for signs of infection. Check for: Redness, swelling, or pain. Fluid or blood. Warmth. Pus or a bad smell. How to empty the  drainage bag Supplies needed Rubbing alcohol. Gauze pad or cotton ball. Adhesive tape or a leg strap. Emptying the bag Empty your drainage bag (your overnight drainage bag or your leg bag) when it is ?- full, or at least 2-3 times a day. Clean the drainage bag according to the manufacturer's instructions or as told by your health care provider. Wash your hands with soap and water. Detach the drainage bag from your leg. Hold the drainage bag over the toilet or a clean container. Make sure the drainage bag is lower than your hips and bladder. This stops urine from going back into the tubing and into your bladder. Open the pour spout at the bottom of the bag. Empty the urine into the toilet or container. Do not let the pour spout touch any surface. This precaution is important to prevent bacteria from getting in the bag and causing infection. Apply rubbing alcohol to a gauze pad or cotton ball. Use the gauze pad or cotton ball to clean the pour spout. Close the pour spout. Attach the bag to your leg with adhesive tape or a leg strap. Wash your hands with soap and water. How to change the drainage bag Supplies needed: Alcohol wipes. A clean drainage bag. Adhesive tape or a leg strap. Changing the bag Replace your drainage bag with a clean bag if it leaks, starts to smell bad, or looks dirty. Wash your hands with soap and water. Detach the dirty drainage bag from your leg. Pinch the catheter with your fingers so that urine does not spill out. Disconnect the catheter tube from the drainage tube at the connection valve. Do not let the tubes touch any surface. Clean the end of the catheter tube with an alcohol wipe. Use a different alcohol wipe to clean the end of the drainage tube. Connect the catheter tube to the drainage tube of the clean bag. Attach the clean bag to your leg with adhesive tape or a leg strap. Avoid attaching the new bag too tightly. Wash your hands with soap and  water. General instructions  Never pull on your catheter or try to remove it. Pulling can damage your internal tissues. Always wash your hands before and after you handle your catheter or drainage bag. Use a mild, fragrance-free soap. If soap and water are not available, use hand sanitizer. Always make sure there are no twists, bends, or kinks in the catheter tube. Always make sure there are no leaks in the catheter or drainage bag. Drink enough fluid to keep your urine pale yellow. Do not take baths, swim, or use a hot tub. If you are male, wipe from front to back after having a bowel movement. Contact a health care provider if: Your catheter gets clogged. Your catheter starts to leak. You have signs of infection at the catheter site, such as: Redness, swelling, or  pain where the catheter enters your body. Fluid, blood, pus, or a bad smell coming from the area where the catheter enters your body. The area where the catheter enters your body feels warm to the touch. You have signs of a urinary tract infection, such as: Fever or chills. Urine smells unusually bad. Cloudy urine. Pain in your abdomen, legs, lower back, or bladder. Nausea or vomiting. Get help right away if: You see blood in the catheter. Your urine is pink or red. Your bladder feels full. Your urine is not draining into the bag. Your catheter gets pulled out. Summary An indwelling urinary catheter is a thin, flexible tube that is placed into the bladder to help drain urine out of the body. The catheter is inserted into the part of the body that drains urine from the bladder (urethra). Take good care of your catheter to keep it working properly and help prevent problems from developing. Always wash your hands before and after you handle your catheter or drainage bag. Never pull on your catheter or try to remove it. This information is not intended to replace advice given to you by your health care provider. Make sure  you discuss any questions you have with your health care provider. Document Revised: 09/14/2020 Document Reviewed: 09/14/2020 Elsevier Patient Education  [place].

## 2022-08-28 ENCOUNTER — Ambulatory Visit: Payer: No Typology Code available for payment source | Admitting: Urology

## 2022-08-29 DIAGNOSIS — Z978 Presence of other specified devices: Secondary | ICD-10-CM | POA: Insufficient documentation

## 2022-08-29 NOTE — Progress Notes (Deleted)
Name: Mario Massey DOB: [birth year] MRN: [id number]  History of Present Illness: Mario Massey is a 77 y.o. male who presents today for follow up visit at [facility]. He is accompanied by his ***daughter Mario Massey, who gave today's history due to patient's dementia.  - GU history: 1.  BPH.  - 04/15/2022: Underwent TURP by Dr. Ronne Binning. 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 Using multiple pads per day. He does not attempt to get up to go to the bathroom when he gets the urge to urinate.   At last visit with Dr. Ronne Binning on 08/09/2022: Per note: "we discussed changing to an anticholinergic versus indwelling foley and we have elected to proceed with indwelling foley."  Today: He reports ***  He reports the catheter is draining ***.  It was last exchanged ***.  He {Actions; denies-reports:120008} gross hematuria.  He {Actions; denies-reports:120008} flank pain. He {Actions; denies-reports:120008} abdominal pain.  He {Actions; denies-reports:120008} fevers. He {Actions; denies-reports:120008} nausea/vomiting.   Fall Screening: Do you usually have a device to assist in your mobility? {yes/no:20286} ***cane / ***walker / ***wheelchair   Medications: Current Outpatient Medications  Medication Sig Dispense Refill   AMBULATORY NON FORMULARY MEDICATION Medication Name: Depend briefs 2 Package 12   atorvastatin (LIPITOR) 40 MG tablet Take 1 tablet (40 mg total) by mouth daily. 90 tablet 3   bacitracin ointment Apply topically 2 (two) times daily. 120 g 0   divalproex (DEPAKOTE ER) 250 MG 24 hr tablet Take 250 mg by mouth daily.     lisinopril (ZESTRIL) 5 MG tablet Take 1 tablet (5 mg total) by mouth daily. 30 tablet 0   Multiple Vitamin (MULTIVITAMIN WITH MINERALS) TABS tablet Take 1 tablet by mouth daily.     nitroGLYCERIN (NITROSTAT) 0.4 MG SL tablet Place 1 tablet (0.4 mg total) under the tongue every 5 (five) minutes as needed for chest pain. 25 tablet 3    venlafaxine XR (EFFEXOR-XR) 150 MG 24 hr capsule Take 1 capsule (150 mg total) by mouth daily. 30 capsule 0   Vibegron (GEMTESA) 75 MG TABS Take 1 tablet by mouth daily.     warfarin (COUMADIN) 5 MG tablet Take 1 tablet (5 mg total) by mouth daily. 30 tablet 3   No current facility-administered medications for this visit.    Allergies: No Known Allergies  Past Medical History:  Diagnosis Date   Ascending aorta dilation (HCC)    Atrial flutter (HCC)    AV dissociation    Bipolar disorder (HCC)    Coronary artery disease    Dementia [facility])    daughter states he asks alot of questions when he is nervous.   Depression    Essential hypertension    Foley catheter in place    Hyperlipidemia LDL goal <70    Hypertension    Myocardial infarction [facility])    Presence of permanent cardiac pacemaker    RBBB    Second degree AV block, Mobitz type I    Past Surgical History:  Procedure Laterality Date   CYSTOSCOPY N/A 04/15/2022   Procedure: CYSTOSCOPY;  Surgeon: Malen Gauze, MD;  Location: [facility];  Service: Urology;  Laterality: N/A;   KNEE SURGERY     with replacement on right knee   LEFT HEART CATH AND CORONARY ANGIOGRAPHY N/A 07/18/2021   Procedure: LEFT HEART CATH AND CORONARY ANGIOGRAPHY;  Surgeon: Tonny Bollman, MD;  Location: [facility] INVASIVE CV LAB;  Service: Cardiovascular;  Laterality: N/A;   PACEMAKER IMPLANT N/A  12/31/2021   Procedure: PACEMAKER IMPLANT;  Surgeon: Marinus Maw, MD;  Location: [facility] INVASIVE CV LAB;  Service: Cardiovascular;  Laterality: N/A;   TRANSURETHRAL RESECTION OF PROSTATE N/A 04/15/2022   Procedure: TRANSURETHRAL RESECTION OF THE PROSTATE (TURP);  Surgeon: Malen Gauze, MD;  Location: [facility];  Service: Urology;  Laterality: N/A;   Family History  Problem Relation Age of Onset   Alzheimer's disease Neg Hx    Parkinson's disease Neg Hx    Social History   Socioeconomic History   Marital status: Widowed    Spouse name: Not on file   Number  of children: Not on file   Years of education: Not on file   Highest education level: Not on file  Occupational History   Not on file  Tobacco Use   Smoking status: Never    Passive exposure: Never   Smokeless tobacco: Never  Vaping Use   Vaping status: Never Used  Substance and Sexual Activity   Alcohol use: Yes    Comment: occasional   Drug use: No   Sexual activity: Not Currently  Other Topics Concern   Not on file  Social History Narrative   Not on file   Social Determinants of Health   Financial Resource Strain: Not on file  Food Insecurity: No Food Insecurity (04/15/2022)   Hunger Vital Sign    Worried About Running Out of Food in the Last Year: Never true    Ran Out of Food in the Last Year: Never true  Transportation Needs: No Transportation Needs (04/15/2022)   PRAPARE - Administrator, Civil Service (Medical): No    Lack of Transportation (Non-Medical): No  Physical Activity: Not on file  Stress: Not on file  Social Connections: Not on file  Intimate Partner Violence: Not At Risk (04/15/2022)   Humiliation, Afraid, Rape, and Kick questionnaire    Fear of Current or Ex-Partner: No    Emotionally Abused: No    Physically Abused: No    Sexually Abused: No    Review of Systems Constitutional: Patient ***denies any unintentional weight loss or change in strength lntegumentary: Patient ***denies any rashes or pruritus Eyes: Patient denies ***dry eyes ENT: Patient ***denies dry mouth Cardiovascular: Patient ***denies chest pain or syncope Respiratory: Patient ***denies shortness of breath Gastrointestinal: Patient ***denies nausea, vomiting, constipation, or diarrhea Musculoskeletal: Patient ***denies muscle cramps or weakness Neurologic: Patient ***denies convulsions or seizures Psychiatric: Patient ***denies memory problems Allergic/Immunologic: Patient ***denies recent allergic reaction(s) Hematologic/Lymphatic: Patient denies bleeding  tendencies Endocrine: Patient ***denies heat/cold intolerance  GU: As per HPI.  OBJECTIVE There were no vitals filed for this visit. There is no height or weight on file to calculate BMI.  Physical Examination  Constitutional: ***No obvious distress; patient is ***non-toxic appearing  Cardiovascular: ***No visible lower extremity edema.  Respiratory: The patient does ***not have audible wheezing/stridor; respirations do ***not appear labored  Gastrointestinal: Abdomen ***non-distended Musculoskeletal: ***Normal ROM of UEs  Skin: ***No obvious rashes/open sores  Neurologic: CN 2-12 grossly ***intact Psychiatric: Answered questions ***appropriately with ***normal affect  Hematologic/Lymphatic/Immunologic: ***No obvious bruises or sites of spontaneous bleeding  UA: {Desc; negative/positive:13464} for *** WBC/hpf, *** RBC/hpf, bacteria (***) PVR: *** ml  ASSESSMENT No diagnosis found. ***  Will plan for follow up in *** months / ***1 year or sooner if needed. Pt verbalized understanding and agreement. All questions were answered.  PLAN Advised the following: 1. *** 2. ***No follow-ups on file.  No orders of the defined types  were placed in this encounter.   It has been explained that the patient is to follow regularly with their PCP in addition to all other providers involved in their care and to follow instructions provided by these respective offices. Patient advised to contact urology clinic if any urologic-pertaining questions, concerns, new symptoms or problems arise in the interim period.  There are no Patient Instructions on file for this visit.  Electronically signed by:  Donnita Falls, FNP   08/29/22    2:42 PM

## 2022-09-03 ENCOUNTER — Ambulatory Visit (INDEPENDENT_AMBULATORY_CARE_PROVIDER_SITE_OTHER): Payer: No Typology Code available for payment source

## 2022-09-03 ENCOUNTER — Ambulatory Visit: Payer: No Typology Code available for payment source | Admitting: Urology

## 2022-09-03 DIAGNOSIS — G20A1 Parkinson's disease without dyskinesia, without mention of fluctuations: Secondary | ICD-10-CM

## 2022-09-03 DIAGNOSIS — N3281 Overactive bladder: Secondary | ICD-10-CM

## 2022-09-03 DIAGNOSIS — Z978 Presence of other specified devices: Secondary | ICD-10-CM

## 2022-09-03 DIAGNOSIS — R3915 Urgency of urination: Secondary | ICD-10-CM

## 2022-09-03 DIAGNOSIS — R339 Retention of urine, unspecified: Secondary | ICD-10-CM | POA: Diagnosis not present

## 2022-09-03 DIAGNOSIS — N400 Enlarged prostate without lower urinary tract symptoms: Secondary | ICD-10-CM

## 2022-09-03 DIAGNOSIS — N3941 Urge incontinence: Secondary | ICD-10-CM

## 2022-09-03 NOTE — Progress Notes (Signed)
Cath Change/ Replacement  Patient is present today for a catheter change due to urinary retention.  10ml of water was removed from the balloon, a 18FR foley cath was removed without difficulty.  Patient was cleaned and prepped in a sterile fashion with betadine and 2% lidocaine jelly was instilled into the urethra. A 18 FR coude foley cath was replaced into the bladder, no complications were noted. Urine return was noted 30ml and urine was yellow in color. The balloon was filled with 10ml of sterile water. A leg bag was attached for drainage. Patient was given proper instruction on catheter care.    Performed by: Guss Bunde, CMA  Follow up: Follow up as scheduled.

## 2022-09-05 ENCOUNTER — Encounter: Payer: Self-pay | Admitting: Neurology

## 2022-09-05 ENCOUNTER — Ambulatory Visit: Payer: No Typology Code available for payment source | Admitting: Neurology

## 2022-09-05 ENCOUNTER — Telehealth: Payer: Self-pay | Admitting: Neurology

## 2022-09-05 VITALS — BP 123/77 | HR 69 | Ht 72.0 in | Wt 270.0 lb

## 2022-09-05 DIAGNOSIS — F01518 Vascular dementia, unspecified severity, with other behavioral disturbance: Secondary | ICD-10-CM | POA: Diagnosis not present

## 2022-09-05 DIAGNOSIS — Z9189 Other specified personal risk factors, not elsewhere classified: Secondary | ICD-10-CM

## 2022-09-05 MED ORDER — DONEPEZIL HCL 10 MG PO TABS
10.0000 mg | ORAL_TABLET | Freq: Every day | ORAL | 3 refills | Status: DC
Start: 2022-09-05 — End: 2022-09-17
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
## 2022-09-05 NOTE — Patient Instructions (Addendum)
It was nice to see you both today.   We will start for your memory loss: Aricept (generic name: donepezil) 10 mg: take 1/2 pill each evening for the first month, then 1 pill daily thereafter. Common side effects may include dry eyes, dry mouth, confusion, low pulse, low blood pressure, also GI related side effects (nausea, vomiting, diarrhea, constipation), headaches; rare side effects may include hallucinations and seizures.   Please keep your appointment for the sleep study.   Please try to hydrate better with water, drink about 6 cups of water per day, 8 oz size each.   We will do a CT scan of the head.

## 2022-09-05 NOTE — Telephone Encounter (Signed)
[facility] Berkley Harvey: [id number] (exp. 08/07/22 to 02/06/23) sent to GI [phone]

## 2022-09-10 ENCOUNTER — Other Ambulatory Visit: Payer: Self-pay | Admitting: Internal Medicine

## 2022-09-10 DIAGNOSIS — I4819 Other persistent atrial fibrillation: Secondary | ICD-10-CM

## 2022-09-17 ENCOUNTER — Encounter: Payer: Self-pay | Admitting: Neurology

## 2022-09-17 ENCOUNTER — Other Ambulatory Visit: Payer: Self-pay | Admitting: Internal Medicine

## 2022-09-17 DIAGNOSIS — I4819 Other persistent atrial fibrillation: Secondary | ICD-10-CM

## 2022-09-17 NOTE — Addendum Note (Signed)
Addended by: Bertram Savin on: 09/17/2022 11:58 AM   Modules accepted: Orders

## 2022-09-17 NOTE — Telephone Encounter (Signed)
Donepezil d/c from med list.

## 2022-09-17 NOTE — Telephone Encounter (Signed)
See MyChart reply.  Please call patient's daughter back with my recommendations if she has not responded to the message by today.

## 2022-09-17 NOTE — Telephone Encounter (Signed)
Pt has not been seen by out Anticoagulation Clinics. Warfarin sent 03/26/22 30 tablets and 3 refills by another user  Will need to call to see who has been monitoring or have pt set up for one of our Clinics

## 2022-09-18 ENCOUNTER — Ambulatory Visit
Admission: RE | Admit: 2022-09-18 | Discharge: 2022-09-18 | Disposition: A | Discharge status: Home / Self Care | Payer: No Typology Code available for payment source | Source: Ambulatory Visit | Attending: Neurology | Admitting: Neurology

## 2022-09-18 DIAGNOSIS — F01518 Vascular dementia, unspecified severity, with other behavioral disturbance: Secondary | ICD-10-CM

## 2022-09-18 NOTE — Telephone Encounter (Signed)
Called daughter.  No answer.  Left message for her to call me back about who is managing pt's warfarin.  Will await return call.  Number provided.

## 2022-09-19 ENCOUNTER — Ambulatory Visit (INDEPENDENT_AMBULATORY_CARE_PROVIDER_SITE_OTHER): Payer: Self-pay

## 2022-09-19 DIAGNOSIS — R339 Retention of urine, unspecified: Secondary | ICD-10-CM

## 2022-09-19 NOTE — Progress Notes (Addendum)
Patient present in office complaining clogged catheter. Unable to flush catheter.  Cath Change/ Replacement  Patient is present today for a catheter change due to urinary retention.  10ml of water was removed from the balloon, a 18FR coude foley cath was removed without difficulty.  Patient was cleaned and prepped in a sterile fashion with betadine and 2% lidocaine jelly was instilled into the urethra. A 20 FR coude foley cath was replaced into the bladder, no complications were noted. Urine return was noted and urine was yellow in color. The balloon was filled with 10ml of sterile water. A leg bag was attached for drainage.  Patient was given proper instruction on catheter care.    Performed by: Sofi Bryars LPN  Follow up: keep scheduled NV/ Per daughter patient is starting antibiotic today from PCP for UTI.

## 2022-09-19 NOTE — Telephone Encounter (Signed)
Called daughter again.  No answer.  Refill refused with note to Pharmacy to sent to PCP to see if he is handling warfarin management.

## 2022-09-19 NOTE — Progress Notes (Signed)
Open in error

## 2022-09-19 NOTE — Patient Instructions (Signed)

## 2022-09-25 ENCOUNTER — Telehealth: Payer: Self-pay

## 2022-09-25 NOTE — Telephone Encounter (Signed)
I spoke with the patient's daughter, per DPR. I read the results of the CT from Dr. Frances Furbish. She verbalized understanding of the findings and expressed appreciation for the call. All questions answered.  She would like to clarify that the patient has an appointment on Labor Day, September 2nd for the sleep study.

## 2022-09-25 NOTE — Telephone Encounter (Signed)
-----   Message from Huston Foley sent at 09/23/2022  4:26 PM EDT ----- Please call patient or his family member and DPR about his head CT.  His recent head CT without contrast from 09/18/2022 showed no acute findings, chronic changes were seen including volume loss which is in the mild to moderate range and slightly progressed compared to 2022.  Chronic hardening of the arteries which we called chronic microvascular changes were seen in the mild range.  He can follow-up as scheduled or sooner if the need arises.

## 2022-09-26 NOTE — Telephone Encounter (Signed)
She has been reached out to and confirmed sleep study appointment.

## 2022-09-30 ENCOUNTER — Ambulatory Visit (INDEPENDENT_AMBULATORY_CARE_PROVIDER_SITE_OTHER): Payer: No Typology Code available for payment source | Admitting: Neurology

## 2022-09-30 DIAGNOSIS — G2119 Other drug induced secondary parkinsonism: Secondary | ICD-10-CM

## 2022-09-30 DIAGNOSIS — G4733 Obstructive sleep apnea (adult) (pediatric): Secondary | ICD-10-CM

## 2022-09-30 DIAGNOSIS — G4761 Periodic limb movement disorder: Secondary | ICD-10-CM

## 2022-09-30 DIAGNOSIS — G4720 Circadian rhythm sleep disorder, unspecified type: Secondary | ICD-10-CM

## 2022-09-30 DIAGNOSIS — R9431 Abnormal electrocardiogram [ECG] [EKG]: Secondary | ICD-10-CM

## 2022-09-30 DIAGNOSIS — Z789 Other specified health status: Secondary | ICD-10-CM

## 2022-09-30 DIAGNOSIS — G4719 Other hypersomnia: Secondary | ICD-10-CM

## 2022-09-30 DIAGNOSIS — G478 Other sleep disorders: Secondary | ICD-10-CM

## 2022-09-30 DIAGNOSIS — R413 Other amnesia: Secondary | ICD-10-CM

## 2022-09-30 DIAGNOSIS — Z9189 Other specified personal risk factors, not elsewhere classified: Secondary | ICD-10-CM

## 2022-10-01 ENCOUNTER — Ambulatory Visit (INDEPENDENT_AMBULATORY_CARE_PROVIDER_SITE_OTHER): Payer: No Typology Code available for payment source

## 2022-10-01 ENCOUNTER — Ambulatory Visit: Payer: No Typology Code available for payment source | Admitting: Urology

## 2022-10-01 DIAGNOSIS — I442 Atrioventricular block, complete: Secondary | ICD-10-CM | POA: Diagnosis not present

## 2022-10-01 LAB — CUP PACEART REMOTE DEVICE CHECK
Battery Remaining Longevity: 94 mo
Battery Remaining Percentage: 92 %
Battery Voltage: 3.02 V
Brady Statistic AP VP Percent: 0 %
Brady Statistic AP VS Percent: 0 %
Brady Statistic AS VP Percent: 0 %
Brady Statistic AS VS Percent: 0 %
Brady Statistic RA Percent Paced: 0 %
Brady Statistic RV Percent Paced: 97 %
Date Time Interrogation Session: 20240903100928
Implantable Lead Connection Status: 753985
Implantable Lead Connection Status: 753985
Implantable Lead Implant Date: 20231204
Implantable Lead Implant Date: 20231204
Implantable Lead Location: 753859
Implantable Lead Location: 753860
Implantable Pulse Generator Implant Date: 20231204
Lead Channel Impedance Value: 400 Ohm
Lead Channel Impedance Value: 440 Ohm
Lead Channel Pacing Threshold Amplitude: 0.75 V
Lead Channel Pacing Threshold Amplitude: 0.75 V
Lead Channel Pacing Threshold Pulse Width: 0.5 ms
Lead Channel Pacing Threshold Pulse Width: 0.5 ms
Lead Channel Sensing Intrinsic Amplitude: 2 mV
Lead Channel Sensing Intrinsic Amplitude: 9.9 mV
Lead Channel Setting Pacing Amplitude: 2 V
Lead Channel Setting Pacing Amplitude: 2.5 V
Lead Channel Setting Pacing Pulse Width: 0.5 ms
Lead Channel Setting Sensing Sensitivity: 0.5 mV
Pulse Gen Model: 2272
Pulse Gen Serial Number: 8111795

## 2022-10-03 NOTE — Procedures (Signed)
Physician Interpretation:     [facility] POLYSOMNOGRAPHY  INTERPRETATION REPORT   STUDY DATE:  09/30/2022     PATIENT NAME:  Mario Massey         DATE OF BIRTH:  12-24-1945  PATIENT ID:  [id number]    TYPE OF STUDY:  PSG  READING PHYSICIAN: Huston Foley, MD, PhD   SCORING TECHNICIAN: Domingo Cocking, RPSGT   Referred by: Dr. Avon Gully ? 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 His ESS is 19/24. Height: 72 in Weight: 270 lb (BMI 36).   MEDICATIONS: Lipitor, Depakote ER, Zestril, Multivitaamin w/minerals, Nitrostat, Effexor XR, Gemtesa, Coumadin   TECHNICAL DESCRIPTION: A registered sleep technologist was in attendance for the duration of the recording.  Data collection, scoring, video monitoring, and reporting were performed in compliance with the AASM Manual for the Scoring of Sleep and Associated Events; (Hypopnea is scored based on the criteria listed in Section VIII D. 1b in the AASM Manual V2.6 using a 4% oxygen desaturation rule or Hypopnea is scored based on the criteria listed in Section VIII D. 1a in the AASM Manual V2.6 using 3% oxygen desaturation and /or arousal rule).   SLEEP CONTINUITY AND SLEEP ARCHITECTURE:  Lights-out was at 21:26: and lights-on at  05:02:, with a total recording time of 7 hours, 35.5 min. Total sleep time ( TST) was 224.0 minutes with a significantly decreased sleep efficiency at 49.2%. There was  14.7% REM sleep.   BODY  POSITION:  TST was divided  between the following sleep positions: 100.0% supine;  0.0% lateral;  0% prone. Duration of total sleep and percent of total sleep in their respective position is as follows: supine 224 minutes (100%), non-supine 0 minutes (0%); right 00 minutes (0%), left 00 minutes (0%), and prone 00 minutes (0%).  Total supine REM sleep time was 33 minutes (100% of total REM sleep).  Sleep latency was delayed at 60.5 minutes.  REM sleep latency was increased at 143.5 minutes. Of the total sleep time, the percentage of stage N1 sleep was 29.0%, which is markedly increased, stage N2 sleep was 56%, which is mildly increased, stage N3 sleep was absent, and REM sleep was 14.7%. Wake after sleep onset (WASO) time accounted for 171 minutes with severe sleep fragmentation noted.   RESPIRATORY MONITORING:   Based on CMS criteria (using a 4% oxygen desaturation rule for scoring hypopneas), there were 119 apneas (116 obstructive; 3 central; 0 mixed), and 37 hypopneas.  Apnea index was 31.9. Hypopnea index was 9.9. The apnea-hypopnea index was 41.8/hour overall (41.8 supine, 0 non-supine; 61.8 REM, 61.8 supine REM).  There were 0 respiratory effort-related arousals (RERAs).  The RERA index was 0 events/h. Total respiratory disturbance index (RDI) was 41.8 events/h. RDI results showed: supine RDI  41.8 /h; non-supine RDI 0.0 /h; REM RDI 61.8 /h, supine REM RDI 61.8 /h.   Based on AASM criteria (using a 3% oxygen desaturation and /or arousal rule for scoring hypopneas), there were 119 apneas (116 obstructive; 3 central; 0 mixed), and 62 hypopneas. Apnea index was 31.9. Hypopnea index was 16.6. The apnea-hypopnea  index was 48.5 overall (48.5 supine, 0 non-supine; 67.3 REM, 67.3 supine REM).  There were 0 respiratory effort-related arousals (RERAs).  The RERA index was 0 events/h. Total respiratory disturbance index (RDI) was 48.5 events/h. RDI results showed: supine RDI  48.5 /h; non-supine RDI 0.0 /h; REM  RDI 67.3 /h, supine REM RDI 67.3 /h.    OXIMETRY: Oxyhemoglobin Saturation Nadir during sleep was at  81% from a mean of 96%.  Of the Total sleep time (TST)   hypoxemia (=<88%) was present for  5.2 minutes, or 2.3% of total sleep time.    LIMB MOVEMENTS: There were 89 periodic limb movements of sleep (23.8/hr), of which 11 (2.9/hr) were associated with an arousal.   AROUSAL: There were 171 arousals in total, for an arousal index of 46 arousals/hour.  Of these, 115 were identified as respiratory-related arousals (31 /h), 11 were PLM-related arousals (3 /h), and 79 were non-specific arousals (21 /h).   EEG: Review of the EEG showed no abnormal electrical discharges and symmetrical bihemispheric findings.    EKG: The EKG revealed normal sinus rhythm (NSR) with occasional PVCs noted. The average heart rate during sleep was 69 bpm.   AUDIO/VIDEO REVIEW: The audio and video review did not show any abnormal or unusual behaviors, movements, phonations or vocalizations, with the exception of unintelligible mumbling upon arousal at times. The patient took no restroom breaks, he has a catheter in place. Snoring was noted, in the mild range.  POST-STUDY QUESTIONNAIRE: Post study, the patient indicated, that sleep was worse than usual.   IMPRESSION:  1. Severe Obstructive Sleep Apnea (OSA) 2. 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 Dysfunctions associated with sleep stages or arousal from sleep 4. Non-specific abnormal electrocardiogram (EKG) 5. Poor sleep pattern  RECOMMENDATIONS:  1. This study demonstrates severe obstructive sleep apnea, with a total AHI of 41.8/hour, REM AHI of 61.8/hour, supine AHI of 41.8/hour and O2 nadir of 81% (during supine REM sleep). Treatment with a positive airway pressure (PAP) device is recommended.  The patient will be advised to proceed with home autoPAP therapy for now.  A laboratory attended, full night PAP-titration study can be considered down the road,  to optimize therapy settings, mask fit, monitoring of tolerance and of proper oxygen saturations. Other treatment options may be limited, and may include (generally speaking) surgical options in selected patients. Concomitant weight loss is recommended.  2. Please note that untreated obstructive sleep apnea may carry additional perioperative morbidity. Patients with significant obstructive sleep apnea should receive perioperative PAP therapy and the surgeons and particularly the anesthesiologist should be informed of the diagnosis and the severity of the sleep disordered breathing. 3. Mild PLMs (periodic limb movements of sleep) were noted during this study with no significant associated arousals; clinical correlation is recommended. Medication effect from the antidepressant medication should be considered. Of note, PLMs may improve with PAP therapy. 4. This study shows significant sleep fragmentation and abnormal sleep stage percentages; these are nonspecific findings and per se do not signify an intrinsic sleep disorder or a cause for the patient's sleep-related symptoms. Causes include (but are not limited to) the first night effect of the sleep study, circadian rhythm disturbances, medication effect or an underlying mood disorder or medical problem.  5. The study showed occasional PVCs (premature ventricular complexes) on single lead EKG; clinical correlation is recommended. The patient is well-known to cardiology. 6. The patient should be cautioned not to drive, work at heights, or operate dangerous or heavy equipment  when tired or sleepy. Review and reiteration of good sleep hygiene measures should be pursued with any patient. 7. The patient will be seen in follow-up in the sleep clinic at [facility] for discussion of the test results, symptom and treatment compliance review, further management strategies, etc. The patient and the referring provider will be notified of the test results. I certify that I have  reviewed the entire raw data recording prior to the issuance of this report in accordance with the Standards of Accreditation of the American Academy of Sleep Medicine (AASM).  Huston Foley, MD, PhD Medical Director, [facility]) Diplomat, ABPN (Neurology and Sleep)               Technical Report:   General Information  Name: Mario Massey, Mario Massey BMI: 95.18 Physician: Huston Foley, MD  ID: [id number] Height: 72.0 in Technician: Domingo Cocking, RPSGT  Sex: Male Weight: 270.0 lb Record: [id number]  Age: 88 [[birth year]] Date: 09/30/2022    Medical & Medication History    Mr. 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 The patient is accompanied by his daughter today. I first met him at the request of his primary care physician on 03/12/2022, at which time he reported forgetfulness. His MMSE was 22 at the time. I felt he was mostly at risk for vascular dementia. He was not on treatment for his sleep apnea and was encouraged to pursue reevaluation. 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 I suggested we proceed with a head CT. He declined blood work initially. He was later agreeable to pursuing a home sleep test. Laboratory testing showed normal vitamin B1, A1c 5.6, ammonia 61, vitamin B6 normal, TSH slightly above normal at 4.54, RPR nonreactive, CMP fairly benign. The daughter was notified of the test results.  He did not want to proceed with a head CT. He has not had a sleep study yet. His daughter will send a message in the interim in early July reporting worsening confusion and worsening memory loss. Today, 09/05/2022: He reports not very much of his history, he is forgetful and denies any specific concerns, daughter provides his history and reports further worsening of his short-term memory. He is no longer on Geodon, this was changed somewhere in March or April 2024, he has been on Depakote ER, currently 500 mg daily per daughter. He has not had any recent falls, he moved onto his daughter's property in June, they had a rental home on the property and he has settled in quite well. He has not driven a car in about a year.  Lipitor, Depakote ER, Zestril, Multivitaamin w/minerals, Nitrostat, Effexor XR, Gemtesa, Coumadin   Sleep Disorder      Comments   Patient arrived for a diagnostic polysomnogram. Procedure explained and all questions answered. Standard paste setup without complications. Patient slept supine. Sleep was somewhat fragmented with frequent arousal, somniloquy, PLMS, and general fussiness with verbal complaints upon some arousals. Occasional mild snoring was heard. Respiratory events observed. After 2 hours total sleep time, AHI = 31. Cardiac arrhythmias  noted. Patient has a known cardiac history. Patient has a pacemaker. Patient arrived with catheter in place strapped to his right thigh. PLMS observed.    Lights out: 09:26:20 PM Lights on: 05:02:05 AM   Time Total Supine Side Prone Upright  Recording (TRT) 7h 35.63m 7h 35.39m 0h 0.48m 0h 0.26m 0h 0.68m  Sleep (TST) 3h 44.16m 3h 44.49m 0h 0.65m 0h 0.36m 0h 0.67m   Latency N1 N2 N3 REM Onset Per. Slp. Eff.  Actual 0h 0.46m 0h 3.42m 0h 0.68m 2h 23.33m 1h 0.52m 1h 42.40m 49.18%   Stg Dur Wake N1 N2 N3 REM  Total 231.5 65.0 126.0 0.0 33.0  Supine 231.5 65.0 126.0 0.0 33.0  Side 0.0 0.0 0.0 0.0 0.0  Prone 0.0 0.0 0.0 0.0 0.0  Upright 0.0 0.0 0.0 0.0 0.0    Stg % Wake N1 N2 N3 REM  Total 50.8 29.0 56.3 0.0 14.7  Supine 50.8 29.0 56.3 0.0 14.7  Side 0.0 0.0 0.0 0.0 0.0  Prone 0.0 0.0 0.0 0.0 0.0  Upright 0.0 0.0 0.0 0.0 0.0     Apnea Summary Sub Supine Side Prone Upright  Total 119 Total 119 119 0 0 0    REM 25 25 0 0 0    NREM 94 94 0 0 0  Obs 116 REM 25 25 0 0 0    NREM 91 91 0 0 0  Mix 0 REM 0 0 0 0 0    NREM 0 0 0 0 0  Cen 3 REM 0 0 0 0 0    NREM 3 3 0 0 0   Rera Summary Sub Supine Side Prone Upright  Total 0 Total 0 0 0 0 0    REM 0 0 0 0 0    NREM 0 0 0 0 0   Hypopnea Summary Sub Supine Side Prone Upright  Total 62 Total 62 62 0 0 0    REM 12 12 0 0 0    NREM 50 50 0 0 0   4% Hypopnea Summary Sub Supine Side Prone Upright  Total (4%) 37 Total 37 37 0 0 0    REM 9 9 0 0 0    NREM 28 28 0 0 0     AHI Total Obs Mix Cen  48.48 Apnea 31.88 31.07 0.00 0.80   Hypopnea 16.61 -- -- --  41.79 Hypopnea (4%) 9.91 -- -- --    Total Supine Side Prone Upright  Position AHI 48.48 48.48 0.00 0.00 0.00  REM AHI 67.27   NREM AHI 45.24   Position RDI 48.48 48.48 0.00 0.00 0.00  REM RDI 67.27   NREM RDI 45.24    4% Hypopnea Total Supine Side Prone Upright  Position AHI (4%) 41.79 41.79 0.00 0.00 0.00  REM AHI (4%) 61.82   NREM AHI (4%) 38.32   Position RDI (4%) 41.79 41.79 0.00 0.00 0.00  REM RDI (4%) 61.82   NREM RDI (4%) 38.32    Desaturation Information Threshold: 2% <100% <90% <80% <70% <60% <50% <40%  Supine 341.0 49.0 1.0 0.0 0.0 0.0 0.0  Side 0.0 0.0 0.0 0.0 0.0 0.0 0.0  Prone 0.0 0.0 0.0 0.0 0.0 0.0 0.0  Upright 0.0 0.0 0.0 0.0 0.0 0.0 0.0  Total 341.0 49.0 1.0 0.0 0.0 0.0 0.0  Index 51.9 7.5 0.2 0.0 0.0 0.0 0.0   Threshold: 3% <100% <90% <80% <70% <60% <50% <40%  Supine 231.0 49.0 1.0 0.0 0.0 0.0 0.0  Side 0.0 0.0 0.0 0.0 0.0 0.0 0.0  Prone 0.0 0.0 0.0 0.0 0.0 0.0 0.0  Upright 0.0 0.0 0.0 0.0 0.0 0.0 0.0  Total 231.0 49.0 1.0 0.0 0.0 0.0 0.0  Index 35.1 7.5 0.2 0.0 0.0 0.0 0.0   Threshold: 4% <100% <90%  <80% <70% <60% <50% <40%  Supine 156.0 49.0 1.0 0.0 0.0 0.0 0.0  Side 0.0 0.0 0.0 0.0 0.0 0.0 0.0  Prone 0.0 0.0 0.0 0.0 0.0 0.0 0.0  Upright 0.0 0.0 0.0 0.0 0.0 0.0 0.0  Total 156.0 49.0 1.0 0.0 0.0 0.0 0.0  Index 23.7 7.5 0.2 0.0 0.0 0.0 0.0   Threshold: 3% <100% <90% <80% <70% <60% <50% <40%  Supine 231 49 1 0 0 0 0  Side 0 0 0 0 0 0 0  Prone 0 0 0 0 0 0 0  Upright 0 0 0 0 0 0 0  Total 231 49 1 0 0 0 0   Awakening/Arousal Information # of Awakenings 60  Wake after sleep onset 171.84m  Wake after persistent sleep 152.75m   Arousal Assoc. Arousals Index  Apneas 76 20.4  Hypopneas 39 10.4  Leg Movements 13 3.5  Snore 0 0.0  PTT Arousals 0 0.0  Spontaneous 79 21.2  Total 205 54.9  Leg Movement Information PLMS LMs Index  Total LMs during PLMS 89 23.8  LMs w/ Microarousals 11 2.9   LM LMs Index  w/ Microarousal 1 0.3  w/ Awakening 1 0.3  w/ Resp Event 1 0.3  Spontaneous 9 2.4  Total 11 2.9     Desaturation threshold setting: 3% Minimum desaturation setting: 10 seconds SaO2 nadir: 79% The longest event was a 44 sec obstructive Apnea with a minimum SaO2 of 79%. The lowest SaO2 was 79% associated with a 44 sec obstructive Apnea. EKG Rates EKG Avg Max Min  Awake 69 85 67  Asleep 69 81 67  EKG Events: N/A

## 2022-10-03 NOTE — Addendum Note (Signed)
Addended by: Huston Foley on: 10/03/2022 05:57 PM   Modules accepted: Orders

## 2022-10-07 ENCOUNTER — Telehealth: Payer: Self-pay | Admitting: *Deleted

## 2022-10-07 NOTE — Telephone Encounter (Signed)
-----   Message from Huston Foley sent at 10/03/2022  5:57 PM EDT ----- Patient referred for memory loss, he had a PSG on 09/30/22.    Please call and notify the patient's daughter that the recent sleep study showed evidence of severe obstructive sleep apnea. He did not sleep very well, but had clear signs of sleep apnea and I recommend treatment for in the form of autoPAP. We may consider at a CPAP titration study at a later date, if need be, which means, that we would ask him to come back in for a second sleep study with CPAP treatment. For now, I would like to start him on a so-called autoPAP machine at home, through a DME company (of his choice, or as per insurance requirement). The DME representative will educate him on how to use the machine, how to put the mask on, etc. I have placed an order in the chart. Please send referral, talk to patient, send report to referring MD. We will need a FU in sleep clinic for 10 weeks post-PAP set up, please arrange that with me or one of our NPs. Thanks,   Huston Foley, MD, PhD [facility])

## 2022-10-07 NOTE — Telephone Encounter (Signed)
Called pt's daughter Farrel Gordon (on [place]) and LVM (ok per DPR) advising pt's SS showed severe sleep apnea and Dr Frances Furbish recommends setting pt up on autopap asap. I asked for call back to discuss. Left office hours and number in message.

## 2022-10-07 NOTE — Telephone Encounter (Addendum)
Daughter returned call, informed her  Dr. Frances Furbish reviewed their sleep study results and found that pt has sever OSA. Dr. Frances Furbish recommends that pt tarts autopap. I reviewed PAP compliance expectations with the pt daughter. She is agreeable to starting a CPAP. I advised pt that an order will be sent to [facility] and the [place] will call the pt within about one week after they file with the pt's insurance. [facility] will show the pt how to use the machine, fit for masks, and troubleshoot the CPAP if needed. A follow up appt was made for insurance purposes with Amy 12/30/22. Pt daughter verbalized understanding to arrive 15 minutes early and bring their CPAP. Pt daughter verbalized understanding of results. She had no questions at this time but was encouraged to call back if questions arise. I have sent the order to [facility]  and have received confirmation that they have received the order.

## 2022-10-07 NOTE — Telephone Encounter (Signed)
REQUIRED PHONE NOTE: the daughter has called back to discuss the sleep study results.

## 2022-10-09 NOTE — Progress Notes (Signed)
Remote pacemaker transmission.   

## 2022-10-15 ENCOUNTER — Other Ambulatory Visit: Payer: Self-pay | Admitting: Physician Assistant

## 2022-10-15 DIAGNOSIS — Z978 Presence of other specified devices: Secondary | ICD-10-CM | POA: Insufficient documentation

## 2022-10-15 NOTE — Progress Notes (Unsigned)
Name: Mario Massey DOB: 1945-07-13 MRN: [id number]  History of Present Illness: [name] is a 77 y.o. male who presents today for follow up visit at [facility]. He is accompanied by his daughter Lilyan Punt, who gave today's history due to patient's dementia / memory loss.  - GU history: 1.  BPH.  - 04/15/2022: Underwent TURP by Dr. Ronne Binning. 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 Using multiple pads per day. He does not attempt to get up to go to the bathroom when he gets the urge to urinate.  3. Erectile dysfunction.  At last visit with Dr. Ronne Binning on 08/09/2022: Per note: "we discussed changing to an anticholinergic versus indwelling foley and we have elected to proceed with indwelling foley."  Since last visit: - 09/30/2022: Diagnosed with severe OSA and periodic limb movement disorder of sleep per sleep study evaluation. CPAP use advised.   Today: He reports doing well. He reports the catheter is draining clear yellow urine. It was last exchanged 09/19/2022 at urology nurse visit. He denies gross hematuria. He denies flank pain, abdominal pain, fevers.   Fall Screening: Do you usually have a device to assist in your mobility? Yes   Medications: Current Outpatient Medications  Medication Sig Dispense Refill   AMBULATORY NON FORMULARY MEDICATION Medication Name: Depend briefs 2 Package 12   atorvastatin (LIPITOR) 40 MG tablet Take 1 tablet (40 mg total) by mouth daily. Please call [phone] to schedule an appointment with Dr. Zoila Shutter for future refills. Thank you. 30 tablet 0   bacitracin ointment Apply topically 2 (two) times daily. 120 g 0   divalproex (DEPAKOTE ER) 250 MG 24 hr tablet Take 250 mg by mouth daily.     lisinopril (ZESTRIL) 5 MG tablet Take 1 tablet (5 mg total) by mouth daily. 30 tablet 0   Multiple Vitamin (MULTIVITAMIN WITH MINERALS) TABS tablet Take 1 tablet by mouth daily.     nitroGLYCERIN (NITROSTAT) 0.4 MG SL tablet  Place 1 tablet (0.4 mg total) under the tongue every 5 (five) minutes as needed for chest pain. 25 tablet 3   venlafaxine XR (EFFEXOR-XR) 150 MG 24 hr capsule Take 1 capsule (150 mg total) by mouth daily. 30 capsule 0   warfarin (COUMADIN) 5 MG tablet Take 1 tablet (5 mg total) by mouth daily. 30 tablet 3   Vibegron (GEMTESA) 75 MG TABS Take 1 tablet by mouth daily. (Patient not taking: Reported on 10/17/2022)     No current facility-administered medications for this visit.    Allergies: No Known Allergies  Past Medical History:  Diagnosis Date   Ascending aorta dilation (HCC)    Atrial flutter (HCC)    AV dissociation    Bipolar disorder (HCC)    Coronary artery disease    Dementia [facility])    daughter states he asks alot of questions when he is nervous.   Depression    Essential hypertension    Foley catheter in place    replaced July 2024   Hyperlipidemia LDL goal <70    Hypertension    Myocardial infarction [facility])    Presence of permanent cardiac pacemaker    RBBB    Second degree AV block, Mobitz type I    Past Surgical History:  Procedure Laterality Date   CYSTOSCOPY N/A 04/15/2022   Procedure: CYSTOSCOPY;  Surgeon: Malen Gauze, MD;  Location: [facility];  Service: Urology;  Laterality: N/A;   KNEE SURGERY     with replacement on  right knee   LEFT HEART CATH AND CORONARY ANGIOGRAPHY N/A 07/18/2021   Procedure: LEFT HEART CATH AND CORONARY ANGIOGRAPHY;  Surgeon: Tonny Bollman, MD;  Location: [facility] INVASIVE CV LAB;  Service: Cardiovascular;  Laterality: N/A;   PACEMAKER IMPLANT N/A 12/31/2021   Procedure: PACEMAKER IMPLANT;  Surgeon: Marinus Maw, MD;  Location: [facility] INVASIVE CV LAB;  Service: Cardiovascular;  Laterality: N/A;   TRANSURETHRAL RESECTION OF PROSTATE N/A 04/15/2022   Procedure: TRANSURETHRAL RESECTION OF THE PROSTATE (TURP);  Surgeon: Malen Gauze, MD;  Location: [facility];  Service: Urology;  Laterality: N/A;   Family History  Problem Relation Age of  Onset   Alzheimer's disease Neg Hx    Parkinson's disease Neg Hx    Social History   Socioeconomic History   Marital status: Widowed    Spouse name: Not on file   Number of children: Not on file   Years of education: Not on file   Highest education level: Not on file  Occupational History   Not on file  Tobacco Use   Smoking status: Never    Passive exposure: Never   Smokeless tobacco: Never  Vaping Use   Vaping status: Never Used  Substance and Sexual Activity   Alcohol use: Not Currently    Comment: occasional   Drug use: No   Sexual activity: Not Currently  Other Topics Concern   Not on file  Social History Narrative   Lives in his home on daughter's property    Social Determinants of Health   Financial Resource Strain: Not on file  Food Insecurity: No Food Insecurity (04/15/2022)   Hunger Vital Sign    Worried About Running Out of Food in the Last Year: Never true    Ran Out of Food in the Last Year: Never true  Transportation Needs: No Transportation Needs (04/15/2022)   PRAPARE - Administrator, Civil Service (Medical): No    Lack of Transportation (Non-Medical): No  Physical Activity: Not on file  Stress: Not on file  Social Connections: Not on file  Intimate Partner Violence: Not At Risk (04/15/2022)   Humiliation, Afraid, Rape, and Kick questionnaire    Fear of Current or Ex-Partner: No    Emotionally Abused: No    Physically Abused: No    Sexually Abused: No    Review of Systems Constitutional: Patient denies any unintentional weight loss or change in strength lntegumentary: Patient denies any rashes or pruritus Cardiovascular: Patient denies chest pain or syncope Respiratory: Patient denies shortness of breath Gastrointestinal: Patient denies nausea, vomiting, constipation, or diarrhea Musculoskeletal: Patient reports weakness Neurologic: Patient denies convulsions or seizures Hematologic/Lymphatic: Patient denies bleeding  tendencies Endocrine: Patient denies heat/cold intolerance  GU: As per HPI.  OBJECTIVE Vitals:   10/17/22 1534  BP: 134/72  Pulse: 74  Temp: 99.2 F (37.3 C)   There is no height or weight on file to calculate BMI.  Physical Examination Constitutional: No obvious distress; patient is non-toxic appearing  Cardiovascular: No visible lower extremity edema.  Respiratory: The patient does not have audible wheezing/stridor; respirations do not appear labored  Gastrointestinal: Abdomen non-distended Musculoskeletal: Normal ROM of UEs  Skin: No obvious rashes/open sores  Neurologic: CN 2-12 grossly intact Psychiatric: Answered questions appropriately with normal affect  Hematologic/Lymphatic/Immunologic: No obvious bruises or sites of spontaneous bleeding  GU: Catheter draining clear yellow urine.  ASSESSMENT OAB (overactive bladder) - Plan: INSERT,TEMP INDWELLING BLAD CATH,SIMPLE, cephALEXin (KEFLEX) capsule 500 mg  Chronic indwelling Foley catheter -  Plan: INSERT,TEMP INDWELLING BLAD CATH,SIMPLE, cephALEXin (KEFLEX) capsule 500 mg  Benign prostatic hyperplasia without lower urinary tract symptoms - Plan: INSERT,TEMP INDWELLING BLAD CATH,SIMPLE, cephALEXin (KEFLEX) capsule 500 mg  Ambulatory dysfunction - Plan: INSERT,TEMP INDWELLING BLAD CATH,SIMPLE, cephALEXin (KEFLEX) capsule 500 mg  Dementia due to Parkinson's disease, unspecified dementia severity, unspecified whether behavioral, psychotic, or mood disturbance or anxiety (HCC) - Plan: INSERT,TEMP INDWELLING BLAD CATH,SIMPLE, cephALEXin (KEFLEX) capsule 500 mg  He is doing well overall. Foley catheter exchanged today by nursing staff.   We discussed option to consider suprapubic (SP) tube placement. We reviewed that this is done via an outpatient surgical procedure in the OR under anesthesia. We discussed reasons why SP tube is preferred over long term use of indwelling urethral catheters since urethral catheters can cause  urethral sphincter erosion which may result in gross urinary incontinence over time. We discussed risks such as surgical site pain, infections, skin irritation / breakdown, chronic bacteriuria, symptomatic UTls. Patient was advised that SP tube would stay in continuously and that the insertion site will close if catheter is removed for more than a few hours. Discussed that the SP tube would need to be exchanged routinely every 4 weeks to prevent catheter from becoming clogged with sediment and that SP tube exchanges are typically performed at a urology nurse visit or by a home health nurse. We discussed that the SP tube can be removed in the future if patient elects to discontinue that intervention.   Patient & daughter elected to continue with indwelling Foley catheter at this time.   Will plan for follow up for nurse visit monthly; may be getting home health nursing through the [place] soon. If so they were made aware that an order could be placed for at-home catheter exchanges monthly if desired. They verbalized understanding and agreement. All questions were answered.   PLAN Advised the following: 1. Continue with chronic indwelling Foley catheter. 2. Return in about 4 weeks (around 11/14/2022) for nurse visit for Foley catheter exchange.  Orders Placed This Encounter  Procedures   INSERT,TEMP INDWELLING BLAD CATH,SIMPLE   It has been explained that the patient is to follow regularly with their PCP in addition to all other providers involved in their care and to follow instructions provided by these respective offices. Patient advised to contact urology clinic if any urologic-pertaining questions, concerns, new symptoms or problems arise in the interim period.  There are no Patient Instructions on file for this visit.  Electronically signed by:  Donnita Falls, FNP   10/17/22    4:28 PM

## 2022-10-16 ENCOUNTER — Telehealth: Payer: Self-pay | Admitting: Neurology

## 2022-10-16 ENCOUNTER — Encounter: Payer: Self-pay | Admitting: Neurology

## 2022-10-16 ENCOUNTER — Telehealth: Payer: Self-pay | Admitting: Internal Medicine

## 2022-10-16 NOTE — Telephone Encounter (Signed)
Mario Massey @[place]  [facility] stating that the initial referral ran out on Aug 11th,2024 for a new referral for additional services will require a new referral, please call.

## 2022-10-16 NOTE — Telephone Encounter (Signed)
[facility] was calling to keep a copy of patient last office visit notes that request a f/u. They are needing to know why a follow up is needed. Fax [phone] subject like put Cardiology. Please advise

## 2022-10-17 ENCOUNTER — Encounter: Payer: Self-pay | Admitting: Urology

## 2022-10-17 ENCOUNTER — Ambulatory Visit (INDEPENDENT_AMBULATORY_CARE_PROVIDER_SITE_OTHER): Payer: No Typology Code available for payment source | Admitting: Urology

## 2022-10-17 VITALS — BP 134/72 | HR 74 | Temp 99.2°F

## 2022-10-17 DIAGNOSIS — R339 Retention of urine, unspecified: Secondary | ICD-10-CM

## 2022-10-17 DIAGNOSIS — N400 Enlarged prostate without lower urinary tract symptoms: Secondary | ICD-10-CM

## 2022-10-17 DIAGNOSIS — R262 Difficulty in walking, not elsewhere classified: Secondary | ICD-10-CM

## 2022-10-17 DIAGNOSIS — Z978 Presence of other specified devices: Secondary | ICD-10-CM

## 2022-10-17 DIAGNOSIS — N3281 Overactive bladder: Secondary | ICD-10-CM

## 2022-10-17 DIAGNOSIS — G20A1 Parkinson's disease without dyskinesia, without mention of fluctuations: Secondary | ICD-10-CM

## 2022-10-17 MED ORDER — CEPHALEXIN 250 MG PO CAPS
500.0000 mg | ORAL_CAPSULE | Freq: Once | ORAL | Status: AC
Start: 2022-10-17 — End: 2022-10-17
  Administered 2022-10-17: 500 mg via ORAL

## 2022-10-17 NOTE — Progress Notes (Signed)
Cath Change/ Replacement  Patient is present today for a catheter change due to urinary retention.  10ml of water was removed from the balloon, a 20FR coude foley cath was removed without difficulty.  Patient was cleaned and prepped in a sterile fashion with betadine and 2% lidocaine jelly was instilled into the urethra. A 20 FR coude foley cath was replaced into the bladder, no complications were noted. Urine return was noted 15 ml and urine was yellow in color. The balloon was filled with 10ml of sterile water. A leg bag was attached for drainage.  A night bag was also given to the patient and patient was given instruction on how to change from one bag to another. Patient was given proper instruction on catheter care.    Performed by: Kennyth Lose, CMA  Follow up: No follow-ups on file.

## 2022-10-23 ENCOUNTER — Telehealth: Payer: Self-pay | Admitting: Internal Medicine

## 2022-10-23 NOTE — Telephone Encounter (Signed)
Calling to get copy of patient 9/3 transmitting. Fax [phone]. Please advise

## 2022-10-23 NOTE — Telephone Encounter (Signed)
Faxed on 10/23/22 at 9:08am to [phone]

## 2022-11-16 IMAGING — DX DG CHEST 1V PORT
1 series · 1 of 1 positions shown · non-contrast
Comparison: 12/01/2013

CLINICAL DATA: Chest pain

EXAM:
PORTABLE CHEST 1 VIEW

[chest ap grid]
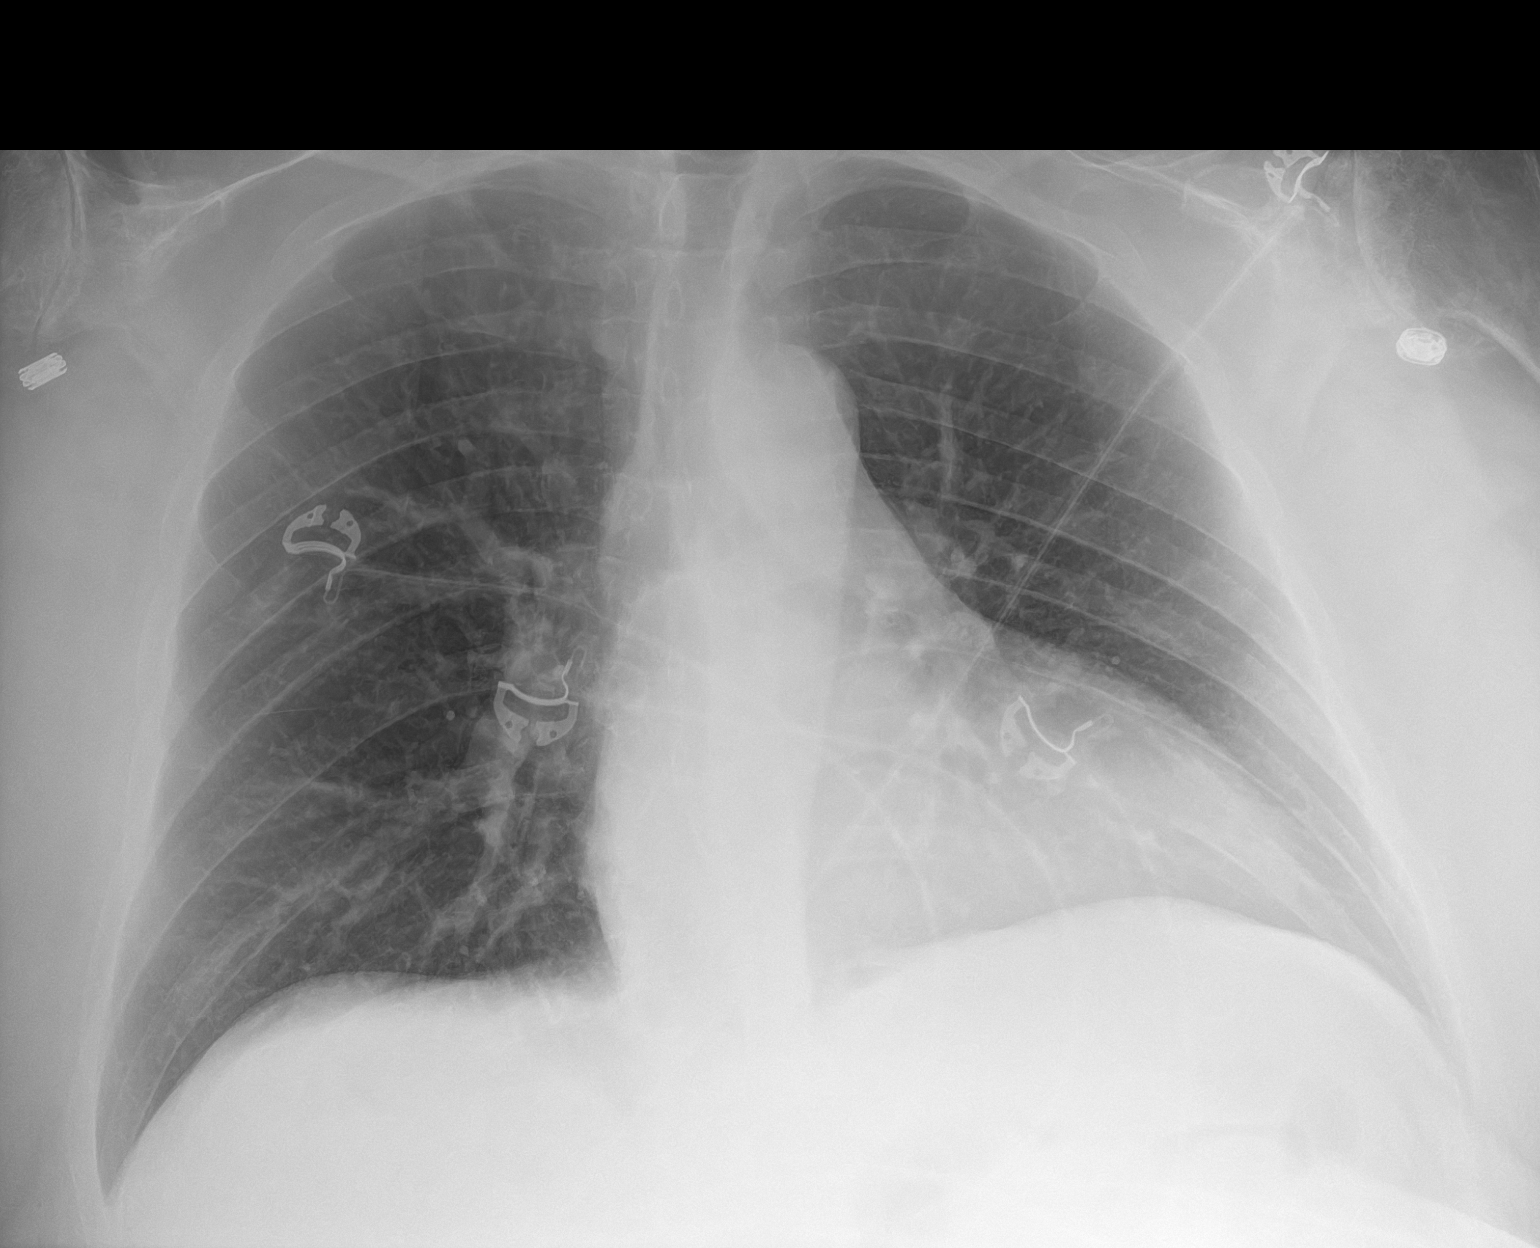

[1 of 1 positions shown; findings below may reference images not displayed]

FINDINGS: Borderline heart size accentuated by technique. Stable aortic and
hilar contours. There is no edema, consolidation, effusion, or
pneumothorax.

Advanced glenohumeral osteoarthritis on both sides.
IMPRESSION: No evidence of acute disease.

## 2022-11-19 ENCOUNTER — Ambulatory Visit: Payer: No Typology Code available for payment source

## 2022-11-19 DIAGNOSIS — R339 Retention of urine, unspecified: Secondary | ICD-10-CM | POA: Diagnosis not present

## 2022-11-19 MED ORDER — CIPROFLOXACIN HCL 500 MG PO TABS
500.0000 mg | ORAL_TABLET | Freq: Once | ORAL | Status: AC
Start: 2022-11-19 — End: 2022-11-19
  Administered 2022-11-19: 500 mg via ORAL

## 2022-11-19 NOTE — Progress Notes (Signed)
Cath Change/ Replacement  Patient is present today for a catheter change due to urinary retention.  10ml of water was removed from the balloon, a 20FR coude foley cath was removed without difficulty.  Patient was cleaned and prepped in a sterile fashion with betadine and 2% lidocaine jelly was instilled into the urethra. A 20 FR coude foley cath was replaced into the bladder, no complications were noted. Urine return was noted 10ml and urine was yellow in color. The balloon was filled with 10ml of sterile water. A leg bag was attached for drainage.  Patient was given proper instruction on catheter care.    Performed by: Guss Bunde, CMA  Follow up: as scheduled.

## 2022-11-20 ENCOUNTER — Telehealth: Payer: Self-pay | Admitting: Neurology

## 2022-11-20 NOTE — Telephone Encounter (Signed)
Pt's daughter, Farrel Gordon, [facility]) having hallucinations: seeing animals running around, under his bed, cannot remember he just ate and keeps eating. May have to lock up all the food to stop him from over eating, he thinks I'm his sister, gets upset after I do something he ask me to do. Would like a call from the nurse to discuss what needs to be done-  Pt said no one has called about pt's CPAP machine.

## 2022-11-20 NOTE — Telephone Encounter (Signed)
Please call daughter to discuss AutoPap therapy, I placed an order in September.  I would recommend that she get him checked out by PCP for acute exacerbation of hallucinations for any acute medical reasons such as dehydration, UTI, electrolyte disturbance, or other infection.  If she cannot take him to PCP, she may need to take him to the emergency room.

## 2022-11-20 NOTE — Telephone Encounter (Addendum)
I called pts daughter.  He has been getting worse she says for over the last month.  Hallucinations, gorging on food.  He see's his urologist every month for testing of UTI and has been negative.  See's cardiolgy/pcp every 2-3 month for lab check warfarin.  She states it is not an acute, but has worsened over the last month.  He has not started on cpap due to [facility].  She could not give me # to [place].  [facility].   I tried to call [facility] but no answer will need to follow up on.  I relayed Dr. Johny Sax message about see pcp for acute infections, and also ED if cannot take to pcp.

## 2022-11-20 NOTE — Telephone Encounter (Signed)
I recommend she talk to PCP and/or [facility] provider about seeing psychiatry next, ideally geriatric psychiatry. We can have have the NP appt moved up but not sure what we can offer at this time. Recommend she have him start CPAP ASAP, as this was ordered over a month ago. Please have her follow through on this. She will have to find out if [place] will provide the CPAP or if they want to go through insurance and outside of [place].

## 2022-11-21 NOTE — Telephone Encounter (Signed)
Thank you, Andrey Campanile, for reaching out to his daughter again.  It is unfortunate that she hung up on you.   As you know, the situation is complicated. He has a complicated medical history, significant heart disease, urinary retention, mood disorder, and has now been diagnosed with significant sleep apnea as well.  He sees multiple specialists.  We did appropriate workup for his memory loss, and he could not tolerate donepezil, unfortunately.  He had a head CT scan, he had workup with blood work as well.  For his significant obstructive sleep apnea, he has not yet started treatment with a PAP machine.  Untreated sleep apnea increases risk for dementia and heart disease and may worsen an underlying mood disorder.  Not sleeping well can exacerbate hallucinations and behavioral changes, unfortunately.  Treatment for sleep apnea is my next recommendation for his treatment.  I think you have already discussed this with her at length.  If [facility] is scheduled to see the [facility], sleep apnea treatment and follow-up should be through them.  She will have to go through the appropriate channels for that.  Neck step should be follow-up with PCP at the [facility].

## 2022-11-21 NOTE — Telephone Encounter (Addendum)
I checked with Aundra Millet NP about seeing pt, and she said would have pt continue with Dr. Teofilo Pod original plan.   I called pts daughter back regarding Dr. Teofilo Pod additional recommendation about contacting pcp for geriatric psychiatry. He will continue to have hallucinations and I relayed that medications used for hallucinations seroquel,- black box warnings, nuplazid are ones she does not really recommend at his age , due to drowsiness.  He has untreated OSA, and needs cpap which could be contributing to worsening hallucinations.  Tonya did not feel that infection was an issue as see's urology/pcp/cardiology regularly.   I did follow up on this for pt by contacting thru multiple phone calls to [place], finally getting in touch with Dorathy Daft  [id number]  [phone] at the Sleep clinic at [facility] .  She said that pt will need to be seen by pcp, at [facility] (Dr. Audree Camel) for sleep consult now (to be sent to sleep clinic). I faxed to pcp office [phone] ([place]) the [facility], Sleep Consult, order, demogs and received fax confirmation 25 pgs.  I relayed this to daughter.  She took the [place] # [phone] to follow up on.  She wanted to know why we could not help her daddy.  We see for dementia.  Every time she calls (she is told my have infection, see pcp).  She sees ads all the time on TV about medications used for dementia.  She does not understand why we can't help him.  I relayed that I did not have anything more to offer then Dr. Teofilo Pod messages. She hung up.

## 2022-11-21 NOTE — Telephone Encounter (Signed)
Noted  

## 2022-12-04 ENCOUNTER — Telehealth: Payer: Self-pay | Admitting: Neurology

## 2022-12-04 NOTE — Telephone Encounter (Addendum)
I called the [facility] [phone]. Spoke to rep about the request form for service.  Per daughter, pt needs this form faxed or emailed to [facility] for pt to get his DME cpap machine and supplies. (Once it is received they will call pt when available to pick up then educated on machine/ supplies , fitted for mask).  Daughter relayed that pt will be followed here for pts sleep apnea.  . I tried and called today but now answer right now.  I did relay that I wanted to make sure that pt will be instructed on the equipment, as we do not do this and the form specifies that if education is not done this will be machine will be sent to providers office.

## 2022-12-04 NOTE — Telephone Encounter (Signed)
Pt's daughter, Farrel Gordon stated [facility] has not received the order for the CPAP. Phone number to reach [facility] is [phone], ext [phone] . Request form was included with the referral. Request form needs to be fill out and faxed back. Would like a call back to confirm form has been fax.

## 2022-12-05 NOTE — Telephone Encounter (Signed)
I recalled the [facility] of the [place].  It was relayed to me by representative, that for pt to start PAP therapy a request form will need to be completed for pt to get pap machine and be educated on this (along with supplies/mask fittings). They are acting DME, we would just follow up for OSA.    Per daughter, they will be following up with [place] for pap therapy, just needs this form faxed.  180 day authorization is up 03-04-2023 for sleep, this will be reauth prior to expiration date.  I will speak to Dr. Frances Furbish again, on my findings and proceed forthwith with her recommendations.

## 2022-12-05 NOTE — Telephone Encounter (Signed)
I emailed to [email or web address]  subject line [email or web address] [email or web address]  the request form for DME APAP and also sent sleep study and order for APAP.  Received note that was sent successfully and securely delivered.  I spoke to pts daughter and relayed that did email and received that was delivered.  I made 3 month appt 03-03-2023 for initial autopap appt.

## 2022-12-05 NOTE — Telephone Encounter (Signed)
DME order signed, pls send back to [place] [facility].

## 2022-12-18 ENCOUNTER — Ambulatory Visit: Payer: No Typology Code available for payment source

## 2022-12-18 DIAGNOSIS — R339 Retention of urine, unspecified: Secondary | ICD-10-CM | POA: Diagnosis not present

## 2022-12-18 NOTE — Progress Notes (Signed)
Cath Change/ Replacement  Patient is present today for a catheter change due to urinary retention.  10ml of water was removed from the balloon, a 20FR coude foley cath was removed without difficulty.  Patient was cleaned and prepped in a sterile fashion with betadine and 2% lidocaine jelly was instilled into the urethra. A 20 FR coude foley cath was replaced into the bladder, no complications were noted. Urine return was noted 30ml and urine was yellow in color. The balloon was filled with 10ml of sterile water. A leg bag was attached for drainage.  A night bag was also given to the patient and patient was given instruction on how to change from one bag to another. Patient was given proper instruction on catheter care.    Performed by: Guss Bunde, CMA  Follow up:as scheduled

## 2022-12-19 ENCOUNTER — Encounter: Payer: Self-pay | Admitting: Internal Medicine

## 2022-12-19 ENCOUNTER — Other Ambulatory Visit: Payer: Self-pay

## 2022-12-19 ENCOUNTER — Ambulatory Visit: Payer: No Typology Code available for payment source | Attending: Internal Medicine | Admitting: Internal Medicine

## 2022-12-19 ENCOUNTER — Emergency Department (HOSPITAL_COMMUNITY)
Admission: EM | Admit: 2022-12-19 | Discharge: 2022-12-19 | Discharge status: Against Medical Advice | Payer: No Typology Code available for payment source | Attending: Emergency Medicine | Admitting: Emergency Medicine

## 2022-12-19 ENCOUNTER — Encounter (HOSPITAL_COMMUNITY): Payer: Self-pay

## 2022-12-19 ENCOUNTER — Telehealth: Payer: Self-pay | Admitting: Urology

## 2022-12-19 VITALS — BP 128/72 | HR 74 | Ht 72.0 in | Wt 279.0 lb

## 2022-12-19 DIAGNOSIS — T83091A Other mechanical complication of indwelling urethral catheter, initial encounter: Secondary | ICD-10-CM | POA: Insufficient documentation

## 2022-12-19 DIAGNOSIS — G20A1 Parkinson's disease without dyskinesia, without mention of fluctuations: Secondary | ICD-10-CM | POA: Diagnosis not present

## 2022-12-19 DIAGNOSIS — I10 Essential (primary) hypertension: Secondary | ICD-10-CM | POA: Diagnosis not present

## 2022-12-19 DIAGNOSIS — Z5329 Procedure and treatment not carried out because of patient's decision for other reasons: Secondary | ICD-10-CM | POA: Diagnosis not present

## 2022-12-19 DIAGNOSIS — I4819 Other persistent atrial fibrillation: Secondary | ICD-10-CM | POA: Diagnosis not present

## 2022-12-19 DIAGNOSIS — Z7901 Long term (current) use of anticoagulants: Secondary | ICD-10-CM | POA: Insufficient documentation

## 2022-12-19 DIAGNOSIS — I442 Atrioventricular block, complete: Secondary | ICD-10-CM

## 2022-12-19 DIAGNOSIS — Z950 Presence of cardiac pacemaker: Secondary | ICD-10-CM | POA: Diagnosis not present

## 2022-12-19 DIAGNOSIS — T839XXA Unspecified complication of genitourinary prosthetic device, implant and graft, initial encounter: Secondary | ICD-10-CM

## 2022-12-19 DIAGNOSIS — I2510 Atherosclerotic heart disease of native coronary artery without angina pectoris: Secondary | ICD-10-CM | POA: Insufficient documentation

## 2022-12-19 DIAGNOSIS — F0390 Unspecified dementia without behavioral disturbance: Secondary | ICD-10-CM | POA: Diagnosis not present

## 2022-12-19 LAB — CUP PACEART INCLINIC DEVICE CHECK
Battery Remaining Longevity: 96 mo
Battery Voltage: 3.02 V
Brady Statistic RA Percent Paced: 0 %
Brady Statistic RV Percent Paced: 97 %
Date Time Interrogation Session: 20241121124647
Implantable Lead Connection Status: 753985
Implantable Lead Connection Status: 753985
Implantable Lead Implant Date: 20231204
Implantable Lead Implant Date: 20231204
Implantable Lead Location: 753859
Implantable Lead Location: 753860
Implantable Pulse Generator Implant Date: 20231204
Lead Channel Impedance Value: 412.5 Ohm
Lead Channel Impedance Value: 462.5 Ohm
Lead Channel Pacing Threshold Amplitude: 0.75 V
Lead Channel Pacing Threshold Amplitude: 0.75 V
Lead Channel Pacing Threshold Pulse Width: 0.5 ms
Lead Channel Pacing Threshold Pulse Width: 0.5 ms
Lead Channel Sensing Intrinsic Amplitude: 1.4 mV
Lead Channel Sensing Intrinsic Amplitude: 9.2 mV
Lead Channel Setting Pacing Amplitude: 2 V
Lead Channel Setting Pacing Amplitude: 2.5 V
Lead Channel Setting Pacing Pulse Width: 0.5 ms
Lead Channel Setting Sensing Sensitivity: 0.5 mV
Pulse Gen Model: 2272
Pulse Gen Serial Number: 8111795

## 2022-12-19 MED ORDER — ATORVASTATIN CALCIUM 40 MG PO TABS: 40.0000 mg | ORAL_TABLET | Freq: Every day | ORAL | 3 refills | Status: DC

## 2022-12-19 NOTE — ED Provider Notes (Signed)
[place] EMERGENCY DEPARTMENT AT [facility] Provider Note   CSN: [id number] Arrival date & time: 12/19/22  1623     History  Chief Complaint  Patient presents with   Foley Probelm    Mario Massey is a 77 y.o. male.  Patient with history of hypertension, hyperlipidemia, NSTEMI, Parkinson's, indwelling foley catheter presents today with foley catheter problem. He states that he has had this catheter for years and had it exchanged routinely at his urology office yesterday.  Does states that they did have some issues getting it to go in at the appointment.  Since then he has had leakage from around his catheter site.  Does state that the catheter continues to flow into the bag as well.  The urine coming out from around the catheter is causing some skin breakdown on his inner legs which is uncomfortable.  Denies any other pain.  No abdominal pain, nausea, vomiting, or diarrhea.  No fevers or chills.  Does state that he went to his urology office today to see if they could change it out and the patient was told that they did not have time to do so today and therefore he came here for management.  The history is provided by the patient. No language interpreter was used.       Home Medications Prior to Admission medications   Medication Sig Start Date End Date Taking? Authorizing Provider  AMBULATORY NON FORMULARY MEDICATION Medication Name: Depend briefs 04/22/22   Malen Gauze, MD  atorvastatin (LIPITOR) 40 MG tablet Take 1 tablet (40 mg total) by mouth daily. Please call [phone] to schedule an appointment with Dr. Zoila Shutter for future refills. Thank you. 12/19/22   Marinus Maw, MD  bacitracin ointment Apply topically 2 (two) times daily. 04/23/21   Marguerita Merles Latif, DO  benazepril (LOTENSIN) 10 MG tablet Take 10 mg by mouth daily. 10/31/22   [provider]  divalproex (DEPAKOTE ER) 250 MG 24 hr tablet Take 250 mg by mouth daily.    [provider]  lisinopril (ZESTRIL) 5 MG tablet Take 1 tablet (5 mg total) by mouth daily. 05/11/21   Sharee Holster, NP  Multiple Vitamin (MULTIVITAMIN WITH MINERALS) TABS tablet Take 1 tablet by mouth daily.    [provider]  nitroGLYCERIN (NITROSTAT) 0.4 MG SL tablet Place 1 tablet (0.4 mg total) under the tongue every 5 (five) minutes as needed for chest pain. 10/10/21   Dyann Kief, PA-C  tadalafil (CIALIS) 5 MG tablet Take 5 mg by mouth daily. 10/06/22   [provider]  venlafaxine XR (EFFEXOR-XR) 150 MG 24 hr capsule Take 1 capsule (150 mg total) by mouth daily. 05/11/21   Sharee Holster, NP  Vibegron (GEMTESA) 75 MG TABS Take 1 tablet by mouth daily.    [provider]  warfarin (COUMADIN) 5 MG tablet Take 1 tablet (5 mg total) by mouth daily. 03/26/22   Marinus Maw, MD      Allergies    Patient has no known allergies.    Review of Systems   Review of Systems  All other systems reviewed and are negative.   Physical Exam Updated Vital Signs BP 135/66 (BP Location: Right Arm)   Pulse 70   Temp 98.4 F (36.9 C) (Oral)   Resp 17   Ht 6' (1.829 m)   Wt 127.9 kg   SpO2 100%   BMI 38.25 kg/m  Physical Exam Vitals and nursing note  reviewed.  Constitutional:      General: He is not in acute distress.    Appearance: Normal appearance. He is normal weight. He is not ill-appearing, toxic-appearing or diaphoretic.  HENT:     Head: Normocephalic and atraumatic.  Cardiovascular:     Rate and Rhythm: Normal rate.  Pulmonary:     Effort: Pulmonary effort is normal. No respiratory distress.  Abdominal:     General: Abdomen is flat.     Palpations: Abdomen is soft.     Tenderness: There is no abdominal tenderness.  Genitourinary:    Comments: Foley catheter in place in the urethra.  Does appear to have some leakage from around the catheter.  There is some redness in his groin area that is mild.  No skin breakdown.  No discharge or  purulence. Musculoskeletal:        General: Normal range of motion.     Cervical back: Normal range of motion.  Skin:    General: Skin is warm and dry.  Neurological:     General: No focal deficit present.     Mental Status: He is alert.  Psychiatric:        Mood and Affect: Mood normal.        Behavior: Behavior normal.     ED Results / Procedures / Treatments   Labs (all labs ordered are listed, but only abnormal results are displayed) Labs Reviewed - No data to display  EKG None  Radiology CUP PACEART [facility] DEVICE CHECK  Result Date: 12/19/2022 Pacemaker check in clinic. Normal device function. Thresholds, sensing, impedance's consistent with previous measurements. Device programmed to maximize longevity. No mode switch or high ventricular rates noted. Device programmed at appropriate safety margins. Histogram distribution appropriate for patient activity level. Device programmed to optimize intrinsic conduction. Estimated longevity 8 years. Patient enrolled in remote follow-up. Patient education completed.Jeannine Kitten, RN   Procedures Procedures    Medications Ordered in ED Medications - No data to display  ED Course/ Medical Decision Making/ A&P                                 Medical Decision Making  This patient is a 77 y.o. male  who presents to the ED for concern of foley catheter problem.   Differential diagnoses prior to evaluation: The emergent differential diagnosis includes, but is not limited to,  clogged catheter, ruptured catheter balloon, improper catheter placement. This is not an exhaustive differential.   Past Medical History / Co-morbidities / Social History:  has a past medical history of Ascending aorta dilation (HCC), Atrial flutter (HCC), AV dissociation, Bipolar disorder (HCC), Coronary artery disease, Dementia (HCC), Depression, Essential hypertension, Foley catheter in place, Hyperlipidemia LDL goal <70, Hypertension, Myocardial  infarction (HCC), Presence of permanent cardiac pacemaker, RBBB, and Second degree AV block, Mobitz type I.  Additional history: Chart reviewed. Pertinent results include: patient sees urology Dr. Ronne Binning for his foley catheter management  Physical Exam: Physical exam performed. The pertinent findings include: Foley catheter within the urethra with some urine leaking out around the edges.  There is urine in the Foley bag as well.  Abdomen soft and nontender.  Mild erythema in the groin area likely due to irritation from urine.  No wounds or skin breakdown.  No purulence.  Consultations Obtained: I requested consultation with the urology on call Dr. Liliane Shi,  and discussed lab and imaging findings as well as  pertinent plan - they recommend: flushing the catheter to clear any debris and then deflating and re-inflating the balloon. If no improvement with this or signs of retention, then can attempt catheter exchange.    Disposition: After consideration of the diagnostic results and the patients response to treatment, I feel that emergency department workup does not suggest an emergent condition requiring admission or immediate intervention beyond what has been performed at this time. The plan is: Discharge with close urology follow-up and return precautions.  After the Foley catheter was flushed and the balloon was deflated and then reinflated, appears that the catheter is no longer leaking.  The catheter flushed well.  Patient has no signs or symptoms of urinary retention.  Discussed with patient risks versus benefits of exchanging the catheter.  Unfortunately patient has a 20 [place] coud catheter and we do not have 1 of these in the hospital, we only have smaller sizes.  Reportedly, urology had trouble getting the catheter in yesterday evening.  Concern is that if we remove this catheter we may have difficulty or be unable to replace it which would require transfer to [facility] for urology evaluation.   Given this, patient would prefer to attempt to see his urologist tomorrow outpatient.  Recommend barrier cream in the patients groin and gauze placed around the catheter to help reduce skin exposure to urine. Also recommend reaching out to urology Dr. Ronne Binning first thing tomorrow if the catheter does begin to leak again.  Patient and daughter at bedside are understanding and in agreement with this plan.  Patient has no other concerns, no indication for further evaluation with labs or imaging at this time.  Evaluation and diagnostic testing in the emergency department does not suggest an emergent condition requiring admission or immediate intervention beyond what has been performed at this time.  Plan for discharge with close PCP follow-up.  Patient is understanding and amenable with plan, educated on red flag symptoms that would prompt immediate return.  Patient discharged in stable condition.   Of note, informed by staff that the patient and his daughter unfortunately left prior to receiving discharge paperwork.  Staff did have the patient's sign out AMA given he had not been officially discharged yet.  Final Clinical Impression(s) / ED Diagnoses Final diagnoses:  Problem with Foley catheter, initial encounter [facility])    Rx / DC Orders ED Discharge Orders     None         Vear Clock 12/19/22 2353    Bethann Berkshire, MD 12/20/22 1201

## 2022-12-19 NOTE — ED Notes (Signed)
Contacted [facility] for 20 [place] Coude Foley catheter.

## 2022-12-19 NOTE — ED Notes (Signed)
Pt foley is leaking at insertion site, pt is also complaining of pain between his legs where he does have some redness to where it appears that patient has chaffed from leaking urine.

## 2022-12-19 NOTE — ED Triage Notes (Signed)
Foley placed at urology yesterday  Pt has chronic foley and exchanged every 30 days  Pt is leaking around foley at the tip of penis, urine still going in bag, per family.   Pt denies pain

## 2022-12-19 NOTE — Patient Instructions (Signed)

## 2022-12-19 NOTE — ED Notes (Signed)
Daughter came to this RN with pt in wheelchair Daughter wanted pt singed out now [facility] signed

## 2022-12-19 NOTE — Progress Notes (Signed)
HPI [name] returns today for ongoing evaluation s/p PPM insertion. He is a pleasant 77 yo man with AFlutter and intermittent CHB. He also has mild dementia. He denies a h/o syncope. No chest pain or sob. He has periods of weakness which I suspect is due to his bradycardia. These have improved.  He had not been taking his xarelto and was switched to warfarin when I saw him last. He has class 2 dyspnea. He has been found to have sleep apnea and has been started on CPAP. No Known Allergies   Current Outpatient Medications  Medication Sig Dispense Refill   AMBULATORY NON FORMULARY MEDICATION Medication Name: Depend briefs 2 Package 12   atorvastatin (LIPITOR) 40 MG tablet Take 1 tablet (40 mg total) by mouth daily. Please call [phone] to schedule an appointment with Dr. Zoila Shutter for future refills. Thank you. 30 tablet 0   bacitracin ointment Apply topically 2 (two) times daily. 120 g 0   divalproex (DEPAKOTE ER) 250 MG 24 hr tablet Take 250 mg by mouth daily.     lisinopril (ZESTRIL) 5 MG tablet Take 1 tablet (5 mg total) by mouth daily. 30 tablet 0   Multiple Vitamin (MULTIVITAMIN WITH MINERALS) TABS tablet Take 1 tablet by mouth daily.     nitroGLYCERIN (NITROSTAT) 0.4 MG SL tablet Place 1 tablet (0.4 mg total) under the tongue every 5 (five) minutes as needed for chest pain. 25 tablet 3   tadalafil (CIALIS) 5 MG tablet Take 5 mg by mouth daily.     venlafaxine XR (EFFEXOR-XR) 150 MG 24 hr capsule Take 1 capsule (150 mg total) by mouth daily. 30 capsule 0   Vibegron (GEMTESA) 75 MG TABS Take 1 tablet by mouth daily.     warfarin (COUMADIN) 5 MG tablet Take 1 tablet (5 mg total) by mouth daily. 30 tablet 3   benazepril (LOTENSIN) 10 MG tablet Take 10 mg by mouth daily.     No current facility-administered medications for this visit.     Past Medical History:  Diagnosis Date   Ascending aorta dilation (HCC)    Atrial flutter (HCC)    AV dissociation    Bipolar  disorder (HCC)    Coronary artery disease    Dementia [facility])    daughter states he asks alot of questions when he is nervous.   Depression    Essential hypertension    Foley catheter in place    replaced July 2024   Hyperlipidemia LDL goal <70    Hypertension    Myocardial infarction [facility])    Presence of permanent cardiac pacemaker    RBBB    Second degree AV block, Mobitz type I     ROS:   All systems reviewed and negative except as noted in the HPI.   Past Surgical History:  Procedure Laterality Date   CYSTOSCOPY N/A 04/15/2022   Procedure: CYSTOSCOPY;  Surgeon: Malen Gauze, MD;  Location: [facility] ORS;  Service: Urology;  Laterality: N/A;   KNEE SURGERY     with replacement on right knee   LEFT HEART CATH AND CORONARY ANGIOGRAPHY N/A 07/18/2021   Procedure: LEFT HEART CATH AND CORONARY ANGIOGRAPHY;  Surgeon: Tonny Bollman, MD;  Location: [facility] INVASIVE CV LAB;  Service: Cardiovascular;  Laterality: N/A;   PACEMAKER IMPLANT N/A 12/31/2021   Procedure: PACEMAKER IMPLANT;  Surgeon: Marinus Maw, MD;  Location: [facility] INVASIVE CV LAB;  Service: Cardiovascular;  Laterality: N/A;   TRANSURETHRAL RESECTION OF  PROSTATE N/A 04/15/2022   Procedure: TRANSURETHRAL RESECTION OF THE PROSTATE (TURP);  Surgeon: Malen Gauze, MD;  Location: [facility] ORS;  Service: Urology;  Laterality: N/A;     Family History  Problem Relation Age of Onset   Alzheimer's disease Neg Hx    Parkinson's disease Neg Hx      Social History   Socioeconomic History   Marital status: Widowed    Spouse name: Not on file   Number of children: Not on file   Years of education: Not on file   Highest education level: Not on file  Occupational History   Not on file  Tobacco Use   Smoking status: Never    Passive exposure: Never   Smokeless tobacco: Never  Vaping Use   Vaping status: Never Used  Substance and Sexual Activity   Alcohol use: Not Currently    Comment: occasional   Drug use: No   Sexual  activity: Not Currently  Other Topics Concern   Not on file  Social History Narrative   Lives in his home on daughter's property    Social Determinants of Health   Financial Resource Strain: Not on file  Food Insecurity: No Food Insecurity (04/15/2022)   Hunger Vital Sign    Worried About Running Out of Food in the Last Year: Never true    Ran Out of Food in the Last Year: Never true  Transportation Needs: No Transportation Needs (04/15/2022)   PRAPARE - Administrator, Civil Service (Medical): No    Lack of Transportation (Non-Medical): No  Physical Activity: Not on file  Stress: Not on file  Social Connections: Not on file  Intimate Partner Violence: Not At Risk (04/15/2022)   Humiliation, Afraid, Rape, and Kick questionnaire    Fear of Current or Ex-Partner: No    Emotionally Abused: No    Physically Abused: No    Sexually Abused: No     Ht 6' (1.829 m)   Wt 279 lb (126.6 kg)   BMI 37.84 kg/m   Physical Exam:  Well appearing NAD HEENT: Unremarkable Neck:  No JVD, no thyromegally Lymphatics:  No adenopathy Back:  No CVA tenderness Lungs:  Clear HEART:  Regular rate rhythm, no murmurs, no rubs, no clicks Abd:  soft, positive bowel sounds, no organomegally, no rebound, no guarding Ext:  2 plus pulses, no edema, no cyanosis, no clubbing Skin:  No rashes no nodules Neuro:  CN II through XII intact, motor grossly intact  DEVICE  Normal device function.  See PaceArt for details.   Assess/Plan:  CHB - He is asymptomatic s/p PPM insertion. 2. PAF - he is mostly out of rhythm. He has started taking warfarin. 3. Coags - he has not been bleeding but is not taking his meds correctly. His INR is checked by his primary MD in [place]. 4. PPM - his [employer] DDD PM is working normally.     Sharlot Gowda Jazzmyne Rasnick,MD

## 2022-12-19 NOTE — Telephone Encounter (Signed)
Pt has pulled catheter partly out she wants someone to call asap

## 2022-12-19 NOTE — ED Notes (Signed)
PC from [facility]. There is not a 20 [place] coude catheter available. MD notified.

## 2022-12-19 NOTE — ED Notes (Signed)
Deflated the foley balloon. Contained 10 cc of sterile water. Reinflated without resistance. Emptied the leg bag, contained 350 cc clear, dark amber urine. Irrigated the foley with 500 cc of sterile water. Will monitor for urine output for 10 minutes. No evidence of urine leaking around the head of the penis.

## 2022-12-20 NOTE — Telephone Encounter (Signed)
Please advise pt to be evaluated by urgent care, unfortunately our schedule does not allow any overbooks at this time due to staffing.  They can be seen on a NV on 12/20/2022 at 0930am if they agree.

## 2022-12-20 NOTE — Telephone Encounter (Signed)
Advised that we could see pt this morning at 9 or 930. Caller hung up on me

## 2022-12-23 ENCOUNTER — Ambulatory Visit (INDEPENDENT_AMBULATORY_CARE_PROVIDER_SITE_OTHER): Payer: No Typology Code available for payment source | Admitting: Urology

## 2022-12-23 VITALS — BP 132/69 | HR 64

## 2022-12-23 DIAGNOSIS — R339 Retention of urine, unspecified: Secondary | ICD-10-CM

## 2022-12-23 DIAGNOSIS — N3000 Acute cystitis without hematuria: Secondary | ICD-10-CM | POA: Diagnosis not present

## 2022-12-23 DIAGNOSIS — Z978 Presence of other specified devices: Secondary | ICD-10-CM

## 2022-12-23 MED ORDER — NITROFURANTOIN MONOHYD MACRO 100 MG PO CAPS: 100.0000 mg | ORAL_CAPSULE | Freq: Two times a day (BID) | ORAL | 0 refills | Status: DC

## 2022-12-23 NOTE — Progress Notes (Unsigned)
post void residual=3

## 2022-12-23 NOTE — Progress Notes (Unsigned)
12/23/2022 1:46 PM   Mario Massey [birth year] [id number]  Referring provider: Toma Deiters, MD [address] [place],  [place] [address]  Urinary incontinence   HPI: Mario Massey is a 77yo here for followup for urinary retention with chronic indwelling foley. A day after the foley was placed he developed urinary urgency, pelvic pain and leakage around the foley. He has worsening nausea and decreased appetite.    PMH: Past Medical History:  Diagnosis Date   Ascending aorta dilation (HCC)    Atrial flutter (HCC)    AV dissociation    Bipolar disorder (HCC)    Coronary artery disease    Dementia [facility])    daughter states he asks alot of questions when he is nervous.   Depression    Essential hypertension    Foley catheter in place    replaced July 2024   Hyperlipidemia LDL goal <70    Hypertension    Myocardial infarction [facility])    Presence of permanent cardiac pacemaker    RBBB    Second degree AV block, Mobitz type I     Surgical History: Past Surgical History:  Procedure Laterality Date   CYSTOSCOPY N/A 04/15/2022   Procedure: CYSTOSCOPY;  Surgeon: Malen Gauze, MD;  Location: [facility];  Service: Urology;  Laterality: N/A;   KNEE SURGERY     with replacement on right knee   LEFT HEART CATH AND CORONARY ANGIOGRAPHY N/A 07/18/2021   Procedure: LEFT HEART CATH AND CORONARY ANGIOGRAPHY;  Surgeon: Tonny Bollman, MD;  [facility] INVAS[facility] CV LAB;  Service: Cardiovascular;  Laterality: N/A;   PACEMAKER IMPLANT N/A 12/31/2021   Procedure: PACEMAKER IMPLANT;  Surgeon: Marinus Maw, MD;  Location: [facility] INVASIVE CV LAB;  Service: Cardiovascular;  Laterality: N/A;   TRANSURETHRAL RESECTION OF PROSTATE N/A 04/15/2022   Procedure: TRANSURETHRAL RESECTION OF THE PROSTATE (TURP);  Surgeon: Malen Gauze, MD;  Location: [facility];  Service: Urology;  Laterality: N/A;    Home Medications:  Allergies as of 12/23/2022   No Known Allergies      Medication List         Accurate as of December 23, 2022  1:46 PM. If you have any questions, ask your nurse or doctor.          AMBULATORY NON FORMULARY MEDICATION Medication Name: Depend briefs   atorvastatin 40 MG tablet Commonly known as: LIPITOR Take 1 tablet (40 mg total) by mouth daily. Please call [phone] to schedule an appointment with Dr. Zoila Shutter for future refills. Thank you.   bacitracin ointment Apply topically 2 (two) times daily.   benazepril 10 MG tablet Commonly known as: LOTENSIN Take 10 mg by mouth daily.   divalproex 250 MG 24 hr tablet Commonly known as: DEPAKOTE ER Take 250 mg by mouth daily.   Gemtesa 75 MG Tabs Generic drug: Vibegron Take 1 tablet by mouth daily.   lisinopril 5 MG tablet Commonly known as: ZESTRIL Take 1 tablet (5 mg total) by mouth daily.   multivitamin with minerals Tabs tablet Take 1 tablet by mouth daily.   nitroGLYCERIN 0.4 MG SL tablet Commonly known as: NITROSTAT Place 1 tablet (0.4 mg total) under the tongue every 5 (five) minutes as needed for chest pain.   tadalafil 5 MG tablet Commonly known as: CIALIS Take 5 mg by mouth daily.   venlafaxine XR 150 MG 24 hr capsule Commonly known as: EFFEXOR-XR Take 1 capsule (150 mg total) by mouth daily.   warfarin  5 MG tablet Commonly known as: COUMADIN Take 1 tablet (5 mg total) by mouth daily.        Allergies: No Known Allergies  Family History: Family History  Problem Relation Age of Onset   Alzheimer's disease Neg Hx    Parkinson's disease Neg Hx     Social History:  reports that he has never smoked. He has never been exposed to tobacco smoke. He has never used smokeless tobacco. He reports that he does not currently use alcohol. He reports that he does not use drugs.  ROS: All other review of systems were reviewed and are negative except what is noted above in HPI  Physical Exam: BP 132/69   Pulse 64   Constitutional:  Alert and oriented, No acute  distress. HEENT: [place] AT, moist mucus membranes.  Trachea midline, no masses. Cardiovascular: No clubbing, cyanosis, or edema. Respiratory: Normal respiratory effort, no increased work of breathing. GI: Abdomen is soft, nontender, nondistended, no abdominal masses GU: No CVA tenderness.  Lymph: No cervical or inguinal lymphadenopathy. Skin: No rashes, bruises or suspicious lesions. Neurologic: Grossly intact, no focal deficits, moving all 4 extremities. Psychiatric: Normal mood and affect.  Laboratory Data: Lab Results  Component Value Date   WBC 7.7 04/20/2022   HGB 13.0 04/20/2022   HCT 39.6 04/20/2022   MCV 94.5 04/20/2022   PLT 161 04/20/2022    Lab Results  Component Value Date   CREATININE 0.79 04/20/2022    No results found for: "PSA"  No results found for: "TESTOSTERONE"  Lab Results  Component Value Date   HGBA1C 5.6 03/12/2022    Urinalysis    Component Value Date/Time   COLORURINE AMBER (A) 04/20/2022 1740   APPEARANCEUR CLOUDY (A) 04/20/2022 1740   APPEARANCEUR Cloudy (A) 11/13/2021 1447   LABSPEC 1.017 04/20/2022 1740   PHURINE 6.0 04/20/2022 1740   GLUCOSEU NEGATIVE 04/20/2022 1740   HGBUR LARGE (A) 04/20/2022 1740   BILIRUBINUR NEGATIVE 04/20/2022 1740   BILIRUBINUR Negative 11/13/2021 1447   KETONESUR NEGATIVE 04/20/2022 1740   PROTEINUR 100 (A) 04/20/2022 1740   UROBILINOGEN 1.0 06/07/2012 1313   NITRITE NEGATIVE 04/20/2022 1740   LEUKOCYTESUR MODERATE (A) 04/20/2022 1740    Lab Results  Component Value Date   LABMICR See below: 11/13/2021   WBCUA >30 (A) 11/13/2021   LABEPIT 0-10 11/13/2021   BACTERIA FEW (A) 04/20/2022    Pertinent Imaging:  No results found for this or any previous visit.  No results found for this or any previous visit.  No results found for this or any previous visit.  No results found for this or any previous visit.  Results for orders placed during the hospital encounter of 04/18/21  US  RENAL  Narrative CLINICAL DATA:  AK I, chronic Foley catheter.  EXAM: RENAL / URINARY TRACT ULTRASOUND COMPLETE  COMPARISON:  CT April 19, 2021  FINDINGS: Right Kidney:  Renal measurements: 12.1 x 7.7 x 6.6 cm = volume: 324 mL. Echogenicity within normal limits. No mass or hydronephrosis visualized.  Left Kidney:  Renal measurements: 12.4 x 6.9 x 6.4 cm = volume: 287 mL. Echogenicity within normal limits. No mass or hydronephrosis visualized.  Bladder:  Decompressed around a Foley catheter.  Other:  None.  IMPRESSION: NO hydronephrosis.   Electronically Signed By: Maudry Mayhew M.D. On: 04/20/2021 16:17  No valid procedures specified. No results found for this or any previous visit.  Results for orders placed during the hospital encounter of 04/02/22  CT Renal  Stone Study  Narrative CLINICAL DATA:  Abdominal/flank pain, stone suspected worsening lower abd / penis pain, indwelling cath  EXAM: CT ABDOMEN AND PELVIS WITHOUT CONTRAST  TECHNIQUE: Multidetector CT imaging of the abdomen and pelvis was performed following the standard protocol without IV contrast.  RADIATION DOSE REDUCTION: This exam was performed according to the departmental dose-optimization program which includes automated exposure control, adjustment of the mA and/or kV according to patient size and/or use of iterative reconstruction technique.  COMPARISON:  None Available.  FINDINGS: Lower chest: No acute abnormality.  Hepatobiliary: Cholelithiasis without evidence of acute inflammatory change. Unremarkable appearance of the liver. No evidence of biliary dilation.  Pancreas: Unremarkable. No pancreatic ductal dilatation or surrounding inflammatory changes.  Spleen: Normal in size without focal abnormality.  Adrenals/Urinary Tract: Adrenal glands are unremarkable. Kidneys are normal, without renal calculi, focal lesion, or hydronephrosis. Bladder is decompressed around a  Foley catheter. Small contained fluid collection versus diverticulum arising from the bladder is unchanged.  Stomach/Bowel: Unremarkable appearance of the stomach. No dilated bowel loops to suggest obstruction. Diverticulosis without evidence diverticulitis. No findings to suggest appendicitis.  Vascular/Lymphatic: Aortic atherosclerosis. No enlarged abdominal or pelvic lymph nodes.  Reproductive: Prostate is unremarkable.  Other: Unchanged umbilical hernia which contains fat. No abdominopelvic ascites.  Musculoskeletal: Multilevel degenerative change.  IMPRESSION: 1. No evidence of acute abnormality. 2. Cholelithiasis. 3. Diverticulosis. 4. Small contained fluid collection versus diverticulum arising from the bladder is unchanged.   Electronically Signed By: Feliberto Harts M.D. On: 04/02/2022 13:32   Assessment & Plan:    1. Urinary retention Continue chronic foley  2. Acute cystitis Urine for culture Macrobid 100mg  BID for 7 days   No follow-ups on file.  Wilkie Aye, MD  [facility] [place]

## 2022-12-24 ENCOUNTER — Encounter: Payer: Self-pay | Admitting: Urology

## 2022-12-24 NOTE — Patient Instructions (Signed)
Urinary Tract Infection, Adult  A urinary tract infection (UTI) is an infection of any part of the urinary tract. The urinary tract includes the kidneys, ureters, bladder, and urethra. These organs make, store, and get rid of urine in the body. An upper UTI affects the ureters and kidneys. A lower UTI affects the bladder and urethra. What are the causes? Most urinary tract infections are caused by bacteria in your genital area around your urethra, where urine leaves your body. These bacteria grow and cause inflammation of your urinary tract. What increases the risk? You are more likely to develop this condition if: You have a urinary catheter that stays in place. You are not able to control when you urinate or have a bowel movement (incontinence). You are male and you: Use a spermicide or diaphragm for birth control. Have low estrogen levels. Are pregnant. You have certain genes that increase your risk. You are sexually active. You take antibiotic medicines. You have a condition that causes your flow of urine to slow down, such as: An enlarged prostate, if you are male. Blockage in your urethra. A kidney stone. A nerve condition that affects your bladder control (neurogenic bladder). Not getting enough to drink, or not urinating often. You have certain medical conditions, such as: Diabetes. A weak disease-fighting system (immunesystem). Sickle cell disease. Gout. Spinal cord injury. What are the signs or symptoms? Symptoms of this condition include: Needing to urinate right away (urgency). Frequent urination. This may include small amounts of urine each time you urinate. Pain or burning with urination. Blood in the urine. Urine that smells bad or unusual. Trouble urinating. Cloudy urine. Vaginal discharge, if you are male. Pain in the abdomen or the lower back. You may also have: Vomiting or a decreased appetite. Confusion. Irritability or tiredness. A fever or  chills. Diarrhea. The first symptom in older adults may be confusion. In some cases, they may not have any symptoms until the infection has worsened. How is this diagnosed? This condition is diagnosed based on your medical history and a physical exam. You may also have other tests, including: Urine tests. Blood tests. Tests for STIs (sexually transmitted infections). If you have had more than one UTI, a cystoscopy or imaging studies may be done to determine the cause of the infections. How is this treated? Treatment for this condition includes: Antibiotic medicine. Over-the-counter medicines to treat discomfort. Drinking enough water to stay hydrated. If you have frequent infections or have other conditions such as a kidney stone, you may need to see a health care provider who specializes in the urinary tract (urologist). In rare cases, urinary tract infections can cause sepsis. Sepsis is a life-threatening condition that occurs when the body responds to an infection. Sepsis is treated in the hospital with IV antibiotics, fluids, and other medicines. Follow these instructions at home:  Medicines Take over-the-counter and prescription medicines only as told by your health care provider. If you were prescribed an antibiotic medicine, take it as told by your health care provider. Do not stop using the antibiotic even if you start to feel better. General instructions Make sure you: Empty your bladder often and completely. Do not hold urine for long periods of time. Empty your bladder after sex. Wipe from front to back after urinating or having a bowel movement if you are male. Use each tissue only one time when you wipe. Drink enough fluid to keep your urine pale yellow. Keep all follow-up visits. This is important. Contact a health   care provider if: Your symptoms do not get better after 1-2 days. Your symptoms go away and then return. Get help right away if: You have severe pain in  your back or your lower abdomen. You have a fever or chills. You have nausea or vomiting. Summary A urinary tract infection (UTI) is an infection of any part of the urinary tract, which includes the kidneys, ureters, bladder, and urethra. Most urinary tract infections are caused by bacteria in your genital area. Treatment for this condition often includes antibiotic medicines. If you were prescribed an antibiotic medicine, take it as told by your health care provider. Do not stop using the antibiotic even if you start to feel better. Keep all follow-up visits. This is important. This information is not intended to replace advice given to you by your health care provider. Make sure you discuss any questions you have with your health care provider. Document Revised: 08/22/2019 Document Reviewed: 08/27/2019 Elsevier Patient Education  2024 Elsevier Inc.  

## 2022-12-27 LAB — URINE CULTURE

## 2022-12-30 ENCOUNTER — Encounter: Payer: No Typology Code available for payment source | Admitting: Family Medicine

## 2022-12-31 ENCOUNTER — Ambulatory Visit (INDEPENDENT_AMBULATORY_CARE_PROVIDER_SITE_OTHER): Payer: No Typology Code available for payment source

## 2022-12-31 DIAGNOSIS — I442 Atrioventricular block, complete: Secondary | ICD-10-CM

## 2023-01-01 ENCOUNTER — Telehealth: Payer: Self-pay | Admitting: *Deleted

## 2023-01-01 ENCOUNTER — Ambulatory Visit (INDEPENDENT_AMBULATORY_CARE_PROVIDER_SITE_OTHER): Payer: No Typology Code available for payment source | Admitting: Internal Medicine

## 2023-01-01 ENCOUNTER — Encounter: Payer: Self-pay | Admitting: Internal Medicine

## 2023-01-01 VITALS — BP 125/75 | HR 70 | Temp 97.7°F | Ht 72.0 in | Wt 286.6 lb

## 2023-01-01 DIAGNOSIS — K6289 Other specified diseases of anus and rectum: Secondary | ICD-10-CM | POA: Diagnosis not present

## 2023-01-01 DIAGNOSIS — Z7901 Long term (current) use of anticoagulants: Secondary | ICD-10-CM

## 2023-01-01 DIAGNOSIS — R159 Full incontinence of feces: Secondary | ICD-10-CM | POA: Diagnosis not present

## 2023-01-01 DIAGNOSIS — R151 Fecal smearing: Secondary | ICD-10-CM

## 2023-01-01 LAB — CUP PACEART REMOTE DEVICE CHECK
Battery Remaining Longevity: 91 mo
Battery Remaining Percentage: 89 %
Battery Voltage: 3.02 V
Brady Statistic AP VP Percent: 0 %
Brady Statistic AP VS Percent: 0 %
Brady Statistic AS VP Percent: 0 %
Brady Statistic AS VS Percent: 0 %
Brady Statistic RA Percent Paced: 0 %
Brady Statistic RV Percent Paced: 94 %
Date Time Interrogation Session: 20241203040016
Implantable Lead Connection Status: 753985
Implantable Lead Connection Status: 753985
Implantable Lead Implant Date: 20231204
Implantable Lead Implant Date: 20231204
Implantable Lead Location: 753859
Implantable Lead Location: 753860
Implantable Pulse Generator Implant Date: 20231204
Lead Channel Impedance Value: 360 Ohm
Lead Channel Impedance Value: 440 Ohm
Lead Channel Pacing Threshold Amplitude: 0.75 V
Lead Channel Pacing Threshold Amplitude: 0.75 V
Lead Channel Pacing Threshold Pulse Width: 0.5 ms
Lead Channel Pacing Threshold Pulse Width: 0.5 ms
Lead Channel Sensing Intrinsic Amplitude: 1.4 mV
Lead Channel Sensing Intrinsic Amplitude: 8.6 mV
Lead Channel Setting Pacing Amplitude: 2 V
Lead Channel Setting Pacing Amplitude: 2.5 V
Lead Channel Setting Pacing Pulse Width: 0.5 ms
Lead Channel Setting Sensing Sensitivity: 0.5 mV
Pulse Gen Model: 2272
Pulse Gen Serial Number: 8111795

## 2023-01-01 NOTE — Progress Notes (Signed)
Primary Care Physician:  Toma Deiters, MD Primary Gastroenterologist:  Dr. Marletta Lor  Chief Complaint  Patient presents with   fecal leakage    Referred for fecal leakage.     HPI:   Mario Massey is a 77 y.o. male who presents the clinic today by referral from the [place] for fecal incontinence.  Patient has history of dementia, a flutter status post permanent pacemaker insertion, chronically on Coumadin, ascending aortic dilatation, artery disease.  Given patient's underlying dementia, majority of HPI taken from daughter who is his primary caretaker and POA who is with him today.  She reports patient has had issues with stool leakage/fecal incontinence for years.  Notes that she does patient's laundry and all of his underwear will have stool stains.  She has not seen any blood.  Patient reports that he does not have issues with his bowel movements.  Denies any constipation, no chronic diarrhea.    She reports patient was previously seen by GI 2 to 3 years ago who recommended fiber therapy among a few other recommendations including stopping alcohol consumption.  Patient did not try any of this.  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  Reports last colonoscopy 4 to 5 years ago at the [place] though I do not have access to this report.  Patient's daughter is unsure about family history of colorectal malignancy.   Past Medical History:  Diagnosis Date   Ascending aorta dilation (HCC)    Atrial flutter (HCC)    AV dissociation    Bipolar disorder (HCC)    Coronary artery disease    Dementia [facility])    daughter states he asks alot of questions when he is nervous.   Depression    Essential hypertension    Foley catheter in place    replaced July 2024   Hyperlipidemia LDL goal <70    Hypertension    Myocardial infarction [facility])    Presence of permanent cardiac pacemaker    RBBB    Second degree AV block, Mobitz type I     Past Surgical History:  Procedure Laterality Date    CYSTOSCOPY N/A 04/15/2022   Procedure: CYSTOSCOPY;  Surgeon: Malen Gauze, MD;  Location: [facility];  Service: Urology;  Laterality: N/A;   KNEE SURGERY     with replacement on right knee   LEFT HEART CATH AND CORONARY ANGIOGRAPHY N/A 07/18/2021   Procedure: LEFT HEART CATH AND CORONARY ANGIOGRAPHY;  Surgeon: Tonny Bollman, MD;  Location: [facility] INVASIVE CV LAB;  Service: Cardiovascular;  Laterality: N/A;   PACEMAKER IMPLANT N/A 12/31/2021   Procedure: PACEMAKER IMPLANT;  Surgeon: Marinus Maw, MD;  Location: [facility] INVASIVE CV LAB;  Service: Cardiovascular;  Laterality: N/A;   TRANSURETHRAL RESECTION OF PROSTATE N/A 04/15/2022   Procedure: TRANSURETHRAL RESECTION OF THE PROSTATE (TURP);  Surgeon: Malen Gauze, MD;  Location: [facility];  Service: Urology;  Laterality: N/A;    Current Outpatient Medications  Medication Sig Dispense Refill   AMBULATORY NON FORMULARY MEDICATION Medication Name: Depend briefs 2 Package 12   atorvastatin (LIPITOR) 40 MG tablet Take 1 tablet (40 mg total) by mouth daily. Please call [phone] to schedule an appointment with Dr. Zoila Shutter for future refills. Thank you. 90 tablet 3   benazepril (LOTENSIN) 10 MG tablet Take 10 mg by mouth daily.     Calcium Carbonate (CALCIUM 600 PO) Take by mouth. One daily     divalproex (DEPAKOTE ER) 250 MG 24 hr tablet  Take 250 mg by mouth daily.     Multiple Vitamin (MULTIVITAMIN WITH MINERALS) TABS tablet Take 1 tablet by mouth daily.     nitroGLYCERIN (NITROSTAT) 0.4 MG SL tablet Place 1 tablet (0.4 mg total) under the tongue every 5 (five) minutes as needed for chest pain. 25 tablet 3   tadalafil (CIALIS) 5 MG tablet Take 5 mg by mouth daily.     venlafaxine XR (EFFEXOR-XR) 150 MG 24 hr capsule Take 1 capsule (150 mg total) by mouth daily. 30 capsule 0   warfarin (COUMADIN) 5 MG tablet Take 1 tablet (5 mg total) by mouth daily. 30 tablet 3   No current facility-administered medications for this visit.    Allergies  as of 01/01/2023   (No Known Allergies)    Family History  Problem Relation Age of Onset   Alzheimer's disease Neg Hx    Parkinson's disease Neg Hx     Social History   Socioeconomic History   Marital status: Widowed    Spouse name: Not on file   Number of children: Not on file   Years of education: Not on file   Highest education level: Not on file  Occupational History   Not on file  Tobacco Use   Smoking status: Never    Passive exposure: Never   Smokeless tobacco: Never  Vaping Use   Vaping status: Never Used  Substance and Sexual Activity   Alcohol use: Never    Comment: occasional   Drug use: No   Sexual activity: Not Currently  Other Topics Concern   Not on file  Social History Narrative   Lives in his home on daughter's property    Social Determinants of Health   Financial Resource Strain: Not on file  Food Insecurity: No Food Insecurity (04/15/2022)   Hunger Vital Sign    Worried About Running Out of Food in the Last Year: Never true    Ran Out of Food in the Last Year: Never true  Transportation Needs: No Transportation Needs (04/15/2022)   PRAPARE - Administrator, Civil Service (Medical): No    Lack of Transportation (Non-Medical): No  Physical Activity: Not on file  Stress: Not on file  Social Connections: Not on file  Intimate Partner Violence: Not At Risk (04/15/2022)   Humiliation, Afraid, Rape, and Kick questionnaire    Fear of Current or Ex-Partner: No    Emotionally Abused: No    Physically Abused: No    Sexually Abused: No    Subjective: Review of Systems  Constitutional:  Negative for chills and fever.  HENT:  Negative for congestion and hearing loss.   Eyes:  Negative for blurred vision and double vision.  Respiratory:  Negative for cough and shortness of breath.   Cardiovascular:  Negative for chest pain and palpitations.  Gastrointestinal:  Negative for abdominal pain, blood in stool, constipation, diarrhea, heartburn,  melena and vomiting.       Fecal incontinence, rectal pain  Genitourinary:  Negative for dysuria and urgency.  Musculoskeletal:  Negative for joint pain and myalgias.  Skin:  Negative for itching and rash.  Neurological:  Negative for dizziness and headaches.  Psychiatric/Behavioral:  Negative for depression. The patient is not nervous/anxious.        Objective: BP 125/75 (BP Location: Left Arm, Patient Position: Sitting, Cuff Size: Large)   Pulse 70   Temp 97.7 F (36.5 C) (Oral)   Ht 6' (1.829 m)   Wt 286 lb 9.6  oz (130 kg)   BMI 38.87 kg/m  Physical Exam Constitutional:      Appearance: Normal appearance.  HENT:     Head: Normocephalic and atraumatic.  Eyes:     Extraocular Movements: Extraocular movements intact.     Conjunctiva/sclera: Conjunctivae normal.  Cardiovascular:     Rate and Rhythm: Normal rate and regular rhythm.  Pulmonary:     Effort: Pulmonary effort is normal.     Breath sounds: Normal breath sounds.  Abdominal:     General: Bowel sounds are normal.     Palpations: Abdomen is soft.  Musculoskeletal:        General: Normal range of motion.     Cervical back: Normal range of motion and neck supple.  Skin:    General: Skin is warm.  Neurological:     General: No focal deficit present.     Mental Status: He is alert and oriented to person, place, and time.  Psychiatric:        Mood and Affect: Mood normal.        Behavior: Behavior normal.   Rectal exam: Moderate amount of stool perianally.  Cleaned with tissue paper.  No obvious anal fissure.  On digital rectal exam, mildly lax anal sphincter, internal hemorrhoids felt.  No masses.   Assessment: *Stool incontinence, fecal seepage *Rectal pain *Systemic anticoagulation with Coumadin. *Anal sphincter incompetence  Plan: Discussed in depth with patient and daughter today.  Recommended adding fiber therapy.  Also recommended Kegel exercises to strengthen pelvic floor.  Discussed possibility of  colonoscopy to further evaluate.  Patient's daughter Melina Modena) is adamant that he would not tolerate prep.  Discussed possibility of flexible sigmoidoscopy as this would not require full preparation but rather enemas.  She states she would like to have this done though patient would need to receive his enemas at the hospital which we will try to arrange.  He will need to hold his Coumadin x 5 days.  Will reach out to his cardiologist in this regard.  Discussed slight increased risk of cardiovascular event during this time patient's daughter and patient are agreeable.  Consider hemorrhoid banding depending on findings.  I will request colonoscopy report from [facility].   01/01/2023 1:48 PM   Disclaimer: This note was dictated with voice recognition software. Similar sounding words can inadvertently be transcribed and may not be corrected upon review.

## 2023-01-01 NOTE — Telephone Encounter (Signed)
  Request for patient to stop medication prior to procedure or is needing cleareance  01/01/23  Mario Massey [birth year]  What type of surgery is being performed? Flexible Sigmoidoscopy  When is surgery scheduled? TBD  What type of clearance is required (medical or pharmacy to hold medication or both? Coumadin  Are there any medications that need to be held prior to surgery and how long? x5 days  Name of physician performing surgery?  Dr. Earnest Bailey [facility] at [facility] Phone: [phone] Fax: [phone]  Anethesia type (none, local, MAC, general)? MAC

## 2023-01-01 NOTE — Patient Instructions (Signed)
We will schedule you for a flexible sigmoidoscopy to further evaluate your symptoms.  You will need to hold your Coumadin x 5 days prior to procedure.  We will reach out to your cardiologist to ensure this is okay.  You will need to complete enemas prior to procedure.  We will try to arrange to have this done at the hospital before your procedure.  Recommend starting fiber therapy such as Metamucil or Benefiber 1-2 times daily.  Recommend Kegel's exercises at home to strengthen your pelvic floor.  If flexible sigmoidoscopy shows internal hemorrhoids, we can consider banding.  I am going to request your colonoscopy report from the [place].  It was very nice meeting both you today.  Dr. Marletta Lor

## 2023-01-01 NOTE — Telephone Encounter (Signed)
Pharmacy, can you please comment on how long Coumadin can be held prior to upcoming procedure?  Thank you!

## 2023-01-03 NOTE — Telephone Encounter (Signed)
     Primary Cardiologist: Dr. Ladona Ridgel  Chart reviewed as part of pre-operative protocol coverage by clinical pharmacist.  The following recommendations were made for Mario Massey   Patient with diagnosis of atrial flutter  on warfarin for anticoagulation.     Procedure: Flexible Sigmoidoscopy Date of procedure: TBD     CHA2DS2-VASc Score = 4   This indicates a 4.8% annual risk of stroke. The patient's score is based upon: CHF History: 0 HTN History: 1 Diabetes History: 0 Stroke History: 0 Vascular Disease History: 1 Age Score: 2 Gender Score: 0       CrCl >100 mL/min (SrCr 0.83 on 10/31/2022) Platelet count 161 K (04/20/2022)       Per office protocol, patient can hold Warfarin  for 5 days prior to procedure.   Patient will not need bridging with Lovenox (enoxaparin) around procedure.  I will route this recommendation to the requesting party via Epic fax function and remove from pre-op pool.  Please call with questions.  Thomasene Ripple. Deondray Ospina NP-C     01/03/2023, 8:40 AM [facility] [address] Office [phone] Fax [phone]

## 2023-01-03 NOTE — Telephone Encounter (Signed)
Patient with diagnosis of atrial flutter  on warfarin for anticoagulation.    Procedure: Flexible Sigmoidoscopy Date of procedure: TBD   CHA2DS2-VASc Score = 4   This indicates a 4.8% annual risk of stroke. The patient's score is based upon: CHF History: 0 HTN History: 1 Diabetes History: 0 Stroke History: 0 Vascular Disease History: 1 Age Score: 2 Gender Score: 0     CrCl >100 mL/min (SrCr 0.83 on 10/31/2022) Platelet count 161 K (04/20/2022)    Per office protocol, patient can hold Warfarin  for 5 days prior to procedure.   Patient will not need bridging with Lovenox (enoxaparin) around procedure.  **This guidance is not considered finalized until pre-operative APP has relayed final recommendations.**

## 2023-01-08 NOTE — Telephone Encounter (Signed)
Okay to schedule.  Thank you

## 2023-01-09 NOTE — Telephone Encounter (Signed)
Daughter called back and scheduled for 1/6. Daughter said patient is very demented and confused and since he can't have anything after midnight prior he may be very combative and verbal about things he shouldn't. She said to just over look the things he may say due to his mental state. She is aware he needs to take 2 enemas morning of procedure and the nurses will administer this. Will call back with pre-op appointment.  Just FYI Dr. Marletta Lor.

## 2023-01-09 NOTE — Telephone Encounter (Signed)
Lmovm to call back. Also patient will need 2 enemas and will take this with him to hospital when he arrives

## 2023-01-10 NOTE — Telephone Encounter (Signed)
Called daughter and she is aware of pre-op appt details

## 2023-01-16 ENCOUNTER — Ambulatory Visit: Payer: No Typology Code available for payment source

## 2023-01-17 ENCOUNTER — Ambulatory Visit: Payer: No Typology Code available for payment source

## 2023-01-17 ENCOUNTER — Other Ambulatory Visit: Payer: Self-pay

## 2023-01-17 DIAGNOSIS — R339 Retention of urine, unspecified: Secondary | ICD-10-CM

## 2023-01-17 DIAGNOSIS — Z87440 Personal history of urinary (tract) infections: Secondary | ICD-10-CM

## 2023-01-17 DIAGNOSIS — N390 Urinary tract infection, site not specified: Secondary | ICD-10-CM

## 2023-01-17 LAB — URINALYSIS, ROUTINE W REFLEX MICROSCOPIC
Bilirubin, UA: NEGATIVE
Glucose, UA: NEGATIVE
Ketones, UA: NEGATIVE
Nitrite, UA: NEGATIVE
Specific Gravity, UA: 1.03 (ref 1.005–1.030)
Urobilinogen, Ur: 1 mg/dL (ref 0.2–1.0)
pH, UA: 6 (ref 5.0–7.5)

## 2023-01-17 LAB — MICROSCOPIC EXAMINATION
RBC, Urine: >30 /[HPF] — AB (ref 0–2)
WBC, UA: >30 /[HPF] — AB (ref 0–5)

## 2023-01-17 MED ORDER — NITROFURANTOIN MONOHYD MACRO 100 MG PO CAPS: 100.0000 mg | ORAL_CAPSULE | Freq: Two times a day (BID) | ORAL | 0 refills | Status: DC

## 2023-01-17 NOTE — Progress Notes (Signed)
Cath Change/ Replacement  Patient is present today for a catheter change due to urinary retention.  20ml of water was removed from the balloon, a 20 Coude cath was removed without difficulty.  Patient was cleaned and prepped in a sterile fashion with betadine and 2% lidocaine jelly was instilled into the urethra. A 20 Coude cath was replaced into the bladder, no complications were noted. Urine return was noted 25ml and urine was yellow in color. The balloon was filled with 10ml of sterile water. A leg bag was attached for drainage.  Patient was given proper instruction on catheter care.    Performed by: Gwendolyn Grant CMA  Follow up: As scheduled

## 2023-01-17 NOTE — Addendum Note (Signed)
Addended by: Grier Rocher on: 01/17/2023 12:50 PM   Modules accepted: Orders

## 2023-01-22 LAB — URINE CULTURE

## 2023-01-24 HISTORY — PX: OTHER SURGICAL HISTORY: SHX169

## 2023-01-27 ENCOUNTER — Telehealth: Payer: Self-pay | Admitting: *Deleted

## 2023-01-27 NOTE — Telephone Encounter (Signed)
Spoke to pts daughter Lilyan Punt (on [place]) and she states pt has taken a turn for the worse and doesn't want to put him through any procedures at this time. She didn't want to reschedule at this time.   Message Received: 3 days ago Letta Median, Ewell Poe, CMA; Elinor Dodge, LPN; Marlowe Shores, LPN This patient's wife LM to cancel.  I have put in depot and will hold until the end of the day.

## 2023-01-28 ENCOUNTER — Encounter (HOSPITAL_COMMUNITY): Payer: No Typology Code available for payment source

## 2023-01-28 NOTE — Telephone Encounter (Signed)
 Noted , thank you for letting me know

## 2023-02-03 ENCOUNTER — Ambulatory Visit (HOSPITAL_COMMUNITY)
Admission: RE | Admit: 2023-02-03 | Payer: No Typology Code available for payment source | Source: Home / Self Care | Admitting: Internal Medicine

## 2023-02-03 ENCOUNTER — Encounter (HOSPITAL_COMMUNITY): Admission: RE | Payer: Self-pay | Source: Home / Self Care

## 2023-02-03 SURGERY — FLEXIBLE SIGMOIDOSCOPY
Anesthesia: Monitor Anesthesia Care

## 2023-02-14 ENCOUNTER — Ambulatory Visit: Payer: No Typology Code available for payment source

## 2023-03-03 ENCOUNTER — Encounter: Payer: No Typology Code available for payment source | Admitting: Neurology

## 2023-03-06 ENCOUNTER — Telehealth: Payer: Self-pay | Admitting: Neurology

## 2023-03-06 NOTE — Telephone Encounter (Signed)
 I returned call to daughter of pt. They requesting a virtual visit due to being home bound: barely walking, dementia progressed and they feel they cannot bring them into the office. I told them that would route to provider and see if they are agreeable to video visit

## 2023-03-06 NOTE — Telephone Encounter (Signed)
 Pt's daughter called wanting to know if the appt can be a VV appt. She states that the pt is pretty much home bound. But she states that the pt has "gone down hill" drastically since the last time he was seen. Please advise.

## 2023-03-12 NOTE — Telephone Encounter (Signed)
I spoke with the daughter. I let her know that Dr Frances Furbish has been out of the office and we were unable to see if VV is ok. She will be here tomorrow however. The daughter said that was fine and she will arrange for the patient to come into the office instead. Check in time 10:15 am. I asked out pt's CPAP. She states he never got it. They went to the [place] to get it but the patient's dementia was so bad he couldn't understand how to to use it. She said he has his own place and she is there most of the time but the problem is that patient will get up multiple times at night and won't understand how to remove/apply the mask. She thanked me for the call.

## 2023-03-13 ENCOUNTER — Ambulatory Visit: Payer: No Typology Code available for payment source | Admitting: Neurology

## 2023-03-13 ENCOUNTER — Encounter: Payer: Self-pay | Admitting: Neurology

## 2023-03-13 VITALS — BP 127/79 | HR 70 | Ht 70.0 in | Wt 278.0 lb

## 2023-03-13 DIAGNOSIS — F01518 Vascular dementia, unspecified severity, with other behavioral disturbance: Secondary | ICD-10-CM

## 2023-03-13 DIAGNOSIS — G4733 Obstructive sleep apnea (adult) (pediatric): Secondary | ICD-10-CM | POA: Diagnosis not present
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
## 2023-03-13 NOTE — Patient Instructions (Addendum)
It was nice to see you both again today. As discussed, we can consider another medication for your memory: Namenda (generic name: Memantine), starting at 5 mg twice daily with gradual buildup to 10 mg twice daily. Please note that side effects may include, but are not limited to: nausea, diarrhea, confusion, hallucination, headaches, personality changes.  For now, please continue with supportive care.  Please use your walker at all times.  You can follow-up with me as needed.  It may be worth talking to your new urologist about potentially converting your indwelling catheter to a suprapubic catheter (which does require a procedure).

## 2023-03-14 ENCOUNTER — Ambulatory Visit: Payer: No Typology Code available for payment source

## 2023-03-19 ENCOUNTER — Ambulatory Visit: Payer: No Typology Code available for payment source | Attending: Internal Medicine | Admitting: Internal Medicine

## 2023-03-19 ENCOUNTER — Other Ambulatory Visit (HOSPITAL_COMMUNITY)
Admission: RE | Admit: 2023-03-19 | Discharge: 2023-03-19 | Disposition: A | Discharge status: Home / Self Care | Payer: No Typology Code available for payment source | Source: Ambulatory Visit | Attending: Internal Medicine | Admitting: Internal Medicine

## 2023-03-19 ENCOUNTER — Encounter: Payer: Self-pay | Admitting: Internal Medicine

## 2023-03-19 VITALS — BP 120/78 | HR 70 | Ht 72.0 in | Wt 273.6 lb

## 2023-03-19 DIAGNOSIS — I4892 Unspecified atrial flutter: Secondary | ICD-10-CM | POA: Diagnosis present

## 2023-03-19 DIAGNOSIS — R0609 Other forms of dyspnea: Secondary | ICD-10-CM | POA: Diagnosis not present

## 2023-03-19 DIAGNOSIS — Z950 Presence of cardiac pacemaker: Secondary | ICD-10-CM | POA: Diagnosis not present

## 2023-03-19 LAB — BRAIN NATRIURETIC PEPTIDE: B Natriuretic Peptide: 48 pg/mL (ref 0.0–100.0)

## 2023-03-19 NOTE — Patient Instructions (Signed)
 Medication Instructions:  Your physician recommends that you continue on your current medications as directed. Please refer to the Current Medication list given to you today.  *If you need a refill on your cardiac medications before your next appointment, please call your pharmacy*   Lab Work: BNP- TODAY  If you have labs (blood work) drawn today and your tests are completely normal, you will receive your results only by: MyChart Message (if you have MyChart) OR A paper copy in the mail If you have any lab test that is abnormal or we need to change your treatment, we will call you to review the results.   Testing/Procedures: None   Follow-Up: At [facility], you and your health needs are our priority.  As part of our continuing mission to provide you with exceptional heart care, we have created designated Provider Care Teams.  These Care Teams include your primary Cardiologist (physician) and Advanced Practice Providers (APPs -  Physician Assistants and Nurse Practitioners) who all work together to provide you with the care you need, when you need it.  We recommend signing up for the patient portal called "MyChart".  Sign up information is provided on this After Visit Summary.  MyChart is used to connect with patients for Virtual Visits (Telemedicine).  Patients are able to view lab/test results, encounter notes, upcoming appointments, etc.  Non-urgent messages can be sent to your provider as well.   To learn more about what you can do with MyChart, go to [email or web address].    Your next appointment:   1 year(s)  Provider:   You may see Luane School, MD or one of the following Advanced Practice Providers on your designated Care Team:   [place], PA-C  Jacolyn Reedy, [place]     Other Instructions

## 2023-03-19 NOTE — Progress Notes (Signed)
 Cardiology Office Note  Date: 03/19/2023   ID: Mario Massey, DOB April 01, 1945, MRN [id number]  PCP:  Toma Deiters, MD  Cardiologist:  None Electrophysiologist:  Lewayne Bunting, MD   History of Present Illness: Mario Massey is a 78 y.o. 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  Accompanied by wife.  History is obtained by the wife and partially by the patient.  He is currently sedentary, has advanced dementia.  Wife and son helps him with daily activities.  No symptoms except chest pain when he was carrying heavy weight around 3 months ago.  No episodes since then.  No episodes prior to that that he can recall of.  He does have shortness of breath with very minimal activities according to the wife.  No syncope, dizziness or leg swelling.    Past Medical History:  Diagnosis Date   Ascending aorta dilation (HCC)    Atrial flutter (HCC)    AV dissociation    Bipolar disorder (HCC)    Coronary artery disease    Dementia [facility])    daughter states he asks alot of questions when he is nervous.   Depression    Essential hypertension    Foley catheter in place    replaced July 2024   Hyperlipidemia LDL goal <70    Hypertension    Myocardial infarction [facility])    Presence of permanent cardiac pacemaker    RBBB    Second degree AV block, Mobitz type I     Past Surgical History:  Procedure Laterality Date   CYSTOSCOPY N/A 04/15/2022   Procedure: CYSTOSCOPY;  Surgeon: Malen Gauze, MD;  Location: [facility];  Service: Urology;  Laterality: N/A;   KNEE SURGERY     with replacement on right knee   LEFT HEART CATH AND CORONARY ANGIOGRAPHY N/A 07/18/2021   Procedure: LEFT HEART CATH AND CORONARY ANGIOGRAPHY;  Surgeon: Tonny Bollman, MD;  Location: [facility]  INVASIVE CV LAB;  Service: Cardiovascular;  Laterality: N/A;   PACEMAKER IMPLANT N/A 12/31/2021   Procedure: PACEMAKER IMPLANT;  Surgeon: Marinus Maw, MD;  Location: [facility] INVASIVE CV LAB;  Service: Cardiovascular;  Laterality: N/A;   TRANSURETHRAL RESECTION OF PROSTATE N/A 04/15/2022   Procedure: TRANSURETHRAL RESECTION OF THE PROSTATE (TURP);  Surgeon: Malen Gauze, MD;  Location: [facility];  Service: Urology;  Laterality: N/A;   TURP in December 2024  01/24/2023   in [place]    Current Outpatient Medications  Medication Sig Dispense Refill   AMBULATORY NON FORMULARY MEDICATION Medication Name: Depend briefs 2 Package 12   atorvastatin (LIPITOR) 40 MG tablet Take 1 tablet (40 mg total) by mouth daily. Please call [phone] to schedule an appointment with Dr. Zoila Shutter for future refills. Thank you. (Patient not taking: Reported on 03/13/2023) 90 tablet 3   benazepril (LOTENSIN) 10 MG tablet Take 10 mg by mouth daily.     divalproex (DEPAKOTE ER) 250 MG 24 hr tablet Take 250 mg by mouth daily.     nitroGLYCERIN (NITROSTAT) 0.4 MG SL tablet Place 1 tablet (0.4 mg total) under the tongue every 5 (five) minutes as needed for chest pain. 25 tablet 3   venlafaxine XR (EFFEXOR-XR) 150 MG 24 hr capsule Take 1 capsule (150 mg total) by mouth  daily. 30 capsule 0   warfarin (COUMADIN) 5 MG tablet Take 1 tablet (5 mg total) by mouth daily. 30 tablet 3   No current facility-administered medications for this visit.   Allergies:  Patient has no known allergies.   Social History: The patient  reports that he has never smoked. He has never been exposed to tobacco smoke. He has never used smokeless tobacco. He reports that he does not drink alcohol and does not use drugs.   Family History: The patient's family history is not on file.   ROS:  Please see the history of present illness. Otherwise, complete review of systems is positive for none.  All other systems are reviewed and negative.    Physical Exam: VS:  There were no vitals taken for this visit., BMI There is no height or weight on file to calculate BMI.  Wt Readings from Last 3 Encounters:  03/13/23 278 lb (126.1 kg)  01/01/23 286 lb 9.6 oz (130 kg)  12/19/22 282 lb (127.9 kg)    General: Patient appears comfortable at rest. HEENT: Conjunctiva and lids normal, oropharynx clear with moist mucosa. Neck: Supple, no elevated JVP or carotid bruits, no thyromegaly. Lungs: Clear to auscultation, nonlabored breathing at rest. Cardiac: Regular rate and rhythm, no S3 or significant systolic murmur, no pericardial rub. Abdomen: Soft, nontender, no hepatomegaly, bowel sounds present, no guarding or rebound. Extremities: No pitting edema, distal pulses 2+. Skin: Warm and dry. Musculoskeletal: No kyphosis. Neuropsychiatric: Alert and oriented x3, affect grossly appropriate.  Recent Labwork: 04/20/2022: BUN 23; Creatinine, Ser 0.79; Hemoglobin 13.0; Platelets 161; Potassium 4.6; Sodium 137  No results found for: "CHOL", "TRIG", "HDL", "CHOLHDL", "VLDL", "LDLCALC", "LDLDIRECT"   Assessment and Plan:  DOE with minimal exertion: Obtain BNP.  If BNP elevated, will start p.o. Lasix 20 mg once daily and cut back on the dose of benazepril.  Patient's wife verbalized understanding of the plan.  Critical distal LAD stenosis, 75% on medical management: Had NSTEMI in 2023, LHC showed distal LAD stent 5% stenosis.  Medical management was recommended.  He had 1 episode of exertional angina around 3 months ago but cannot recall any prior episodes.  No recurrence since then.  SL NTG 0.4 mg as needed.  Permanent atrial flutter s/p PPM : Had CHB and underwent PPM placement.  Follows with Dr. Sharrell Ku.  EKG today showed underlying atrial flutter and ventricular paced rhythm.  Advanced dementia: Wife is a sole caretaker.  Son also helps.  OSA not on CPAP: Cannot follow instructions to wear CPAP.    Medication Adjustments/Labs and  Tests Ordered: Current medicines are reviewed at length with the patient today.  Concerns regarding medicines are outlined above.    Disposition:  Follow up 1 year.  Signed, Herbert Deaner, MD, 03/19/2023 9:42 AM    [facility] [address], [place], [place] [address]

## 2023-03-21 ENCOUNTER — Telehealth: Payer: Self-pay | Admitting: Internal Medicine

## 2023-03-21 NOTE — Telephone Encounter (Signed)
 The patient has been notified of the result and verbalized understanding.  All questions (if any) were answered. Nori Riis, RN 03/21/2023 10:28 AM

## 2023-03-21 NOTE — Telephone Encounter (Signed)
Patient is returning call in regards to results. Transferred to Santina Evans, Charity fundraiser.

## 2023-04-01 ENCOUNTER — Ambulatory Visit (INDEPENDENT_AMBULATORY_CARE_PROVIDER_SITE_OTHER): Payer: No Typology Code available for payment source

## 2023-04-01 DIAGNOSIS — I442 Atrioventricular block, complete: Secondary | ICD-10-CM

## 2023-04-03 ENCOUNTER — Encounter: Payer: Self-pay | Admitting: Internal Medicine

## 2023-04-03 LAB — CUP PACEART REMOTE DEVICE CHECK
Battery Remaining Longevity: 88 mo
Battery Remaining Percentage: 86 %
Battery Voltage: 3.02 V
Brady Statistic AP VP Percent: 0 %
Brady Statistic AP VS Percent: 0 %
Brady Statistic AS VP Percent: 0 %
Brady Statistic AS VS Percent: 0 %
Brady Statistic RA Percent Paced: 1 %
Brady Statistic RV Percent Paced: 95 %
Date Time Interrogation Session: 20250304040012
Implantable Lead Connection Status: 753985
Implantable Lead Connection Status: 753985
Implantable Lead Implant Date: 20231204
Implantable Lead Implant Date: 20231204
Implantable Lead Location: 753859
Implantable Lead Location: 753860
Implantable Pulse Generator Implant Date: 20231204
Lead Channel Impedance Value: 380 Ohm
Lead Channel Impedance Value: 440 Ohm
Lead Channel Pacing Threshold Amplitude: 0.75 V
Lead Channel Pacing Threshold Amplitude: 0.75 V
Lead Channel Pacing Threshold Pulse Width: 0.5 ms
Lead Channel Pacing Threshold Pulse Width: 0.5 ms
Lead Channel Sensing Intrinsic Amplitude: 1.4 mV
Lead Channel Sensing Intrinsic Amplitude: 8.6 mV
Lead Channel Setting Pacing Amplitude: 2 V
Lead Channel Setting Pacing Amplitude: 2.5 V
Lead Channel Setting Pacing Pulse Width: 0.5 ms
Lead Channel Setting Sensing Sensitivity: 0.5 mV
Pulse Gen Model: 2272
Pulse Gen Serial Number: 8111795

## 2023-04-11 ENCOUNTER — Ambulatory Visit: Payer: No Typology Code available for payment source

## 2023-05-06 NOTE — Addendum Note (Signed)
 Addended by: Geralyn Flash D on: 05/06/2023 01:01 PM   Modules accepted: Orders

## 2023-05-06 NOTE — Progress Notes (Signed)
 Remote pacemaker transmission.

## 2023-05-28 ENCOUNTER — Telehealth: Payer: Self-pay

## 2023-05-28 NOTE — Telephone Encounter (Signed)
 Pt is now in hospice and the [place] is no longer paying for remote monitoring. The pt daughter is sending the monitor back to [facility]. [employer]. I canceled all upcoming remote checks and  marked him inactive in [employer]. I also put a note in [employer].

## 2023-07-29 DEATH — deceased
